# Patient Record
Sex: Male | Born: 1940 | Race: White | Hispanic: No | Marital: Married | Smoking: Former smoker
Health system: Southern US, Community
[De-identification: ages and names within clinical notes are randomized; demographics above are authoritative.]

## PROBLEM LIST (undated history)

## (undated) DIAGNOSIS — I10 Essential (primary) hypertension: Secondary | ICD-10-CM

## (undated) DIAGNOSIS — E1140 Type 2 diabetes mellitus with diabetic neuropathy, unspecified: Secondary | ICD-10-CM

## (undated) DIAGNOSIS — C9590 Leukemia, unspecified not having achieved remission: Secondary | ICD-10-CM

## (undated) DIAGNOSIS — R339 Retention of urine, unspecified: Secondary | ICD-10-CM

## (undated) DIAGNOSIS — E785 Hyperlipidemia, unspecified: Secondary | ICD-10-CM

## (undated) DIAGNOSIS — A4902 Methicillin resistant Staphylococcus aureus infection, unspecified site: Secondary | ICD-10-CM

## (undated) HISTORY — DX: Retention of urine, unspecified: R33.9

## (undated) HISTORY — DX: Essential (primary) hypertension: I10

## (undated) HISTORY — PX: EYE SURGERY: SHX253

## (undated) HISTORY — PX: BACK SURGERY: SHX140

## (undated) HISTORY — DX: Type 2 diabetes mellitus with diabetic neuropathy, unspecified: E11.40

## (undated) HISTORY — PX: APPENDECTOMY: SHX54

## (undated) HISTORY — DX: Hyperlipidemia, unspecified: E78.5

## (undated) HISTORY — DX: Leukemia, unspecified not having achieved remission: C95.90

## (undated) HISTORY — PX: CHOLECYSTECTOMY: SHX55

## (undated) HISTORY — DX: Methicillin resistant Staphylococcus aureus infection, unspecified site: A49.02

---

## 2004-05-26 ENCOUNTER — Ambulatory Visit (HOSPITAL_COMMUNITY): Admission: RE | Admit: 2004-05-26 | Discharge: 2004-05-27 | Payer: Self-pay | Admitting: Ophthalmology

## 2012-11-29 DIAGNOSIS — N320 Bladder-neck obstruction: Secondary | ICD-10-CM | POA: Insufficient documentation

## 2013-01-20 DIAGNOSIS — J309 Allergic rhinitis, unspecified: Secondary | ICD-10-CM | POA: Insufficient documentation

## 2013-01-20 DIAGNOSIS — H3320 Serous retinal detachment, unspecified eye: Secondary | ICD-10-CM | POA: Insufficient documentation

## 2013-01-20 DIAGNOSIS — R32 Unspecified urinary incontinence: Secondary | ICD-10-CM | POA: Insufficient documentation

## 2013-01-20 DIAGNOSIS — F411 Generalized anxiety disorder: Secondary | ICD-10-CM | POA: Insufficient documentation

## 2013-01-20 DIAGNOSIS — G5600 Carpal tunnel syndrome, unspecified upper limb: Secondary | ICD-10-CM | POA: Insufficient documentation

## 2013-01-20 DIAGNOSIS — G2581 Restless legs syndrome: Secondary | ICD-10-CM | POA: Insufficient documentation

## 2013-01-20 DIAGNOSIS — E1142 Type 2 diabetes mellitus with diabetic polyneuropathy: Secondary | ICD-10-CM | POA: Insufficient documentation

## 2013-01-20 DIAGNOSIS — M47812 Spondylosis without myelopathy or radiculopathy, cervical region: Secondary | ICD-10-CM | POA: Insufficient documentation

## 2013-01-20 DIAGNOSIS — K219 Gastro-esophageal reflux disease without esophagitis: Secondary | ICD-10-CM | POA: Insufficient documentation

## 2013-01-20 DIAGNOSIS — K5730 Diverticulosis of large intestine without perforation or abscess without bleeding: Secondary | ICD-10-CM | POA: Insufficient documentation

## 2013-01-20 DIAGNOSIS — C9210 Chronic myeloid leukemia, BCR/ABL-positive, not having achieved remission: Secondary | ICD-10-CM | POA: Insufficient documentation

## 2013-07-31 DIAGNOSIS — R42 Dizziness and giddiness: Secondary | ICD-10-CM | POA: Insufficient documentation

## 2013-10-12 DIAGNOSIS — D638 Anemia in other chronic diseases classified elsewhere: Secondary | ICD-10-CM | POA: Insufficient documentation

## 2013-11-05 DIAGNOSIS — I10 Essential (primary) hypertension: Secondary | ICD-10-CM | POA: Insufficient documentation

## 2013-11-05 DIAGNOSIS — R609 Edema, unspecified: Secondary | ICD-10-CM | POA: Insufficient documentation

## 2014-01-11 DIAGNOSIS — R339 Retention of urine, unspecified: Secondary | ICD-10-CM

## 2014-01-11 HISTORY — DX: Retention of urine, unspecified: R33.9

## 2014-05-11 DIAGNOSIS — N400 Enlarged prostate without lower urinary tract symptoms: Secondary | ICD-10-CM | POA: Insufficient documentation

## 2014-07-26 DIAGNOSIS — I509 Heart failure, unspecified: Secondary | ICD-10-CM | POA: Insufficient documentation

## 2014-07-26 DIAGNOSIS — I2510 Atherosclerotic heart disease of native coronary artery without angina pectoris: Secondary | ICD-10-CM | POA: Insufficient documentation

## 2014-07-26 DIAGNOSIS — I214 Non-ST elevation (NSTEMI) myocardial infarction: Secondary | ICD-10-CM | POA: Insufficient documentation

## 2014-09-25 DIAGNOSIS — R296 Repeated falls: Secondary | ICD-10-CM | POA: Insufficient documentation

## 2014-11-05 DIAGNOSIS — M545 Low back pain, unspecified: Secondary | ICD-10-CM | POA: Insufficient documentation

## 2015-04-15 DIAGNOSIS — M5416 Radiculopathy, lumbar region: Secondary | ICD-10-CM | POA: Insufficient documentation

## 2015-05-13 DIAGNOSIS — Z794 Long term (current) use of insulin: Secondary | ICD-10-CM | POA: Insufficient documentation

## 2015-05-13 DIAGNOSIS — N183 Chronic kidney disease, stage 3 unspecified: Secondary | ICD-10-CM | POA: Insufficient documentation

## 2015-08-05 DIAGNOSIS — E559 Vitamin D deficiency, unspecified: Secondary | ICD-10-CM | POA: Insufficient documentation

## 2015-09-15 DIAGNOSIS — E1165 Type 2 diabetes mellitus with hyperglycemia: Secondary | ICD-10-CM | POA: Insufficient documentation

## 2015-09-15 DIAGNOSIS — Y92009 Unspecified place in unspecified non-institutional (private) residence as the place of occurrence of the external cause: Secondary | ICD-10-CM

## 2015-09-15 DIAGNOSIS — L03119 Cellulitis of unspecified part of limb: Secondary | ICD-10-CM | POA: Insufficient documentation

## 2015-09-15 DIAGNOSIS — M791 Myalgia, unspecified site: Secondary | ICD-10-CM | POA: Insufficient documentation

## 2015-09-15 DIAGNOSIS — W19XXXA Unspecified fall, initial encounter: Secondary | ICD-10-CM | POA: Insufficient documentation

## 2015-09-16 HISTORY — PX: INCISE AND DRAIN ABCESS: PRO64

## 2015-09-19 DIAGNOSIS — A4902 Methicillin resistant Staphylococcus aureus infection, unspecified site: Secondary | ICD-10-CM

## 2015-09-19 HISTORY — DX: Methicillin resistant Staphylococcus aureus infection, unspecified site: A49.02

## 2015-09-19 HISTORY — PX: INCISION AND DRAINAGE ABSCESS: SHX5864

## 2015-09-25 ENCOUNTER — Encounter: Payer: Self-pay | Admitting: Internal Medicine

## 2015-09-25 ENCOUNTER — Non-Acute Institutional Stay (SKILLED_NURSING_FACILITY): Payer: Medicare Other | Admitting: Internal Medicine

## 2015-09-25 DIAGNOSIS — C9210 Chronic myeloid leukemia, BCR/ABL-positive, not having achieved remission: Secondary | ICD-10-CM

## 2015-09-25 DIAGNOSIS — D638 Anemia in other chronic diseases classified elsewhere: Secondary | ICD-10-CM | POA: Diagnosis not present

## 2015-09-25 DIAGNOSIS — F411 Generalized anxiety disorder: Secondary | ICD-10-CM

## 2015-09-25 DIAGNOSIS — L02419 Cutaneous abscess of limb, unspecified: Secondary | ICD-10-CM | POA: Diagnosis not present

## 2015-09-25 DIAGNOSIS — E785 Hyperlipidemia, unspecified: Secondary | ICD-10-CM | POA: Diagnosis not present

## 2015-09-25 DIAGNOSIS — Y92009 Unspecified place in unspecified non-institutional (private) residence as the place of occurrence of the external cause: Secondary | ICD-10-CM

## 2015-09-25 DIAGNOSIS — N401 Enlarged prostate with lower urinary tract symptoms: Secondary | ICD-10-CM | POA: Diagnosis not present

## 2015-09-25 DIAGNOSIS — J301 Allergic rhinitis due to pollen: Secondary | ICD-10-CM

## 2015-09-25 DIAGNOSIS — A4902 Methicillin resistant Staphylococcus aureus infection, unspecified site: Secondary | ICD-10-CM

## 2015-09-25 DIAGNOSIS — E1165 Type 2 diabetes mellitus with hyperglycemia: Secondary | ICD-10-CM

## 2015-09-25 DIAGNOSIS — L03114 Cellulitis of left upper limb: Secondary | ICD-10-CM | POA: Diagnosis not present

## 2015-09-25 DIAGNOSIS — Z794 Long term (current) use of insulin: Secondary | ICD-10-CM

## 2015-09-25 DIAGNOSIS — K219 Gastro-esophageal reflux disease without esophagitis: Secondary | ICD-10-CM | POA: Diagnosis not present

## 2015-09-25 DIAGNOSIS — W19XXXA Unspecified fall, initial encounter: Secondary | ICD-10-CM

## 2015-09-25 DIAGNOSIS — N138 Other obstructive and reflux uropathy: Secondary | ICD-10-CM

## 2015-09-25 DIAGNOSIS — R079 Chest pain, unspecified: Secondary | ICD-10-CM | POA: Insufficient documentation

## 2015-09-25 DIAGNOSIS — Y92099 Unspecified place in other non-institutional residence as the place of occurrence of the external cause: Secondary | ICD-10-CM

## 2015-09-25 NOTE — Progress Notes (Signed)
Patient ID: Jerry [name], male   DOB: June 18, 1941, 75 y.o.   MRN: [id number]    HISTORY AND PHYSICAL   DATE: 09/25/15  Location:  [facility]    Place of Service: SNF (31)   Extended Emergency Contact Information Primary Emergency Contact: Losh,HELEN Address: [address]          [place],  [place] Home Phone: [phone] Relation: None  Advanced Directive information  FULL CODE  Chief Complaint  Patient presents with  . New Admit To SNF    HPI:  75 yo male seen today as a new admission into SNF following hospital stay at [facility] for left arm cellulitis/left forearm abscess, CLL, DM, CHF, CKD stage 3, fall at home, atypical CP, mild UTI. He had I&D x 2. Wound cx (+) MRSA and tx with IV vanco ---> po doxy x 2 weeks. Wound vac placed. ID was consulted. CP though to be demand ischemia. He was given 1 unit PRBCs for anemia. hgb 8.3 at d/c. UTI tx with macrodantin and was told he needs o/p urology f/u. He has CLL and oncology was consulted. Albumin 2.2 at d/c. Cr 0.87. brilinta stopped  He reports no f/c or arm pain. Taking all meds as ordered. He has acid reflux but states he prefers to take pepto bismul. No CP. No nursing issues. No falls.  DM - insulin dependent. CBG 235 today. No low BS reactions. He takes levemir and humalog SSI. Followed by Endo Dr Meredith Pel  HTN - BP controlled on metoprolol  CAD/hx CHF - stable on ASA, BB, statin, lasix with K+ supplement. Has not needed prn SLNTG. Followed by cardio at [facility] in [place]  Hyperlipidemia - stable on lipitor and fenofibrate  CLL - followed by oncology Dr Pearson Forster. Takes sprycel  Anemia of chronic disease - stable s/p 1 unit PRBC  CKD - last Cr nml  GERD - takes omeprazole  Seasonal allergy - takes singulair and has used flonase at home  Anxiety- stable on buspar  BPH - stable on proscar and flomax  Past Medical History  Diagnosis Date  . Urinary retention 01/11/2014  . Diabetes mellitus without complication  ([place])   . Hypertension   . Hyperlipidemia   . MRSA infection 09/19/2015    left arm  . Leukemia [facility])     Past Surgical History  Procedure Laterality Date  . Appendectomy    . Cholecystectomy    . Eye surgery    . Back surgery    . Incise and drain abcess Left 09/16/2015    forearm; debscess undere MAC; [facility]; Dr Curley Spice  . Incision and drainage abscess Left 09/19/15    forearm wound washout with pulavac at [facility] by Dr Curley Spice    No care team member to display  Social History   Social History  . Marital Status: Married    Spouse Name: N/A  . Number of Children: N/A  . Years of Education: N/A   Occupational History  . Not on file.   Social History Main Topics  . Smoking status: Former Research scientist (life sciences)  . Smokeless tobacco: Not on file  . Alcohol Use: No  . Drug Use: No  . Sexual Activity: Not on file   Other Topics Concern  . Not on file   Social History Narrative   Lives alone     reports that he has quit smoking. He does not have any smokeless tobacco history on file. He reports that he does  not drink alcohol or use illicit drugs.  Family History  Problem Relation Age of Onset  . Heart attack    . Diabetes    . Hypertension     No family status information on file.     There is no immunization history on file for this patient.  Allergies  Allergen Reactions  . Influenza Vaccines Hives  . Aspirin     Other reaction(s): DIZZINESS Other reaction(s): UNCONSCIOUSNESS 09/16/15 pt takes 81 mg aspirin at home.[name], RPh  . Cyclobenzaprine     Other reaction(s): HIVES  . Dextromethorphan   . Doxycycline     Other reaction(s): NAUSEA  . Flu Virus Vaccine   . Gabapentin     Other reaction(s): GI UPSET,DROWSINESS  . Glipizide     Other reaction(s): DIZZINESS  . Hydrocodone-Acetaminophen   . Iodinated Diagnostic Agents     Other reaction(s): NO ALLERGY  . Latex     Other reaction(s): NO ALLERGY  . Lisinopril     Other reaction(s):  COUGH  . Penicillins     Other reaction(s): PRURITUS, RASH Other reaction(s): HIVES  . Pregabalin     Other reaction(s): GI UPSET,DROWSINESS  . Ropinirole     Other reaction(s): NAUSEA  . Salicylates   . Sulfamethoxazole-Trimethoprim     Other reaction(s): NAUSEA  . Oxycodone-Acetaminophen Nausea Only    Other reaction(s): HIVES    Medications: Patient's Medications  New Prescriptions   No medications on file  Previous Medications   ACETAMINOPHEN (TYLENOL) 325 MG TABLET    Take 325 mg by mouth.   ASPIRIN EC 81 MG TABLET    Take 81 mg by mouth.   ATORVASTATIN (LIPITOR) 80 MG TABLET    Take 80 mg by mouth.   BUSPIRONE (BUSPAR) 5 MG TABLET       CETIRIZINE (ZYRTEC) 10 MG TABLET       DASATINIB (SPRYCEL) 50 MG TABLET    Take 50 mg by mouth.   DOXYCYCLINE (VIBRA-TABS) 100 MG TABLET    Take 100 mg by mouth.   FENOFIBRATE 160 MG TABLET       FINASTERIDE (PROSCAR) 5 MG TABLET    Take 5 mg by mouth.   FLUTICASONE (FLONASE) 50 MCG/ACT NASAL SPRAY       FUROSEMIDE (LASIX) 20 MG TABLET    Take 20 mg by mouth.   INSULIN DETEMIR (LEVEMIR) 100 UNIT/ML PEN       INSULIN LISPRO (HUMALOG) 100 UNIT/ML INJECTION    Inject into the skin.   METOPROLOL SUCCINATE (TOPROL-XL) 25 MG 24 HR TABLET       MONTELUKAST (SINGULAIR) 10 MG TABLET       NITROGLYCERIN (NITROSTAT) 0.4 MG SL TABLET    Place 0.4 mg under the tongue.   OMEPRAZOLE (PRILOSEC) 40 MG CAPSULE       POTASSIUM CHLORIDE SA (KLOR-CON M20) 20 MEQ TABLET    Take by mouth.   PROMETHAZINE (PHENERGAN) 25 MG TABLET    Take 25 mg by mouth.   TAMSULOSIN (FLOMAX) 0.4 MG CAPS CAPSULE    Take 0.4 mg by mouth.   VITAMIN D, ERGOCALCIFEROL, (DRISDOL) 50000 UNITS CAPS CAPSULE    Take by mouth.  Modified Medications   No medications on file  Discontinued Medications   No medications on file    Review of Systems  Skin: Positive for wound.  All other systems reviewed and are negative.   Filed Vitals:   09/25/15 1100  BP: 108/56  Pulse: 78  Temp: 97.3 F (36.3 C)  SpO2: 99%   There is no height or weight on file to calculate BMI.  Physical Exam  Constitutional: He is oriented to person, place, and time. He appears well-developed.  Frail appearing in NAD, sitting up in bed  HENT:  Mouth/Throat: Oropharynx is clear and moist.  Eyes: Pupils are equal, round, and reactive to light. No scleral icterus.  Neck: Neck supple. Carotid bruit is not present. No thyromegaly present.  Cardiovascular: Normal rate, regular rhythm, normal heart sounds and intact distal pulses.  Exam reveals no gallop and no friction rub.   No murmur heard. Trace LE edema b/l. No calf TTP  Pulmonary/Chest: Effort normal and breath sounds normal. He has no wheezes. He has no rales. He exhibits no tenderness.  Abdominal: Soft. Bowel sounds are normal. He exhibits no distension, no abdominal bruit, no pulsatile midline mass and no mass. There is tenderness (epigastric). There is no rebound and no guarding.  Musculoskeletal: He exhibits edema.  Lymphadenopathy:    He has no cervical adenopathy.  Neurological: He is alert and oriented to person, place, and time.  Skin: Skin is warm and dry. No rash noted.  Left forearm dressing c/d/i. Min redness at wound vac insertion site. No d/c. Wound vac suctioning  Psychiatric: He has a normal mood and affect. His behavior is normal. Judgment and thought content normal.     Labs reviewed: No results found for any previous visit.  No results found.   Assessment/Plan   ICD-9-CM ICD-10-CM   1. Abscess of forearm 682.3 L02.419   2. Cellulitis of left upper extremity 682.3 L03.114   3. Infection with methicillin-resistant Staphylococcus aureus 041.12 A49.02   4. Type 2 diabetes mellitus with hyperglycemia, with long-term current use of insulin (HCC) 250.00 E11.65    790.29 Z79.4    V58.67    5. Fall in home, initial encounter [phone] W19.XXXA    E849.0 Y92.099   6. Chronic myeloid leukemia (HCC) 205.10 C92.10    7. BPH (benign prostatic hypertrophy) with urinary obstruction 600.01 N40.1    599.69    8. Allergic rhinitis due to pollen 477.0 J30.1   9. Gastroesophageal reflux disease without esophagitis 530.81 K21.9   10. Diabetic polyneuropathy associated with type 2 diabetes mellitus (HCC) 250.60 E11.42    357.2    11. Generalized anxiety disorder 300.02 F41.1   12. Hyperlipidemia LDL goal <100 272.4 E78.5   13. CAD in native artery 414.01 I25.10     Add flonase to [facility] if not taking already  Check CBC , BMP  Cont current meds as ordered. Doxy to take BID x 2 weeks total  F/u with specialists as scheduled  Wound care as ordered. Wound vac mx  Contact isolation due to mrsa infection  PT/OT/ST as ordered  GOAL: short term rehab and d/c home when medically appropriate. Communicated with pt and nursing.  Will follow  Bertine Schlottman S. Perlie Gold  [facility] and [facility] [address] [place], [place] [address] [phone] Cell (Monday-Friday 8 AM - 5 PM) [phone] After 5 PM and follow prompts

## 2015-10-06 ENCOUNTER — Encounter: Payer: Self-pay | Admitting: Internal Medicine

## 2015-10-13 ENCOUNTER — Non-Acute Institutional Stay (SKILLED_NURSING_FACILITY): Payer: Medicare Other | Admitting: Internal Medicine

## 2015-10-13 ENCOUNTER — Encounter: Payer: Self-pay | Admitting: Internal Medicine

## 2015-10-13 DIAGNOSIS — J301 Allergic rhinitis due to pollen: Secondary | ICD-10-CM | POA: Diagnosis not present

## 2015-10-13 DIAGNOSIS — A4902 Methicillin resistant Staphylococcus aureus infection, unspecified site: Secondary | ICD-10-CM

## 2015-10-13 DIAGNOSIS — C9210 Chronic myeloid leukemia, BCR/ABL-positive, not having achieved remission: Secondary | ICD-10-CM | POA: Diagnosis not present

## 2015-10-13 DIAGNOSIS — F411 Generalized anxiety disorder: Secondary | ICD-10-CM | POA: Diagnosis not present

## 2015-10-13 DIAGNOSIS — K219 Gastro-esophageal reflux disease without esophagitis: Secondary | ICD-10-CM

## 2015-10-13 DIAGNOSIS — E1165 Type 2 diabetes mellitus with hyperglycemia: Secondary | ICD-10-CM | POA: Diagnosis not present

## 2015-10-13 DIAGNOSIS — L03114 Cellulitis of left upper limb: Secondary | ICD-10-CM | POA: Diagnosis not present

## 2015-10-13 DIAGNOSIS — W19XXXA Unspecified fall, initial encounter: Secondary | ICD-10-CM

## 2015-10-13 DIAGNOSIS — D638 Anemia in other chronic diseases classified elsewhere: Secondary | ICD-10-CM | POA: Diagnosis not present

## 2015-10-13 DIAGNOSIS — E785 Hyperlipidemia, unspecified: Secondary | ICD-10-CM | POA: Diagnosis not present

## 2015-10-13 DIAGNOSIS — N138 Other obstructive and reflux uropathy: Secondary | ICD-10-CM

## 2015-10-13 DIAGNOSIS — Z794 Long term (current) use of insulin: Secondary | ICD-10-CM | POA: Diagnosis not present

## 2015-10-13 DIAGNOSIS — N401 Enlarged prostate with lower urinary tract symptoms: Secondary | ICD-10-CM | POA: Diagnosis not present

## 2015-10-13 DIAGNOSIS — Y92009 Unspecified place in unspecified non-institutional (private) residence as the place of occurrence of the external cause: Secondary | ICD-10-CM

## 2015-10-13 DIAGNOSIS — Y92099 Unspecified place in other non-institutional residence as the place of occurrence of the external cause: Secondary | ICD-10-CM

## 2015-10-13 NOTE — Progress Notes (Signed)
Patient ID: Jerry [name], male   DOB: 1941-01-16, 75 y.o.   MRN: [id number]    DATE:  Location:  [facility]    Place of Service: SNF (31)   Extended Emergency Contact Information Primary Emergency Contact: Boxley,Helen Address: [address]          [place] [address] [place] Phone: [phone] Relation: Other Secondary Emergency Contact: Radabaugh,[name] Address: [place], [place] [address] [place] Phone: [phone] Mobile Phone: [phone] Relation: Son  Advanced Directive information    Chief Complaint  Patient presents with  . Discharge Note    HPI:  75 yo male seen today for d/c from SNF following short term rehab for  left arm cellulitis/left forearm abscess, CLL, DM, CHF, CKD stage 3, fall at home, atypical CP, mild UTI. He had x 2 I&D. He saw surgery today and wound vac d/c'd. He has f/u appt next week with surgery. He will need HH PT/OT and wound care. He is a candidate for PCS and form has been completed. DME needed: rollator. He is excited about going home. No nursing issues. No falls. He has tolerated PT/OT. He has been getting nutritional supplements. He does get an "upset stomach" at times. He completed ALL abx (doxy and macrodantin) prior to d/c from SNF  [place]: Wound cx (+) MRSA and tx with IV vanco ---> po doxy x 2 weeks. Wound vac placed. ID was consulted. CP though to be demand ischemia. He was given 1 unit PRBCs for anemia. hgb 8.3 at d/c. UTI tx with macrodantin and was told he needs o/p urology f/u. He has CLL and oncology was consulted. Albumin 2.2 at d/c. Cr 0.87. brilinta stopped  M - insulin dependent. CBG 235 today. No low BS reactions. He takes levemir and humalog SSI. Followed by Endo Dr Meredith Pel  HTN - BP controlled on metoprolol  CAD/hx CHF - stable on ASA, BB, statin, lasix with K+ supplement. Has not needed prn SLNTG. Followed by cardio  at [facility] in [place]  Hyperlipidemia - stable on lipitor and fenofibrate  CLL - followed by oncology Dr Pearson Forster. Takes sprycel  Anemia of chronic disease - stable s/p 1 unit PRBC  CKD - last Cr nml  GERD - takes omeprazole  Seasonal allergy - takes singulair and has used flonase at home  Anxiety- stable on buspar  BPH - stable on proscar and flomax  Past Medical History  Diagnosis Date  . Urinary retention 01/11/2014  . Diabetes mellitus without complication ([place])   . Hypertension   . Hyperlipidemia   . MRSA infection 09/19/2015    left arm  . Leukemia [facility])     Past Surgical History  Procedure Laterality Date  . Appendectomy    . Cholecystectomy    . Eye surgery    . Back surgery    . Incise and drain abcess Left 09/16/2015    forearm; debscess undere MAC; [facility]; Dr Curley Spice  . Incision and drainage abscess Left 09/19/15    forearm wound washout with pulavac at [facility] by Dr Curley Spice    No care team member to display  Social History   Social History  . Marital Status: Married    Spouse Name: N/A  . Number of Children: N/A  . Years of Education: N/A   Occupational History  . Not on file.  Social History Main Topics  . Smoking status: Former Research scientist (life sciences)  . Smokeless tobacco: Not on file  . Alcohol Use: No  . Drug Use: No  . Sexual Activity: Not on file   Other Topics Concern  . Not on file   Social History Narrative   Lives alone     reports that he has quit smoking. He does not have any smokeless tobacco history on file. He reports that he does not drink alcohol or use illicit drugs.   There is no immunization history on file for this patient.  Allergies  Allergen Reactions  . Influenza Vaccines Hives  . Aspirin     Other reaction(s): DIZZINESS Other reaction(s): UNCONSCIOUSNESS 09/16/15 pt takes 81 mg aspirin at home.[name], RPh  . Cyclobenzaprine     Other reaction(s): HIVES  . Dextromethorphan   . Doxycycline     Other  reaction(s): NAUSEA  . Flu Virus Vaccine   . Gabapentin     Other reaction(s): GI UPSET,DROWSINESS  . Glipizide     Other reaction(s): DIZZINESS  . Hydrocodone-Acetaminophen   . Iodinated Diagnostic Agents     Other reaction(s): NO ALLERGY  . Latex     Other reaction(s): NO ALLERGY  . Lisinopril     Other reaction(s): COUGH  . Penicillins     Other reaction(s): PRURITUS, RASH Other reaction(s): HIVES  . Pregabalin     Other reaction(s): GI UPSET,DROWSINESS  . Ropinirole     Other reaction(s): NAUSEA  . Salicylates   . Sulfamethoxazole-Trimethoprim     Other reaction(s): NAUSEA  . Oxycodone-Acetaminophen Nausea Only    Other reaction(s): HIVES    Medications: Patient's Medications  New Prescriptions   No medications on file  Previous Medications   ACETAMINOPHEN (TYLENOL) 325 MG TABLET    Take 325 mg by mouth.   ASPIRIN EC 81 MG TABLET    Take 81 mg by mouth.   ATORVASTATIN (LIPITOR) 80 MG TABLET    Take 80 mg by mouth.   BUSPIRONE (BUSPAR) 5 MG TABLET       CETIRIZINE (ZYRTEC) 10 MG TABLET       DASATINIB (SPRYCEL) 50 MG TABLET    Take 50 mg by mouth.   FENOFIBRATE 160 MG TABLET       FINASTERIDE (PROSCAR) 5 MG TABLET    Take 5 mg by mouth.   FLUTICASONE (FLONASE) 50 MCG/ACT NASAL SPRAY       FUROSEMIDE (LASIX) 20 MG TABLET    Take 20 mg by mouth.   INSULIN DETEMIR (LEVEMIR) 100 UNIT/ML PEN       INSULIN LISPRO (HUMALOG) 100 UNIT/ML INJECTION    Inject into the skin.   METOPROLOL SUCCINATE (TOPROL-XL) 25 MG 24 HR TABLET       MONTELUKAST (SINGULAIR) 10 MG TABLET       NITROGLYCERIN (NITROSTAT) 0.4 MG SL TABLET    Place 0.4 mg under the tongue.   OMEPRAZOLE (PRILOSEC) 40 MG CAPSULE       POTASSIUM CHLORIDE SA (KLOR-CON M20) 20 MEQ TABLET    Take by mouth.   PROMETHAZINE (PHENERGAN) 25 MG TABLET    Take 25 mg by mouth.   TAMSULOSIN (FLOMAX) 0.4 MG CAPS CAPSULE    Take 0.4 mg by mouth.   VITAMIN D, ERGOCALCIFEROL, (DRISDOL) 50000 UNITS CAPS CAPSULE    Take by mouth.    Modified Medications   No medications on file  Discontinued Medications   No medications on file    Review of  Systems  Gastrointestinal:       Indigestion  Musculoskeletal: Positive for arthralgias and gait problem.  Allergic/Immunologic: Positive for environmental allergies.  All other systems reviewed and are negative.   Filed Vitals:   10/13/15 1628  BP: 119/65  Pulse: 50  Temp: 97 F (36.1 C)   There is no height or weight on file to calculate BMI.  Physical Exam  Constitutional: He is oriented to person, place, and time. He appears well-developed.  Frail appearing in NAD sitting in chair at bedside  Neurological: He is alert and oriented to person, place, and time.  Skin: Skin is warm and dry.  Left forearm dressing c/d/i  Psychiatric: He has a normal mood and affect. His behavior is normal. Judgment and thought content normal.     Labs reviewed: No results found for any previous visit.  No results found.   Assessment/Plan   ICD-9-CM ICD-10-CM   1. Cellulitis of left upper extremity 682.3 L03.114   2. Infection with methicillin-resistant Staphylococcus aureus 041.12 A49.02   3. Type 2 diabetes mellitus with hyperglycemia, with long-term current use of insulin (HCC) 250.00 E11.65    790.29 Z79.4    V58.67    4. Chronic myeloid leukemia (HCC) 205.10 C92.10   5. Gastroesophageal reflux disease without esophagitis 530.81 K21.9   6. BPH (benign prostatic hypertrophy) with urinary obstruction 600.01 N40.1    599.69    7. Seasonal allergic rhinitis due to pollen 477.0 J30.1   8. Generalized anxiety disorder 300.02 F41.1   9. Hyperlipidemia LDL goal <100 272.4 E78.5   10. Anemia in chronic illness 285.29 D63.8   11. Fall in home, initial encounter [phone] W19.Merril Abbe    [id number] Y92.099    Patient is being discharged with home health services:  PT/OT, wound care  Patient is being discharged with the following durable medical equipment:  rollater  Patient has been  advised to f/u with their PCP (Dr Ishmael Holter) in 1-2 weeks to bring them up to date on their rehab stay.  They were provided with a 30 day supply of scripts for prescription medications and refills must be obtained from their PCP.  TIME SPENT (MINUTES): [place]. Perlie Gold  [facility] and Adult Medicine [address] [place], [place] [address] [phone] Cell (Monday-Friday 8 AM - 5 PM) [phone] After 5 PM and follow prompts

## 2018-07-28 ENCOUNTER — Emergency Department (HOSPITAL_COMMUNITY): Payer: Medicare Other

## 2018-07-28 ENCOUNTER — Encounter (HOSPITAL_COMMUNITY): Payer: Self-pay | Admitting: Emergency Medicine

## 2018-07-28 ENCOUNTER — Other Ambulatory Visit: Payer: Self-pay

## 2018-07-28 ENCOUNTER — Observation Stay (HOSPITAL_COMMUNITY)
Admission: EM | Admit: 2018-07-28 | Discharge: 2018-08-01 | Disposition: A | Discharge status: Skilled Nursing Facility | Payer: Medicare Other | Attending: Internal Medicine | Admitting: Internal Medicine

## 2018-07-28 DIAGNOSIS — Z880 Allergy status to penicillin: Secondary | ICD-10-CM | POA: Insufficient documentation

## 2018-07-28 DIAGNOSIS — E11649 Type 2 diabetes mellitus with hypoglycemia without coma: Secondary | ICD-10-CM

## 2018-07-28 DIAGNOSIS — E10649 Type 1 diabetes mellitus with hypoglycemia without coma: Secondary | ICD-10-CM | POA: Insufficient documentation

## 2018-07-28 DIAGNOSIS — N183 Chronic kidney disease, stage 3 unspecified: Secondary | ICD-10-CM | POA: Diagnosis present

## 2018-07-28 DIAGNOSIS — N400 Enlarged prostate without lower urinary tract symptoms: Secondary | ICD-10-CM | POA: Insufficient documentation

## 2018-07-28 DIAGNOSIS — E1065 Type 1 diabetes mellitus with hyperglycemia: Secondary | ICD-10-CM | POA: Insufficient documentation

## 2018-07-28 DIAGNOSIS — K219 Gastro-esophageal reflux disease without esophagitis: Secondary | ICD-10-CM | POA: Diagnosis not present

## 2018-07-28 DIAGNOSIS — A419 Sepsis, unspecified organism: Secondary | ICD-10-CM | POA: Diagnosis not present

## 2018-07-28 DIAGNOSIS — E876 Hypokalemia: Secondary | ICD-10-CM | POA: Insufficient documentation

## 2018-07-28 DIAGNOSIS — Z882 Allergy status to sulfonamides status: Secondary | ICD-10-CM | POA: Insufficient documentation

## 2018-07-28 DIAGNOSIS — E1143 Type 2 diabetes mellitus with diabetic autonomic (poly)neuropathy: Secondary | ICD-10-CM | POA: Diagnosis present

## 2018-07-28 DIAGNOSIS — D631 Anemia in chronic kidney disease: Secondary | ICD-10-CM | POA: Diagnosis not present

## 2018-07-28 DIAGNOSIS — Z885 Allergy status to narcotic agent status: Secondary | ICD-10-CM | POA: Insufficient documentation

## 2018-07-28 DIAGNOSIS — N179 Acute kidney failure, unspecified: Secondary | ICD-10-CM | POA: Insufficient documentation

## 2018-07-28 DIAGNOSIS — I130 Hypertensive heart and chronic kidney disease with heart failure and stage 1 through stage 4 chronic kidney disease, or unspecified chronic kidney disease: Secondary | ICD-10-CM | POA: Diagnosis not present

## 2018-07-28 DIAGNOSIS — R410 Disorientation, unspecified: Secondary | ICD-10-CM

## 2018-07-28 DIAGNOSIS — Z87891 Personal history of nicotine dependence: Secondary | ICD-10-CM | POA: Insufficient documentation

## 2018-07-28 DIAGNOSIS — I2510 Atherosclerotic heart disease of native coronary artery without angina pectoris: Secondary | ICD-10-CM | POA: Diagnosis not present

## 2018-07-28 DIAGNOSIS — Z9119 Patient's noncompliance with other medical treatment and regimen: Secondary | ICD-10-CM | POA: Insufficient documentation

## 2018-07-28 DIAGNOSIS — N178 Other acute kidney failure: Secondary | ICD-10-CM

## 2018-07-28 DIAGNOSIS — IMO0002 Reserved for concepts with insufficient information to code with codable children: Secondary | ICD-10-CM | POA: Diagnosis present

## 2018-07-28 DIAGNOSIS — E1042 Type 1 diabetes mellitus with diabetic polyneuropathy: Secondary | ICD-10-CM | POA: Insufficient documentation

## 2018-07-28 DIAGNOSIS — E1142 Type 2 diabetes mellitus with diabetic polyneuropathy: Secondary | ICD-10-CM | POA: Diagnosis present

## 2018-07-28 DIAGNOSIS — D6959 Other secondary thrombocytopenia: Secondary | ICD-10-CM | POA: Insufficient documentation

## 2018-07-28 DIAGNOSIS — G9341 Metabolic encephalopathy: Secondary | ICD-10-CM | POA: Diagnosis not present

## 2018-07-28 DIAGNOSIS — N3000 Acute cystitis without hematuria: Principal | ICD-10-CM | POA: Insufficient documentation

## 2018-07-28 DIAGNOSIS — G92 Toxic encephalopathy: Secondary | ICD-10-CM | POA: Insufficient documentation

## 2018-07-28 DIAGNOSIS — E785 Hyperlipidemia, unspecified: Secondary | ICD-10-CM | POA: Diagnosis not present

## 2018-07-28 DIAGNOSIS — C9210 Chronic myeloid leukemia, BCR/ABL-positive, not having achieved remission: Secondary | ICD-10-CM | POA: Diagnosis present

## 2018-07-28 DIAGNOSIS — E1022 Type 1 diabetes mellitus with diabetic chronic kidney disease: Secondary | ICD-10-CM | POA: Diagnosis not present

## 2018-07-28 DIAGNOSIS — Z794 Long term (current) use of insulin: Secondary | ICD-10-CM

## 2018-07-28 DIAGNOSIS — I5032 Chronic diastolic (congestive) heart failure: Secondary | ICD-10-CM | POA: Insufficient documentation

## 2018-07-28 DIAGNOSIS — Z7982 Long term (current) use of aspirin: Secondary | ICD-10-CM | POA: Insufficient documentation

## 2018-07-28 DIAGNOSIS — E1165 Type 2 diabetes mellitus with hyperglycemia: Secondary | ICD-10-CM

## 2018-07-28 DIAGNOSIS — Z7951 Long term (current) use of inhaled steroids: Secondary | ICD-10-CM | POA: Insufficient documentation

## 2018-07-28 DIAGNOSIS — I10 Essential (primary) hypertension: Secondary | ICD-10-CM

## 2018-07-28 DIAGNOSIS — F411 Generalized anxiety disorder: Secondary | ICD-10-CM | POA: Insufficient documentation

## 2018-07-28 DIAGNOSIS — R197 Diarrhea, unspecified: Secondary | ICD-10-CM | POA: Diagnosis not present

## 2018-07-28 DIAGNOSIS — N390 Urinary tract infection, site not specified: Secondary | ICD-10-CM

## 2018-07-28 LAB — CBC WITH DIFFERENTIAL/PLATELET
Abs Immature Granulocytes: 0.07 10*3/uL (ref 0.00–0.07)
Basophils Absolute: 0.1 10*3/uL (ref 0.0–0.1)
Basophils Relative: 1 %
Eosinophils Absolute: 0.2 10*3/uL (ref 0.0–0.5)
Eosinophils Relative: 2 %
HCT: 37.3 % — ABNORMAL LOW (ref 39.0–52.0)
Hemoglobin: 12.6 g/dL — ABNORMAL LOW (ref 13.0–17.0)
Immature Granulocytes: 1 %
Lymphocytes Relative: 19 %
Lymphs Abs: 2.6 10*3/uL (ref 0.7–4.0)
MCH: 28.3 pg (ref 26.0–34.0)
MCHC: 33.8 g/dL (ref 30.0–36.0)
MCV: 83.6 fL (ref 80.0–100.0)
Monocytes Absolute: 1.6 10*3/uL — ABNORMAL HIGH (ref 0.1–1.0)
Monocytes Relative: 12 %
Neutro Abs: 9 10*3/uL — ABNORMAL HIGH (ref 1.7–7.7)
Neutrophils Relative %: 65 %
Platelets: 275 10*3/uL (ref 150–400)
RBC: 4.46 MIL/uL (ref 4.22–5.81)
RDW: 12.9 % (ref 11.5–15.5)
WBC: 13.7 10*3/uL — ABNORMAL HIGH (ref 4.0–10.5)
nRBC: 0 % (ref 0.0–0.2)

## 2018-07-28 LAB — CREATININE, SERUM
Creatinine, Ser: 1.18 mg/dL (ref 0.61–1.24)
GFR calc Af Amer: >60 mL/min (ref >60)

## 2018-07-28 LAB — I-STAT CHEM 8, ED
BUN: 31 mg/dL — ABNORMAL HIGH (ref 8–23)
Calcium, Ion: 1.22 mmol/L (ref 1.15–1.40)
Chloride: 99 mmol/L (ref 98–111)
Creatinine, Ser: 1.4 mg/dL — ABNORMAL HIGH (ref 0.61–1.24)
Glucose, Bld: 44 mg/dL — CL (ref 70–99)
HCT: 37 % — ABNORMAL LOW (ref 39.0–52.0)
Hemoglobin: 12.6 g/dL — ABNORMAL LOW (ref 13.0–17.0)
Potassium: 3.1 mmol/L — ABNORMAL LOW (ref 3.5–5.1)
Sodium: 138 mmol/L (ref 135–145)
TCO2: 32 mmol/L (ref 22–32)

## 2018-07-28 LAB — HEMOGLOBIN A1C
Hgb A1c MFr Bld: 13.2 % — ABNORMAL HIGH (ref 4.8–5.6)
Mean Plasma Glucose: 332.14 mg/dL

## 2018-07-28 LAB — URINALYSIS, ROUTINE W REFLEX MICROSCOPIC
Bilirubin Urine: NEGATIVE
Glucose, UA: >=500 mg/dL — AB
Ketones, ur: NEGATIVE mg/dL
Nitrite: POSITIVE — AB
Protein, ur: 100 mg/dL — AB
Specific Gravity, Urine: 1.02 (ref 1.005–1.030)
WBC, UA: >50 WBC/hpf — ABNORMAL HIGH (ref 0–5)
pH: 5 (ref 5.0–8.0)

## 2018-07-28 LAB — MRSA PCR SCREENING: MRSA by PCR: NEGATIVE

## 2018-07-28 LAB — CBG MONITORING, ED
Glucose-Capillary: 75 mg/dL (ref 70–99)
Glucose-Capillary: 167 mg/dL — ABNORMAL HIGH (ref 70–99)
Glucose-Capillary: 39 mg/dL — CL (ref 70–99)

## 2018-07-28 LAB — GLUCOSE, CAPILLARY
Glucose-Capillary: 157 mg/dL — ABNORMAL HIGH (ref 70–99)
Glucose-Capillary: 184 mg/dL — ABNORMAL HIGH (ref 70–99)
Glucose-Capillary: 259 mg/dL — ABNORMAL HIGH (ref 70–99)
Glucose-Capillary: 328 mg/dL — ABNORMAL HIGH (ref 70–99)

## 2018-07-28 LAB — I-STAT TROPONIN, ED: Troponin i, poc: 0 ng/mL (ref 0.00–0.08)

## 2018-07-28 LAB — MAGNESIUM: Magnesium: 2.1 mg/dL (ref 1.7–2.4)

## 2018-07-28 LAB — COMPREHENSIVE METABOLIC PANEL
ALT: 22 U/L (ref 0–44)
AST: 32 U/L (ref 15–41)
Albumin: 4.4 g/dL (ref 3.5–5.0)
Alkaline Phosphatase: 101 U/L (ref 38–126)
Anion gap: 14 (ref 5–15)
BUN: 29 mg/dL — ABNORMAL HIGH (ref 8–23)
CO2: 26 mmol/L (ref 22–32)
Calcium: 9.4 mg/dL (ref 8.9–10.3)
Chloride: 97 mmol/L — ABNORMAL LOW (ref 98–111)
Creatinine, Ser: 1.38 mg/dL — ABNORMAL HIGH (ref 0.61–1.24)
GFR calc Af Amer: 58 mL/min — ABNORMAL LOW (ref >60)
GFR calc non Af Amer: 50 mL/min — ABNORMAL LOW (ref >60)
Glucose, Bld: 49 mg/dL — ABNORMAL LOW (ref 70–99)
Potassium: 3.1 mmol/L — ABNORMAL LOW (ref 3.5–5.1)
Sodium: 137 mmol/L (ref 135–145)
Total Bilirubin: 0.9 mg/dL (ref 0.3–1.2)
Total Protein: 8.2 g/dL — ABNORMAL HIGH (ref 6.5–8.1)

## 2018-07-28 LAB — TSH
TSH: 7.402 u[IU]/mL — AB (ref 0.350–4.500)
TSH: 2.322 u[IU]/mL (ref 0.350–4.500)

## 2018-07-28 LAB — CBC
HCT: 35.4 % — ABNORMAL LOW (ref 39.0–52.0)
Hemoglobin: 12.1 g/dL — ABNORMAL LOW (ref 13.0–17.0)
MCH: 28.5 pg (ref 26.0–34.0)
MCHC: 34.2 g/dL (ref 30.0–36.0)
MCV: 83.5 fL (ref 80.0–100.0)
Platelets: 203 10*3/uL (ref 150–400)
RBC: 4.24 MIL/uL (ref 4.22–5.81)
RDW: 13 % (ref 11.5–15.5)
WBC: 17.5 10*3/uL — AB (ref 4.0–10.5)
nRBC: 0 % (ref 0.0–0.2)

## 2018-07-28 LAB — I-STAT CG4 LACTIC ACID, ED
Lactic Acid, Venous: 4.28 mmol/L (ref 0.5–1.9)
Lactic Acid, Venous: 1.79 mmol/L (ref 0.5–1.9)

## 2018-07-28 LAB — LIPASE, BLOOD: Lipase: 54 U/L — ABNORMAL HIGH (ref 11–51)

## 2018-07-28 LAB — T4, FREE: Free T4: 1.19 ng/dL (ref 0.82–1.77)

## 2018-07-28 MED ORDER — LORAZEPAM 2 MG/ML IJ SOLN
1.0000 mg | Freq: Once | INTRAMUSCULAR | Status: AC
  Administered 2018-07-28: 1 mg via INTRAVENOUS
  Filled 2018-07-28: qty 1

## 2018-07-28 MED ORDER — POTASSIUM CHLORIDE CRYS ER 20 MEQ PO TBCR
40.0000 meq | EXTENDED_RELEASE_TABLET | Freq: Every day | ORAL | Status: DC
  Administered 2018-07-28 – 2018-07-30 (×3): 40 meq via ORAL
  Filled 2018-07-28 (×3): qty 2

## 2018-07-28 MED ORDER — SODIUM CHLORIDE 0.9 % IV SOLN
1.0000 g | INTRAVENOUS | Status: DC
  Administered 2018-07-28 – 2018-07-29 (×2): 1 g via INTRAVENOUS
  Filled 2018-07-28 (×2): qty 1
  Filled 2018-07-28: qty 10

## 2018-07-28 MED ORDER — ACETAMINOPHEN 325 MG PO TABS
650.0000 mg | ORAL_TABLET | Freq: Four times a day (QID) | ORAL | Status: DC | PRN
  Administered 2018-07-28 – 2018-08-01 (×7): 650 mg via ORAL
  Filled 2018-07-28 (×7): qty 2

## 2018-07-28 MED ORDER — DEXTROSE 50 % IV SOLN
50.0000 mL | Freq: Once | INTRAVENOUS | Status: AC
  Administered 2018-07-28: 50 mL via INTRAVENOUS
  Filled 2018-07-28: qty 50

## 2018-07-28 MED ORDER — ONDANSETRON HCL 4 MG/2ML IJ SOLN
4.0000 mg | Freq: Four times a day (QID) | INTRAMUSCULAR | Status: DC | PRN
  Administered 2018-07-31: 4 mg via INTRAVENOUS
  Filled 2018-07-28: qty 2

## 2018-07-28 MED ORDER — ENOXAPARIN SODIUM 40 MG/0.4ML ~~LOC~~ SOLN
40.0000 mg | SUBCUTANEOUS | Status: DC
  Administered 2018-07-28 – 2018-08-01 (×5): 40 mg via SUBCUTANEOUS
  Filled 2018-07-28 (×5): qty 0.4

## 2018-07-28 MED ORDER — DEXTROSE 50 % IV SOLN
INTRAVENOUS | Status: AC
  Filled 2018-07-28: qty 50

## 2018-07-28 MED ORDER — SODIUM CHLORIDE 0.9 % IV SOLN
1.0000 g | Freq: Once | INTRAVENOUS | Status: AC
  Administered 2018-07-28: 1 g via INTRAVENOUS
  Filled 2018-07-28: qty 1

## 2018-07-28 MED ORDER — SODIUM CHLORIDE 0.9 % IV BOLUS
1000.0000 mL | Freq: Once | INTRAVENOUS | Status: AC
  Administered 2018-07-28: 1000 mL via INTRAVENOUS

## 2018-07-28 MED ORDER — PANTOPRAZOLE SODIUM 40 MG PO TBEC
40.0000 mg | DELAYED_RELEASE_TABLET | Freq: Every day | ORAL | Status: DC
  Administered 2018-07-28 – 2018-08-01 (×5): 40 mg via ORAL
  Filled 2018-07-28 (×5): qty 1

## 2018-07-28 MED ORDER — ACETAMINOPHEN 650 MG RE SUPP: 650.0000 mg | Freq: Four times a day (QID) | RECTAL | Status: DC | PRN

## 2018-07-28 MED ORDER — INSULIN ASPART 100 UNIT/ML ~~LOC~~ SOLN
0.0000 [IU] | SUBCUTANEOUS | Status: DC
  Administered 2018-07-28: 5 [IU] via SUBCUTANEOUS
  Administered 2018-07-28: 7 [IU] via SUBCUTANEOUS
  Administered 2018-07-29: 2 [IU] via SUBCUTANEOUS
  Administered 2018-07-29: 3 [IU] via SUBCUTANEOUS
  Administered 2018-07-29: 5 [IU] via SUBCUTANEOUS
  Administered 2018-07-29: 7 [IU] via SUBCUTANEOUS

## 2018-07-28 MED ORDER — LORAZEPAM 2 MG/ML IJ SOLN
1.0000 mg | Freq: Once | INTRAMUSCULAR | Status: DC
  Filled 2018-07-28: qty 1

## 2018-07-28 MED ORDER — VANCOMYCIN HCL IN DEXTROSE 1-5 GM/200ML-% IV SOLN
1000.0000 mg | Freq: Once | INTRAVENOUS | Status: AC
  Administered 2018-07-28: 1000 mg via INTRAVENOUS
  Filled 2018-07-28: qty 200

## 2018-07-28 MED ORDER — FLUTICASONE PROPIONATE 50 MCG/ACT NA SUSP: 1.0000 | Freq: Every day | NASAL | Status: DC | PRN

## 2018-07-28 MED ORDER — ONDANSETRON HCL 4 MG/2ML IJ SOLN
4.0000 mg | Freq: Once | INTRAMUSCULAR | Status: AC
  Administered 2018-07-28: 4 mg via INTRAVENOUS
  Filled 2018-07-28: qty 2

## 2018-07-28 MED ORDER — METOPROLOL SUCCINATE ER 25 MG PO TB24
25.0000 mg | ORAL_TABLET | Freq: Every day | ORAL | Status: DC
  Administered 2018-07-28 – 2018-08-01 (×5): 25 mg via ORAL
  Filled 2018-07-28 (×5): qty 1

## 2018-07-28 MED ORDER — DEXTROSE 10 % IV SOLN
INTRAVENOUS | Status: DC
  Administered 2018-07-28 (×2): via INTRAVENOUS
  Filled 2018-07-28 (×3): qty 1000

## 2018-07-28 MED ORDER — DEXTROSE 50 % IV SOLN
50.0000 mL | Freq: Once | INTRAVENOUS | Status: AC
  Administered 2018-07-28: 50 mL via INTRAVENOUS

## 2018-07-28 MED ORDER — NITROGLYCERIN 0.4 MG SL SUBL: 0.4000 mg | SUBLINGUAL_TABLET | SUBLINGUAL | Status: DC | PRN

## 2018-07-28 MED ORDER — POTASSIUM CHLORIDE 10 MEQ/100ML IV SOLN
10.0000 meq | Freq: Once | INTRAVENOUS | Status: AC
  Administered 2018-07-28: 10 meq via INTRAVENOUS
  Filled 2018-07-28: qty 100

## 2018-07-28 MED ORDER — ONDANSETRON HCL 4 MG PO TABS: 4.0000 mg | ORAL_TABLET | Freq: Four times a day (QID) | ORAL | Status: DC | PRN

## 2018-07-28 MED ORDER — FINASTERIDE 5 MG PO TABS
5.0000 mg | ORAL_TABLET | Freq: Every day | ORAL | Status: DC
  Administered 2018-07-28 – 2018-08-01 (×5): 5 mg via ORAL
  Filled 2018-07-28 (×5): qty 1

## 2018-07-28 NOTE — ED Notes (Signed)
Unable to obtain complete triage questions due to patients mental status. Patient appears disoriented x 4. Patient unable to answer any questions. Patient is non-verbal and not making any verbal communication.

## 2018-07-28 NOTE — ED Notes (Signed)
Patient transported to CT 

## 2018-07-28 NOTE — ED Provider Notes (Signed)
[facility] DEPT Provider Note   CSN: [id number] Arrival date & time: 07/28/18  0938     History   Chief Complaint Chief Complaint  Patient presents with  . Altered Mental Status  . Emesis    HPI Jerry [name] is a 77 y.o. male.  With past medical history of DM, HTN, HLD, CML, brought to the ED by EMS from home due to vomiting and altered mental status.  Patient is nonverbal and no family member are at the room.  Source of history is information provided by EMS to the nurse.  Patient has had vomiting and  found to be altered on EMS arrival.  Vital sign were stable and blood glucose was 145 on EMS arrival. Trying to reach the family for more information. No response yet.  Update: Son and daughter-in-law arrived.They were out of town and notified by EMS. He has activated his medical alert system and EMS has been notified. Family mention that they last talked to the patient yesterday and he was on his normal base without any complaint.  They mentions that he lives alone and is active, and oriented. How ever, he has had intermittent episodes of low and very high blood sugar.  HPI  Past Medical History:  Diagnosis Date  . Diabetes mellitus without complication ([place])   . Hyperlipidemia   . Hypertension   . Leukemia ([place])   . MRSA infection 09/19/2015   left arm  . Urinary retention 01/11/2014    Patient Active Problem List   Diagnosis Date Noted  . Sepsis secondary to UTI ([place]) 07/28/2018  . Type 2 diabetes mellitus with hypoglycemia without coma ([place]) 07/28/2018  . Acute metabolic encephalopathy 18/29/9371  . Abscess of forearm 09/25/2015  . Chest pain 09/25/2015  . Hyperlipidemia LDL goal <100 09/25/2015  . Infection with methicillin-resistant Staphylococcus aureus 09/19/2015  . Cellulitis of upper extremity 09/15/2015  . Fall in home 09/15/2015  . Muscle ache 09/15/2015  . Type 2 diabetes mellitus with hyperglycemia ([place]) 09/15/2015  .  Avitaminosis D 08/05/2015  . Chronic kidney disease (CKD), stage III (moderate) ([place]) 05/13/2015  . Long term current use of insulin ([place]) 05/13/2015  . Lumbar radiculopathy 04/15/2015  . LBP (low back pain) 11/05/2014  . Excessive falling 09/25/2014  . CAD in native artery 07/26/2014  . Congestive heart failure ([place]) 07/26/2014  . 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 Accumulation of fluid in tissues 11/05/2013  . Essential (primary) hypertension 11/05/2013  . Anemia in chronic illness 10/12/2013  . Dizziness 07/31/2013  . Allergic rhinitis 01/20/2013  . Anxiety state 01/20/2013  . Carpal tunnel syndrome 01/20/2013  . 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 Acid reflux 01/20/2013  . Diabetic polyneuropathy ([place]) 01/20/2013  . Restless leg 01/20/2013  . Detached retina 01/20/2013  . Absence of bladder continence 01/20/2013  . Bladder neck obstruction 11/29/2012    Past Surgical History:  Procedure Laterality Date  . APPENDECTOMY    . BACK SURGERY    . CHOLECYSTECTOMY    . EYE SURGERY    . INCISE AND DRAIN ABCESS Left 09/16/2015   forearm; debscess undere MAC; [facility]; Dr Curley Spice  . INCISION AND DRAINAGE ABSCESS Left 09/19/15   forearm wound washout with pulavac at [facility] by Dr Curley Spice        Home Medications    Prior to Admission medications   Medication Sig Start Date  End Date Taking? Authorizing Provider  acetaminophen (TYLENOL) 325 MG tablet Take 325 mg by mouth. 01/03/14   [provider]  aspirin EC 81 MG tablet Take 81 mg by mouth.    [provider]  atorvastatin (LIPITOR) 80 MG tablet Take 80 mg by mouth. 11/05/14 11/06/15  [provider]  busPIRone (BUSPAR) 5 MG tablet  03/20/15   [provider]  cetirizine (ZYRTEC) 10 MG tablet  09/25/14 09/26/15  [provider]  dasatinib (SPRYCEL) 50 MG tablet Take 50  mg by mouth. 05/16/15   [provider]  fenofibrate 160 MG tablet  11/05/14 11/05/15  [provider]  finasteride (PROSCAR) 5 MG tablet Take 5 mg by mouth. 08/02/14   [provider]  fluticasone Asencion Islam) 50 MCG/ACT nasal spray  02/28/15   [provider]  furosemide (LASIX) 20 MG tablet Take 20 mg by mouth. 02/28/15 02/29/16  [provider]  Insulin Detemir (LEVEMIR) 100 UNIT/ML Pen  09/23/15 09/22/16  [provider]  insulin lispro (HUMALOG) 100 UNIT/ML injection Inject into the skin.    [provider]  metoprolol succinate (TOPROL-XL) 25 MG 24 hr tablet  03/19/15   [provider]  montelukast (SINGULAIR) 10 MG tablet  02/28/15 02/28/16  [provider]  nitroGLYCERIN (NITROSTAT) 0.4 MG SL tablet Place 0.4 mg under the tongue. 07/06/14   [provider]  omeprazole (PRILOSEC) 40 MG capsule  02/28/15   [provider]  potassium chloride SA (KLOR-CON M20) 20 MEQ tablet Take by mouth. 07/01/15   [provider]  promethazine (PHENERGAN) 25 MG tablet Take 25 mg by mouth. 02/19/15   [provider]  Vitamin D, Ergocalciferol, (DRISDOL) 50000 units CAPS capsule Take by mouth. 08/13/15   [provider]    Family History Family History  Problem Relation Age of Onset  . Heart attack Other   . Diabetes Other   . Hypertension Other     Social History Social History   Tobacco Use  . Smoking status: Former Research scientist (life sciences)  . Smokeless tobacco: Never Used  Substance Use Topics  . Alcohol use: No    Alcohol/week: 0.0 standard drinks  . Drug use: No     Allergies   Influenza vaccines; Aspirin; Cyclobenzaprine; Dextromethorphan; Doxycycline; Flu virus vaccine; Gabapentin; Glipizide; Hydrocodone-acetaminophen; Iodinated diagnostic agents; Latex; Lisinopril; Penicillins; Pregabalin; Ropinirole; Salicylates; Sulfamethoxazole-trimethoprim; and Oxycodone-acetaminophen   Review of  Systems Review of Systems   Physical Exam Updated Vital Signs BP 121/71   Pulse (!) 101   Temp (!) 97.4 F (36.3 C) (Oral)   Resp 13   Ht _0  (1.854 m)   Wt 70 kg   SpO2 100%   BMI 20.36 kg/m   Physical Exam Vitals signs and nursing note reviewed.      Appearance: He is ill-appearing. Mildly agitated and Non verbal.     CV: Heart sounds: No murmur, No JVD.     Respiratory: No respiratory distress. Breath sounds: Rhonchi present.      Abdomen:There is no obvious abdominal tenderness. There is no guarding.      Neurological: Mental Status: Is drowsy. Open his eyes on and off and with      verbal stimuli. He is disoriented. Non verbal. Does not follow commands.       Extremities: Cold. Pulses are normal. No LEE.       Skin: Has some pallor. No icter.     ED Treatments / Results  Labs (all labs ordered  are listed, but only abnormal results are displayed) Labs Reviewed  CBC WITH DIFFERENTIAL/PLATELET - Abnormal; Notable for the following components:      Result Value   WBC 13.7 (*)    Hemoglobin 12.6 (*)    HCT 37.3 (*)    Neutro Abs 9.0 (*)    Monocytes Absolute 1.6 (*)    All other components within normal limits  COMPREHENSIVE METABOLIC PANEL - Abnormal; Notable for the following components:   Potassium 3.1 (*)    Chloride 97 (*)    Glucose, Bld 49 (*)    BUN 29 (*)    Creatinine, Ser 1.38 (*)    Total Protein 8.2 (*)    GFR calc non Af Amer 50 (*)    GFR calc Af Amer 58 (*)    All other components within normal limits  URINALYSIS, ROUTINE W REFLEX MICROSCOPIC - Abnormal; Notable for the following components:   APPearance CLOUDY (*)    Glucose, UA >=500 (*)    Hgb urine dipstick MODERATE (*)    Protein, ur 100 (*)    Nitrite POSITIVE (*)    Leukocytes, UA LARGE (*)    WBC, UA >50 (*)    Bacteria, UA MANY (*)    All other components within normal limits  LIPASE, BLOOD - Abnormal; Notable for the following components:   Lipase 54 (*)    All other  components within normal limits  TSH - Abnormal; Notable for the following components:   TSH 7.402 (*)    All other components within normal limits  GLUCOSE, CAPILLARY - Abnormal; Notable for the following components:   Glucose-Capillary 157 (*)    All other components within normal limits  CBC - Abnormal; Notable for the following components:   WBC 17.5 (*)    Hemoglobin 12.1 (*)    HCT 35.4 (*)    All other components within normal limits  I-STAT CG4 LACTIC ACID, ED - Abnormal; Notable for the following components:   Lactic Acid, Venous 4.28 (*)    All other components within normal limits  I-STAT CHEM 8, ED - Abnormal; Notable for the following components:   Potassium 3.1 (*)    BUN 31 (*)    Creatinine, Ser 1.40 (*)    Glucose, Bld 44 (*)    Hemoglobin 12.6 (*)    HCT 37.0 (*)    All other components within normal limits  CBG MONITORING, ED - Abnormal; Notable for the following components:   Glucose-Capillary 39 (*)    All other components within normal limits  CBG MONITORING, ED - Abnormal; Notable for the following components:   Glucose-Capillary 167 (*)    All other components within normal limits  URINE CULTURE  CULTURE, BLOOD (ROUTINE X 2)  CULTURE, BLOOD (ROUTINE X 2)  MRSA PCR SCREENING  MAGNESIUM  CREATININE, SERUM  TSH  T4, FREE  T3, FREE  HEMOGLOBIN A1C  I-STAT TROPONIN, ED  CBG MONITORING, ED  I-STAT CG4 LACTIC ACID, ED    EKG EKG Interpretation  Date/Time:  Friday July 28 2018 09:05:43 EST Ventricular Rate:  105 PR Interval:    QRS Duration: 104 QT Interval:  355 QTC Calculation: 470 R Axis:   4 Text Interpretation:  Sinus tachycardia Ventricular trigeminy Repol abnrm suggests ischemia, anterolateral Baseline wander in lead(s) V3 No previous ECGs available Confirmed by Wandra Arthurs [phone]) on 07/28/2018 9:09:17 AM   Radiology Dg Chest 2 View  Result Date: 07/28/2018 CLINICAL DATA:  Altered mental  status with nausea and vomiting.  Hypertension. EXAM: CHEST - 2 VIEW COMPARISON:  June 20, 2018 FINDINGS: No edema or consolidation. Heart is upper normal in size with pulmonary vascularity normal. No adenopathy. No bone lesions. IMPRESSION: No edema or consolidation.  Heart upper normal in size. Electronically Signed   By: Lowella Grip III M.D.   On: 07/28/2018 09:43   Ct Head Wo Contrast  Result Date: 07/28/2018 CLINICAL DATA:  Altered mental status with nausea and vomiting. EXAM: CT HEAD WITHOUT CONTRAST TECHNIQUE: Contiguous axial images were obtained from the base of the skull through the vertex without intravenous contrast. COMPARISON:  September 26, 2016 FINDINGS: Brain: There is age related volume loss, stable. There is no intracranial mass, hemorrhage, extra-axial fluid collection, or midline shift. There is slight small vessel disease in the centra semiovale bilaterally. No acute appearing infarct is demonstrable on this study. Vascular: There is no appreciable hyperdense vessel. There is calcification in each distal vertebral artery as well as in each carotid siphon region. Skull: The bony calvarium appears intact. Sinuses/Orbits: There is mucosal thickening in several ethmoid air cells bilaterally. Other visualized paranasal sinuses are clear. There is evidence of prior scleral banding on the right. Orbits appear symmetric elsewhere bilaterally. Other: Mastoid air cells are clear. IMPRESSION: Age related volume loss with mild periventricular small vessel disease. No acute infarct evident. No mass or hemorrhage. Foci of arterial vascular calcification noted. Mucosal thickening noted in several ethmoid air cells. Scleral banding right globe. Electronically Signed   By: Lowella Grip III M.D.   On: 07/28/2018 09:36   Ct Renal Stone Study  Result Date: 07/28/2018 CLINICAL DATA:  Renal failure.  Vomiting.  Lactic acidosis. EXAM: CT ABDOMEN AND PELVIS WITHOUT CONTRAST TECHNIQUE: Multidetector CT imaging of the abdomen and  pelvis was performed following the standard protocol without IV contrast. COMPARISON:  02/11/2018 FINDINGS: Lower chest: Moderate to diffuse coronary artery calcifications. Heart is normal size. Hepatobiliary: No focal hepatic abnormality.  Prior cholecystectomy Pancreas: No focal abnormality or ductal dilatation. Spleen: No focal abnormality.  Normal size. Adrenals/Urinary Tract: No adrenal abnormality. No focal renal abnormality. No stones or hydronephrosis. Bladder wall diverticula noted both right lateral and left lateral. The largest is on the right measuring up to 5.6 cm. Stomach/Bowel: Stomach, large and small bowel grossly unremarkable. Vascular/Lymphatic: Aortic atherosclerosis. No enlarged abdominal or pelvic lymph nodes. Reproductive: No visible focal abnormality. Other: No free fluid or free air. Musculoskeletal: No acute bony abnormality. IMPRESSION: No renal or ureteral stones. No hydronephrosis. Two bladder wall diverticula, the largest on the right measuring up to 5.6 cm. Prior cholecystectomy. Aortic atherosclerosis. No acute findings in the abdomen or pelvis. Electronically Signed   By: Rolm Baptise M.D.   On: 07/28/2018 11:50    Procedures Procedures (including critical care time)  Medications Ordered in ED Medications  dextrose 10 % infusion ( Intravenous New Bag/Given 07/28/18 1242)  dextrose 50 % solution (  Canceled Entry 07/28/18 1301)  insulin aspart (novoLOG) injection 0-9 Units (has no administration in time range)  enoxaparin (LOVENOX) injection 40 mg (40 mg Subcutaneous Given 07/28/18 1518)  acetaminophen (TYLENOL) tablet 650 mg (650 mg Oral Given 07/28/18 1522)    Or  acetaminophen (TYLENOL) suppository 650 mg ( Rectal See Alternative 07/28/18 1522)  ondansetron (ZOFRAN) tablet 4 mg (has no administration in time range)    Or  ondansetron (ZOFRAN) injection 4 mg (has no administration in time range)  cefTRIAXone (ROCEPHIN) 1 g in sodium chloride 0.9 %  100 mL IVPB (has  no administration in time range)  metoprolol succinate (TOPROL-XL) 24 hr tablet 25 mg (has no administration in time range)  nitroGLYCERIN (NITROSTAT) SL tablet 0.4 mg (has no administration in time range)  pantoprazole (PROTONIX) EC tablet 40 mg (has no administration in time range)  finasteride (PROSCAR) tablet 5 mg (has no administration in time range)  potassium chloride SA (K-DUR,KLOR-CON) CR tablet 40 mEq (has no administration in time range)  fluticasone (FLONASE) 50 MCG/ACT nasal spray 1 spray (has no administration in time range)  LORazepam (ATIVAN) injection 1 mg (1 mg Intravenous Given 07/28/18 1004)  sodium chloride 0.9 % bolus 1,000 mL (1,000 mLs Intravenous New Bag/Given 07/28/18 0955)  ondansetron (ZOFRAN) injection 4 mg (4 mg Intravenous Given 07/28/18 1004)  dextrose 50 % solution 50 mL (50 mLs Intravenous Given 07/28/18 0920)  vancomycin (VANCOCIN) IVPB 1000 mg/200 mL premix (0 mg Intravenous Stopped 07/28/18 1301)  ceFEPIme (MAXIPIME) 1 g in sodium chloride 0.9 % 100 mL IVPB (0 g Intravenous Stopped 07/28/18 1301)  sodium chloride 0.9 % bolus 1,000 mL (1,000 mLs Intravenous New Bag/Given 07/28/18 1022)  potassium chloride 10 mEq in 100 mL IVPB (10 mEq Intravenous New Bag/Given 07/28/18 1259)  dextrose 50 % solution 50 mL (50 mLs Intravenous Given 07/28/18 1300)     Initial Impression / Assessment and Plan / ED Course  I have reviewed the triage vital signs and the nursing notes.  Pertinent labs & imaging results that were available during my care of the patient were reviewed by me and considered in my medical decision making (see chart for details).     Patient with vomiting and altered mental status. HD stable on arrival. No fever. Looks ill and is non verbal with no major gross weakness.  Will do work up for infection, CNS events, metabolic abnormality DKA, hypoglycemia, electrolyte abnormality also blast  -Istat Chem 8---> Blood glucose 44.  -Will check CBG and  giving D50. -CBC with diff -Head Ct scan -CXR -Lactic acid -Lipase -U/A and U/C -IV fluid  Update: Lactic acid at 4.3. starting antibiotic. May narrow after having rest of result.  CT head with no acute infarct evident. No mass or hemorrhage. CXR no consolidation or edema reported. (Mild haziness at left side is seen, not clear as it is overlapped with shoulder blade) CBC with leukocytosis at 13000.  CBG at 77 despite D50. K:3.1  -Dextrose 10% 125 ml/h infusion -Ct with Vanc and Cefepim -CT abdomen -Kcl 40 meq IV   Update: Talked to the son and daughter-in-law.  They were says that they last talked to the patient yesterday he was on his normal base without any complaint.  Discussed the results so far and probable admission. They agree with the plan.  Update: Patient is more alert but still confused and can only answer some the questions. Confirms that he had vomiting and some abdominal pain today and there was no one to help him. No more clear information provided by him.  U/A is positive for UTI. CT abdomen did not show acute abnormality. BG dropped to 39. Got another D50 and is getting D10%. Plan will be admitting him.   Final Clinical Impressions(s) / ED Diagnoses   Final diagnoses:  Acute cystitis without hematuria  Confusion    ED Discharge Orders    None       Dewayne Hatch, MD 07/28/18 1545    Drenda Freeze, MD 07/29/18 [phone]

## 2018-07-28 NOTE — ED Notes (Signed)
Bed: WA22 Expected date:  Expected time:  Means of arrival:  Comments: EMS-weakness 

## 2018-07-28 NOTE — ED Triage Notes (Signed)
Patient arrived by EMS from home. Pt c/o nausea and vomiting, weakness. EMS reports that patient appears to be mentally altered.   CBG 145, HR 90, BP 115/52, RR 24, SpO2 98% on RA.   Hx of DM, Cancer.   4mg  Zofran given by EMS

## 2018-07-28 NOTE — ED Notes (Signed)
ED TO INPATIENT HANDOFF REPORT  Name/Age/Gender Jerry [name] 77 y.o. male  Code Status   Home/SNF/Other Home  Chief Complaint Altered Mental Status  Level of Care/Admitting Diagnosis ED Disposition    ED Disposition Condition [facility] Area: [facility] [100102]  Level of Care: Telemetry [5]  Admit to tele based on following criteria: Monitor QTC interval  Admit to tele based on following criteria: Monitor for Ischemic changes  Diagnosis: Sepsis secondary to UTI [facility]) [102585]  Admitting Physician: Louellen Molder [id number]  Attending Physician: Louellen Molder [phone]  Estimated length of stay: past midnight tomorrow  Certification:: I certify this patient will need inpatient services for at least 2 midnights  PT Class (Do Not Modify): Inpatient [101]  PT Acc Code (Do Not Modify): Private [1]       Medical History Past Medical History:  Diagnosis Date  . Diabetes mellitus without complication ([place])   . Hyperlipidemia   . Hypertension   . Leukemia ([place])   . MRSA infection 09/19/2015   left arm  . Urinary retention 01/11/2014    Allergies Allergies  Allergen Reactions  . Influenza Vaccines Hives  . Aspirin     Other reaction(s): DIZZINESS Other reaction(s): UNCONSCIOUSNESS 09/16/15 pt takes 81 mg aspirin at home.[name], RPh  . Cyclobenzaprine     Other reaction(s): HIVES  . Dextromethorphan   . Doxycycline     Other reaction(s): NAUSEA  . Flu Virus Vaccine   . Gabapentin     Other reaction(s): GI UPSET,DROWSINESS  . Glipizide     Other reaction(s): DIZZINESS  . Hydrocodone-Acetaminophen   . Iodinated Diagnostic Agents     Other reaction(s): NO ALLERGY  . Latex     Other reaction(s): NO ALLERGY  . Lisinopril     Other reaction(s): COUGH  . Penicillins     Other reaction(s): PRURITUS, RASH Other reaction(s): HIVES  . Pregabalin     Other reaction(s): GI UPSET,DROWSINESS  . Ropinirole     Other reaction(s):  NAUSEA  . Salicylates   . Sulfamethoxazole-Trimethoprim     Other reaction(s): NAUSEA  . Oxycodone-Acetaminophen Nausea Only    Other reaction(s): HIVES    IV Location/Drains/Wounds Patient Lines/Drains/Airways Status   Active Line/Drains/Airways    Name:   Placement date:   Placement time:   Site:   Days:   Peripheral IV 07/28/18 Left Hand   07/28/18    0846    Hand   less than 1   Peripheral IV 07/28/18 Left Antecubital   07/28/18    1249    Antecubital   less than 1          Labs/Imaging Results for orders placed or performed during the hospital encounter of 07/28/18 (from the past 48 hour(s))  CBC with Differential     Status: Abnormal   Collection Time: 07/28/18  9:07 AM  Result Value Ref Range   WBC 13.7 (H) 4.0 - 10.5 K/uL   RBC 4.46 4.22 - 5.81 MIL/uL   Hemoglobin 12.6 (L) 13.0 - 17.0 g/dL   HCT 37.3 (L) 39.0 - 52.0 %   MCV 83.6 80.0 - 100.0 fL   MCH 28.3 26.0 - 34.0 pg   MCHC 33.8 30.0 - 36.0 g/dL   RDW 12.9 11.5 - 15.5 %   Platelets 275 150 - 400 K/uL   nRBC 0.0 0.0 - 0.2 %   Neutrophils Relative % 65 %   Neutro Abs 9.0 (H) 1.7 - 7.7 K/uL  Lymphocytes Relative 19 %   Lymphs Abs 2.6 0.7 - 4.0 K/uL   Monocytes Relative 12 %   Monocytes Absolute 1.6 (H) 0.1 - 1.0 K/uL   Eosinophils Relative 2 %   Eosinophils Absolute 0.2 0.0 - 0.5 K/uL   Basophils Relative 1 %   Basophils Absolute 0.1 0.0 - 0.1 K/uL   Immature Granulocytes 1 %   Abs Immature Granulocytes 0.07 0.00 - 0.07 K/uL    Comment: Performed at [facility], [place] [address], [place], [place] [address]  Comprehensive metabolic panel     Status: Abnormal   Collection Time: 07/28/18  9:07 AM  Result Value Ref Range   Sodium 137 135 - 145 mmol/L   Potassium 3.1 (L) 3.5 - 5.1 mmol/L   Chloride 97 (L) 98 - 111 mmol/L   CO2 26 22 - 32 mmol/L   Glucose, Bld 49 (L) 70 - 99 mg/dL   BUN 29 (H) 8 - 23 mg/dL   Creatinine, Ser 1.38 (H) 0.61 - 1.24 mg/dL   Calcium 9.4 8.9 - 10.3 mg/dL   Total  Protein 8.2 (H) 6.5 - 8.1 g/dL   Albumin 4.4 3.5 - 5.0 g/dL   AST 32 15 - 41 U/L   ALT 22 0 - 44 U/L   Alkaline Phosphatase 101 38 - 126 U/L   Total Bilirubin 0.9 0.3 - 1.2 mg/dL   GFR calc non Af Amer 50 (L) >60 mL/min   GFR calc Af Amer 58 (L) >60 mL/min   Anion gap 14 5 - 15    Comment: Performed at [facility], [place] [address] [place], [place] [address]  Lipase, blood     Status: Abnormal   Collection Time: 07/28/18  9:07 AM  Result Value Ref Range   Lipase 54 (H) 11 - 51 U/L    Comment: Performed at [facility], [place] [address], [place], [place] [address]  TSH     Status: Abnormal   Collection Time: 07/28/18  9:07 AM  Result Value Ref Range   TSH 7.402 (H) 0.350 - 4.500 uIU/mL    Comment: Performed by a 3rd Generation assay with a functional sensitivity of <=0.01 uIU/mL. Performed at [facility], [place] [address] [place], [place] [address]   I-Stat Troponin, ED (not at [facility])     Status: None   Collection Time: 07/28/18  9:09 AM  Result Value Ref Range   Troponin i, poc 0.00 0.00 - 0.08 ng/mL   Comment 3            Comment: Due to the release kinetics of cTnI, a negative result within the first hours of the onset of symptoms does not rule out myocardial infarction with certainty. If myocardial infarction is still suspected, repeat the test at appropriate intervals.   I-Stat CG4 Lactic Acid, ED     Status: Abnormal   Collection Time: 07/28/18  9:11 AM  Result Value Ref Range   Lactic Acid, Venous 4.28 (HH) 0.5 - 1.9 mmol/L   Comment NOTIFIED PHYSICIAN   I-Stat Chem 8, ED     Status: Abnormal   Collection Time: 07/28/18  9:11 AM  Result Value Ref Range   Sodium 138 135 - 145 mmol/L   Potassium 3.1 (L) 3.5 - 5.1 mmol/L   Chloride 99 98 - 111 mmol/L   BUN 31 (H) 8 - 23 mg/dL   Creatinine, Ser 1.40 (H) 0.61 - 1.24 mg/dL   Glucose, Bld  44 (LL) 70 - 99 mg/dL   Calcium, Ion 1.22 1.15 - 1.40 mmol/L   TCO2 32 22 - 32  mmol/L   Hemoglobin 12.6 (L) 13.0 - 17.0 g/dL   HCT 37.0 (L) 39.0 - 52.0 %   Comment NOTIFIED PHYSICIAN   CBG monitoring, ED     Status: None   Collection Time: 07/28/18 10:15 AM  Result Value Ref Range   Glucose-Capillary 75 70 - 99 mg/dL  Urinalysis, Routine w reflex microscopic     Status: Abnormal   Collection Time: 07/28/18 12:28 PM  Result Value Ref Range   Color, Urine YELLOW YELLOW   APPearance CLOUDY (A) CLEAR   Specific Gravity, Urine 1.020 1.005 - 1.030   pH 5.0 5.0 - 8.0   Glucose, UA >=500 (A) NEGATIVE mg/dL   Hgb urine dipstick MODERATE (A) NEGATIVE   Bilirubin Urine NEGATIVE NEGATIVE   Ketones, ur NEGATIVE NEGATIVE mg/dL   Protein, ur 100 (A) NEGATIVE mg/dL   Nitrite POSITIVE (A) NEGATIVE   Leukocytes, UA LARGE (A) NEGATIVE   RBC / HPF 6-10 0 - 5 RBC/hpf   WBC, UA >50 (H) 0 - 5 WBC/hpf   Bacteria, UA MANY (A) NONE SEEN   Squamous Epithelial / LPF 0-5 0 - 5   WBC Clumps PRESENT    Mucus PRESENT    Hyaline Casts, UA PRESENT     Comment: Performed at [facility], [place] [address] [place], [place] [address]  I-Stat CG4 Lactic Acid, ED     Status: None   Collection Time: 07/28/18 12:31 PM  Result Value Ref Range   Lactic Acid, Venous 1.79 0.5 - 1.9 mmol/L  CBG monitoring, ED     Status: Abnormal   Collection Time: 07/28/18 12:31 PM  Result Value Ref Range   Glucose-Capillary 39 (LL) 70 - 99 mg/dL   Comment 1 Notify RN    Dg Chest 2 View  Result Date: 07/28/2018 CLINICAL DATA:  Altered mental status with nausea and vomiting. Hypertension. EXAM: CHEST - 2 VIEW COMPARISON:  June 20, 2018 FINDINGS: No edema or consolidation. Heart is upper normal in size with pulmonary vascularity normal. No adenopathy. No bone lesions. IMPRESSION: No edema or consolidation.  Heart upper normal in size. Electronically Signed   By: Lowella Grip III M.D.   On: 07/28/2018 09:43   Ct Head Wo Contrast  Result Date: 07/28/2018 CLINICAL DATA:  Altered mental  status with nausea and vomiting. EXAM: CT HEAD WITHOUT CONTRAST TECHNIQUE: Contiguous axial images were obtained from the base of the skull through the vertex without intravenous contrast. COMPARISON:  September 26, 2016 FINDINGS: Brain: There is age related volume loss, stable. There is no intracranial mass, hemorrhage, extra-axial fluid collection, or midline shift. There is slight small vessel disease in the centra semiovale bilaterally. No acute appearing infarct is demonstrable on this study. Vascular: There is no appreciable hyperdense vessel. There is calcification in each distal vertebral artery as well as in each carotid siphon region. Skull: The bony calvarium appears intact. Sinuses/Orbits: There is mucosal thickening in several ethmoid air cells bilaterally. Other visualized paranasal sinuses are clear. There is evidence of prior scleral banding on the right. Orbits appear symmetric elsewhere bilaterally. Other: Mastoid air cells are clear. IMPRESSION: Age related volume loss with mild periventricular small vessel disease. No acute infarct evident. No mass or hemorrhage. Foci of arterial vascular calcification noted. Mucosal thickening noted in several ethmoid air cells. Scleral banding right globe. Electronically Signed  By: Lowella Grip III M.D.   On: 07/28/2018 09:36   Ct Renal Stone Study  Result Date: 07/28/2018 CLINICAL DATA:  Renal failure.  Vomiting.  Lactic acidosis. EXAM: CT ABDOMEN AND PELVIS WITHOUT CONTRAST TECHNIQUE: Multidetector CT imaging of the abdomen and pelvis was performed following the standard protocol without IV contrast. COMPARISON:  02/11/2018 FINDINGS: Lower chest: Moderate to diffuse coronary artery calcifications. Heart is normal size. Hepatobiliary: No focal hepatic abnormality.  Prior cholecystectomy Pancreas: No focal abnormality or ductal dilatation. Spleen: No focal abnormality.  Normal size. Adrenals/Urinary Tract: No adrenal abnormality. No focal renal  abnormality. No stones or hydronephrosis. Bladder wall diverticula noted both right lateral and left lateral. The largest is on the right measuring up to 5.6 cm. Stomach/Bowel: Stomach, large and small bowel grossly unremarkable. Vascular/Lymphatic: Aortic atherosclerosis. No enlarged abdominal or pelvic lymph nodes. Reproductive: No visible focal abnormality. Other: No free fluid or free air. Musculoskeletal: No acute bony abnormality. IMPRESSION: No renal or ureteral stones. No hydronephrosis. Two bladder wall diverticula, the largest on the right measuring up to 5.6 cm. Prior cholecystectomy. Aortic atherosclerosis. No acute findings in the abdomen or pelvis. Electronically Signed   By: Rolm Baptise M.D.   On: 07/28/2018 11:50    Pending Labs Unresulted Labs (From admission, onward)    Start     Ordered   07/28/18 0845  Blood culture (routine x 2)  BLOOD CULTURE X 2,   STAT     07/28/18 0846   07/28/18 0844  Urine culture  ONCE - STAT,   STAT     07/28/18 0846   Signed and Held  CBC  (enoxaparin (LOVENOX)    CrCl >/= 30 ml/min)  Once,   R    Comments:  Baseline for enoxaparin therapy IF NOT ALREADY DRAWN.  Notify MD if PLT < 100 K.    Signed and Held   Signed and Held  Creatinine, serum  (enoxaparin (LOVENOX)    CrCl >/= 30 ml/min)  Once,   R    Comments:  Baseline for enoxaparin therapy IF NOT ALREADY DRAWN.    Signed and Held   Signed and Held  Creatinine, serum  (enoxaparin (LOVENOX)    CrCl >/= 30 ml/min)  Weekly,   R    Comments:  while on enoxaparin therapy    Signed and Held   Signed and Held  Hemoglobin A1c  Add-on,   R     Signed and Held   Signed and Held  Basic metabolic panel  Tomorrow morning,   R     Signed and Held   Signed and Held  CBC  Tomorrow morning,   R     Signed and Held   Signed and Held  TSH  Add-on,   R     Signed and Held          Vitals/Pain Today's Vitals   07/28/18 0828 07/28/18 0845 07/28/18 1230 07/28/18 1232  BP: 140/76  (!) 116/53 (!) 116/53   Pulse: 75   (!) 102  Resp: 16  14 13   Temp:  97.6 F (36.4 C)    TempSrc:  Oral    SpO2: 100%   100%    Isolation Precautions No active isolations  Medications Medications  dextrose 10 % infusion ( Intravenous New Bag/Given 07/28/18 1242)  potassium chloride 10 mEq in 100 mL IVPB (10 mEq Intravenous New Bag/Given 07/28/18 1259)  dextrose 50 % solution (  Canceled Entry 07/28/18 1301)  LORazepam (ATIVAN) injection 1 mg (1 mg Intravenous Given 07/28/18 1004)  sodium chloride 0.9 % bolus 1,000 mL (1,000 mLs Intravenous New Bag/Given 07/28/18 0955)  ondansetron (ZOFRAN) injection 4 mg (4 mg Intravenous Given 07/28/18 1004)  dextrose 50 % solution 50 mL (50 mLs Intravenous Given 07/28/18 0920)  vancomycin (VANCOCIN) IVPB 1000 mg/200 mL premix (0 mg Intravenous Stopped 07/28/18 1301)  ceFEPIme (MAXIPIME) 1 g in sodium chloride 0.9 % 100 mL IVPB (0 g Intravenous Stopped 07/28/18 1301)  sodium chloride 0.9 % bolus 1,000 mL (1,000 mLs Intravenous New Bag/Given 07/28/18 1022)  dextrose 50 % solution 50 mL (50 mLs Intravenous Given 07/28/18 1300)    Mobility walks with person assist

## 2018-07-28 NOTE — ED Notes (Addendum)
Patient transported to CT 

## 2018-07-28 NOTE — ED Notes (Signed)
[phone], [name].. Call [name] if you find anything or have any major update [name] would like to be made aware of any changes Requesting this information is the Son he takes care of him.

## 2018-07-28 NOTE — ED Notes (Signed)
Drs. Myrtie Hawk and Darl Householder notified of patients Lactic Acid result of 4.28. Also notified of Glucose result of 44.

## 2018-07-28 NOTE — H&P (Signed)
TRH H&P   Patient Demographics:    Jerry [name], is a 77 y.o. male  MRN: [id number]   DOB - September 20, 1940  Admit Date - 07/28/2018  Outpatient Primary MD for the patient is Patient, No Pcp Per  Referring MD: Dr. Darl Householder  Outpatient Specialists: Oncologist at [facility]  Patient coming from: Home  Chief Complaint  Patient presents with  . Altered Mental Status  . Emesis      HPI:   History obtained from EDP, PCP note from 1 month back and family as patient is confused and unable to provide clear history.   Jerry [name]  is a 77 y.o. male, with history of poorly controlled type 1 diabetes mellitus on insulin, hypertension, CML (noncompliant with follow-up and treatment), diastolic CHF, chronic kidney disease stage III (baseline creatinine not known), hyperlipidemia who was hospitalized at [facility] [address] with nausea vomiting, diarrhea and abdominal pain secondary to UTI and hyperglycemia without ketosis.  Urine culture grew Citrobacter and was treated with IV Rocephin and discharged home on 11/7.  He was then seen by his PCP last month.  At baseline patient lives alone and ambulates with a Rollator.  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 History is limited due to patient's confusion. Patient was having vomiting since this morning (patient did report to me that he was having vomiting today and had chills with poor p.o. intake yesterday).  When EMS arrived he was found to be altered, CBG was 145 per EMS.  Son and daughter in law were at bedside per ED physician who mentioned that they last spoke with the patient yesterday and he was normal (at baseline patient is coherent, independent with his ADLs and lives alone). Patient denies any recent illness, change in his medications, headache, dizziness, chest pain, shortness of breath, abdominal pain, diarrhea, tingling or numbness of the extremities,  syncope, fall or head injury.  He reports that he has off-and-on dysuria but denies hematuria.  He reports taking his insulin last night as prescribed. Per his medication list patient on Toujeo 30 units at bedtime and U-100 , 8 units 3 times a day with meals.  Course in the ED Patient was septic with tachycardia, WBC of 13. coming 7K, lactic acid of 4.4.  Blood work showed glucose of 44.  Patient given 2 rounds of D50 despite which CBG was in the 30s. .  Subsequent lactic normalized. UA was cloudy with large amount of glucose, moderate hemoglobin, ketones, positive nitrite and leukocyte, >50 WBC and many bacteria.  Sepsis pathway initiated in the ED and patient received 2 L normal saline bolus followed by D10 drip and given empiric vancomycin and cefepime.  Blood culture and urine culture were sent from the ED.  EKG showed sinus tachycardia in low 100s with ventricular trigeminy (no old EKG to compare).  TSH was elevated to 7.  CT head  showed no acute findings except for age-related volume loss and small vessel disease.  Chest x-ray negative for infiltrate.  CT renal study negative for ureteral stone or hydronephrosis and no acute findings. Hospitalist consulted for admission to telemetry for sepsis secondary to UTI and hypoglycemia.    Review of systems:    In addition to the HPI above, limited due to patient's encephalopathy. No Fever-chills ++ No Headache, No changes with Vision or hearing, No problems swallowing food or Liquids, No Chest pain, Cough or Shortness of Breath, No Abdominal pain, nausea and vomiting, bowel movements are regular, No Blood in stool or Urine, No dysuria, polyuria, No new skin rashes or bruises, No new joints pains-aches,  Weakness,, tingling, numbness in any extremity, No recent weight gain or loss, No  polydypsia or polyphagia, No significant Mental Stressors.    With Past History of the following :    Past Medical History:  Diagnosis Date  . Diabetes  mellitus without complication ([place])   . Hyperlipidemia   . Hypertension   . Leukemia ([place])   . MRSA infection 09/19/2015   left arm  . Urinary retention 01/11/2014      Past Surgical History:  Procedure Laterality Date  . APPENDECTOMY    . BACK SURGERY    . CHOLECYSTECTOMY    . EYE SURGERY    . INCISE AND DRAIN ABCESS Left 09/16/2015   forearm; debscess undere MAC; [facility]; Dr Curley Spice  . INCISION AND DRAINAGE ABSCESS Left 09/19/15   forearm wound washout with pulavac at [facility] by Dr Curley Spice      Social History:     Social History   Tobacco Use  . Smoking status: Former Smoker  Substance Use Topics  . Alcohol use: No    Alcohol/week: 0.0 standard drinks     Lives -home alone  Mobility -ambulates with a Rollator     Family History :     Family History  Problem Relation Age of Onset  . Heart attack Other   . Diabetes Other   . Hypertension Other       Home Medications:   Prior to Admission medications   Medication Sig Start Date End Date Taking? Authorizing Provider  acetaminophen (TYLENOL) 325 MG tablet Take 325 mg by mouth. 01/03/14   [provider]  aspirin EC 81 MG tablet Take 81 mg by mouth.    [provider]  atorvastatin (LIPITOR) 80 MG tablet Take 80 mg by mouth. 11/05/14 11/06/15  [provider]  busPIRone (BUSPAR) 5 MG tablet  03/20/15   [provider]  cetirizine (ZYRTEC) 10 MG tablet  09/25/14 09/26/15  [provider]  dasatinib (SPRYCEL) 50 MG tablet Take 50 mg by mouth. 05/16/15   [provider]  fenofibrate 160 MG tablet  11/05/14 11/05/15  [provider]  finasteride (PROSCAR) 5 MG tablet Take 5 mg by mouth. 08/02/14   [provider]  fluticasone Asencion Islam) 50 MCG/ACT nasal spray  02/28/15   [provider]  furosemide (LASIX) 20 MG tablet Take 20 mg by mouth. 02/28/15 02/29/16  [provider]  Insulin Detemir (LEVEMIR) 100 UNIT/ML Pen  09/23/15  09/22/16  [provider]  insulin lispro (HUMALOG) 100 UNIT/ML injection Inject into the skin.    [provider]  metoprolol succinate (TOPROL-XL) 25 MG 24 hr tablet  03/19/15   [provider]  montelukast (SINGULAIR) 10 MG tablet  02/28/15 02/28/16  [provider]  nitroGLYCERIN (NITROSTAT) 0.4  MG SL tablet Place 0.4 mg under the tongue. 07/06/14   [provider]  omeprazole (PRILOSEC) 40 MG capsule  02/28/15   [provider]  potassium chloride SA (KLOR-CON M20) 20 MEQ tablet Take by mouth. 07/01/15   [provider]  promethazine (PHENERGAN) 25 MG tablet Take 25 mg by mouth. 02/19/15   [provider]  Vitamin D, Ergocalciferol, (DRISDOL) 50000 units CAPS capsule Take by mouth. 08/13/15   [provider]     Allergies:     Allergies  Allergen Reactions  . Influenza Vaccines Hives  . Aspirin     Other reaction(s): DIZZINESS Other reaction(s): UNCONSCIOUSNESS 09/16/15 pt takes 81 mg aspirin at home.[name], RPh  . Cyclobenzaprine     Other reaction(s): HIVES  . Dextromethorphan   . Doxycycline     Other reaction(s): NAUSEA  . Flu Virus Vaccine   . Gabapentin     Other reaction(s): GI UPSET,DROWSINESS  . Glipizide     Other reaction(s): DIZZINESS  . Hydrocodone-Acetaminophen   . Iodinated Diagnostic Agents     Other reaction(s): NO ALLERGY  . Latex     Other reaction(s): NO ALLERGY  . Lisinopril     Other reaction(s): COUGH  . Penicillins     Other reaction(s): PRURITUS, RASH Other reaction(s): HIVES  . Pregabalin     Other reaction(s): GI UPSET,DROWSINESS  . Ropinirole     Other reaction(s): NAUSEA  . Salicylates   . Sulfamethoxazole-Trimethoprim     Other reaction(s): NAUSEA  . Oxycodone-Acetaminophen Nausea Only    Other reaction(s): HIVES     Physical Exam:   Vitals  Blood pressure (!) 116/53, pulse (!) 102, temperature 97.6 F (36.4 C), temperature source Oral, resp. rate  13, SpO2 100 %.   General: Elderly male lying in bed, appears fatigued, HEENT: Pupils reactive bilaterally, EOMI, no pallor, no icterus, dry oral mucosa, supple neck, no oral thrush, no cervical lymphadenopathy Chest: Clear to auscultation bilaterally, no added sound CVs: Normal S1-S2, no murmurs rub or gallop GI: Soft, nondistended, nontender, bowel sounds present Musculoskeletal: Warm, no edema CNS: Alert and oriented x1-2 (oriented to place ), no focal deficit   Data Review:    CBC Recent Labs  Lab 07/28/18 0907 07/28/18 0911  WBC 13.7*  --   HGB 12.6* 12.6*  HCT 37.3* 37.0*  PLT 275  --   MCV 83.6  --   MCH 28.3  --   MCHC 33.8  --   RDW 12.9  --   LYMPHSABS 2.6  --   MONOABS 1.6*  --   EOSABS 0.2  --   BASOSABS 0.1  --    ------------------------------------------------------------------------------------------------------------------  Chemistries  Recent Labs  Lab 07/28/18 0907 07/28/18 0911  NA 137 138  K 3.1* 3.1*  CL 97* 99  CO2 26  --   GLUCOSE 49* 44*  BUN 29* 31*  CREATININE 1.38* 1.40*  CALCIUM 9.4  --   AST 32  --   ALT 22  --   ALKPHOS 101  --   BILITOT 0.9  --    ------------------------------------------------------------------------------------------------------------------ CrCl cannot be calculated (Unknown ideal weight.). ------------------------------------------------------------------------------------------------------------------ Recent Labs    07/28/18 0907  TSH 7.402*    Coagulation profile No results for input(s): INR, PROTIME in the last 168 hours. ------------------------------------------------------------------------------------------------------------------- No results for input(s): DDIMER in the last 72 hours. -------------------------------------------------------------------------------------------------------------------  Cardiac Enzymes No results for input(s): CKMB, TROPONINI, MYOGLOBIN in the last 168  hours.  Invalid input(s): CK ------------------------------------------------------------------------------------------------------------------  No results found for: BNP   ---------------------------------------------------------------------------------------------------------------  Urinalysis    Component Value Date/Time   COLORURINE YELLOW 07/28/2018 1228   APPEARANCEUR CLOUDY (A) 07/28/2018 1228   LABSPEC 1.020 07/28/2018 1228   PHURINE 5.0 07/28/2018 1228   GLUCOSEU >=500 (A) 07/28/2018 1228   HGBUR MODERATE (A) 07/28/2018 1228   BILIRUBINUR NEGATIVE 07/28/2018 1228   KETONESUR NEGATIVE 07/28/2018 1228   PROTEINUR 100 (A) 07/28/2018 1228   NITRITE POSITIVE (A) 07/28/2018 1228   LEUKOCYTESUR LARGE (A) 07/28/2018 1228    ----------------------------------------------------------------------------------------------------------------   Imaging Results:    Dg Chest 2 View  Result Date: 07/28/2018 CLINICAL DATA:  Altered mental status with nausea and vomiting. Hypertension. EXAM: CHEST - 2 VIEW COMPARISON:  June 20, 2018 FINDINGS: No edema or consolidation. Heart is upper normal in size with pulmonary vascularity normal. No adenopathy. No bone lesions. IMPRESSION: No edema or consolidation.  Heart upper normal in size. Electronically Signed   By: Lowella Grip III M.D.   On: 07/28/2018 09:43   Ct Head Wo Contrast  Result Date: 07/28/2018 CLINICAL DATA:  Altered mental status with nausea and vomiting. EXAM: CT HEAD WITHOUT CONTRAST TECHNIQUE: Contiguous axial images were obtained from the base of the skull through the vertex without intravenous contrast. COMPARISON:  September 26, 2016 FINDINGS: Brain: There is age related volume loss, stable. There is no intracranial mass, hemorrhage, extra-axial fluid collection, or midline shift. There is slight small vessel disease in the centra semiovale bilaterally. No acute appearing infarct is demonstrable on this study. Vascular:  There is no appreciable hyperdense vessel. There is calcification in each distal vertebral artery as well as in each carotid siphon region. Skull: The bony calvarium appears intact. Sinuses/Orbits: There is mucosal thickening in several ethmoid air cells bilaterally. Other visualized paranasal sinuses are clear. There is evidence of prior scleral banding on the right. Orbits appear symmetric elsewhere bilaterally. Other: Mastoid air cells are clear. IMPRESSION: Age related volume loss with mild periventricular small vessel disease. No acute infarct evident. No mass or hemorrhage. Foci of arterial vascular calcification noted. Mucosal thickening noted in several ethmoid air cells. Scleral banding right globe. Electronically Signed   By: Lowella Grip III M.D.   On: 07/28/2018 09:36   Ct Renal Stone Study  Result Date: 07/28/2018 CLINICAL DATA:  Renal failure.  Vomiting.  Lactic acidosis. EXAM: CT ABDOMEN AND PELVIS WITHOUT CONTRAST TECHNIQUE: Multidetector CT imaging of the abdomen and pelvis was performed following the standard protocol without IV contrast. COMPARISON:  02/11/2018 FINDINGS: Lower chest: Moderate to diffuse coronary artery calcifications. Heart is normal size. Hepatobiliary: No focal hepatic abnormality.  Prior cholecystectomy Pancreas: No focal abnormality or ductal dilatation. Spleen: No focal abnormality.  Normal size. Adrenals/Urinary Tract: No adrenal abnormality. No focal renal abnormality. No stones or hydronephrosis. Bladder wall diverticula noted both right lateral and left lateral. The largest is on the right measuring up to 5.6 cm. Stomach/Bowel: Stomach, large and small bowel grossly unremarkable. Vascular/Lymphatic: Aortic atherosclerosis. No enlarged abdominal or pelvic lymph nodes. Reproductive: No visible focal abnormality. Other: No free fluid or free air. Musculoskeletal: No acute bony abnormality. IMPRESSION: No renal or ureteral stones. No hydronephrosis. Two bladder wall  diverticula, the largest on the right measuring up to 5.6 cm. Prior cholecystectomy. Aortic atherosclerosis. No acute findings in the abdomen or pelvis. Electronically Signed   By: Rolm Baptise M.D.   On: 07/28/2018 11:50    My personal review of EKG: Sinus tachycardia at 105, ventricular trigeminy, normal QTC  Assessment & Plan:    Principal Problem:   Sepsis secondary to UTI [facility]) Admit to telemetry.  Urine and blood culture sent from the ED.  Received empiric vancomycin and cefepime in the ED, will narrow to IV Rocephin for now and follow sensitivity. Received 2 L normal saline bolus in the ED.  Blood pressure currently stable and lactate normalized.  Maintain on D10 drip and once blood glucose better can be switched to D5 with normal saline. Tylenol PRN for fever.  Active Problems: Acute toxic metabolic encephalopathy Secondary to sepsis and hypoglycemia Continue neurochecks.  Mental status slowly appears to be clearing.  Avoid narcotics or benzos.     Type 1 diabetes mellitus with hypoglycemia without coma (HCC) On high-dose insulin (Toujeo 30 units at bedtime and U-100, 8 units 3 times a day).  Insulin on hold.  Monitor CBG every 4 hours and continue D10 drip.  Check A1c.  Hypoglycemia likely secondary to sepsis and poor p.o. intake.  Acute kidney injury on chronic kidney disease (CKD), stage III (moderate) (HCC) Baseline creatinine per previous labs are less than 1.  Acute kidney injury likely secondary to dehydration and sepsis.  Monitor with IV fluids.  Avoid nephrotoxins    Chronic myeloid leukemia [facility]) Sees hematologist ( Dr Cruzita Lederer) at [facility] and noncompliant with treatments.  Was on dasatinib but has not been on it since April this year.  Blood counts are currently stable.     Essential (primary) hypertension Stable.  Resume beta-blocker.  Elevated TSH (>7). Check free T4 for and T3.  Dyslipidemia Resume statin.  BPH Continue Proscar  GERD Continue  PPI  Hypokalemia and ventricular trigeminy No old EKG to compare.  On potassium supplement at home which will be resumed.  Also received IV potassium in the ED.  Check magnesium.   DVT Prophylaxis: Lovenox  AM Labs Ordered, also please review Full Orders  Family Communication: Admission, patients condition and plan of care including tests being ordered have been discussed with the patient at bedside.  Will update patient's son.   Code Status full code  Likely DC to likely home in the next 48-72 hours based on clinical improvement  Condition GUARDED  Consults called: None  Admission status: Inpatient  Patient presenting with sepsis secondary to UTI, persistent hypoglycemia and acute toxic metabolic encephalopathy for which he needs aggressive IV hydration, close monitoring with neuro checks and frequent blood glucose monitoring, empiric IV antibiotics and follow-up on culture which requires him to be monitored for >2 midnights.   Time spent in minutes : 70   Beauty Pless M.D on 07/28/2018 at 1:19 PM  Between 7am to 7pm - Pager - [phone]. After 7pm go to [email or web address] - password [facility]  [facility] - Office  [phone]

## 2018-07-29 DIAGNOSIS — N3000 Acute cystitis without hematuria: Secondary | ICD-10-CM | POA: Diagnosis not present

## 2018-07-29 DIAGNOSIS — E785 Hyperlipidemia, unspecified: Secondary | ICD-10-CM

## 2018-07-29 DIAGNOSIS — A419 Sepsis, unspecified organism: Secondary | ICD-10-CM | POA: Diagnosis not present

## 2018-07-29 DIAGNOSIS — G9341 Metabolic encephalopathy: Secondary | ICD-10-CM | POA: Diagnosis not present

## 2018-07-29 DIAGNOSIS — C9210 Chronic myeloid leukemia, BCR/ABL-positive, not having achieved remission: Secondary | ICD-10-CM | POA: Diagnosis not present

## 2018-07-29 LAB — BLOOD CULTURE ID PANEL (REFLEXED)
Acinetobacter baumannii: NOT DETECTED
CANDIDA ALBICANS: NOT DETECTED
Candida glabrata: NOT DETECTED
Candida krusei: NOT DETECTED
Candida parapsilosis: NOT DETECTED
Candida tropicalis: NOT DETECTED
ESCHERICHIA COLI: NOT DETECTED
Enterobacter cloacae complex: NOT DETECTED
Enterobacteriaceae species: NOT DETECTED
Enterococcus species: NOT DETECTED
Haemophilus influenzae: NOT DETECTED
Klebsiella oxytoca: NOT DETECTED
Klebsiella pneumoniae: NOT DETECTED
Listeria monocytogenes: NOT DETECTED
Methicillin resistance: DETECTED — AB
Neisseria meningitidis: NOT DETECTED
Proteus species: NOT DETECTED
Pseudomonas aeruginosa: NOT DETECTED
STREPTOCOCCUS PYOGENES: NOT DETECTED
Serratia marcescens: NOT DETECTED
Staphylococcus aureus (BCID): NOT DETECTED
Staphylococcus species: DETECTED — AB
Streptococcus agalactiae: NOT DETECTED
Streptococcus pneumoniae: NOT DETECTED
Streptococcus species: NOT DETECTED

## 2018-07-29 LAB — CBC
HCT: 32.3 % — ABNORMAL LOW (ref 39.0–52.0)
Hemoglobin: 10.7 g/dL — ABNORMAL LOW (ref 13.0–17.0)
MCH: 28.3 pg (ref 26.0–34.0)
MCHC: 33.1 g/dL (ref 30.0–36.0)
MCV: 85.4 fL (ref 80.0–100.0)
Platelets: 173 10*3/uL (ref 150–400)
RBC: 3.78 MIL/uL — ABNORMAL LOW (ref 4.22–5.81)
RDW: 13.1 % (ref 11.5–15.5)
WBC: 8.7 10*3/uL (ref 4.0–10.5)
nRBC: 0 % (ref 0.0–0.2)

## 2018-07-29 LAB — GLUCOSE, CAPILLARY
GLUCOSE-CAPILLARY: 346 mg/dL — AB (ref 70–99)
Glucose-Capillary: 296 mg/dL — ABNORMAL HIGH (ref 70–99)
Glucose-Capillary: 307 mg/dL — ABNORMAL HIGH (ref 70–99)
Glucose-Capillary: 210 mg/dL — ABNORMAL HIGH (ref 70–99)
Glucose-Capillary: 244 mg/dL — ABNORMAL HIGH (ref 70–99)

## 2018-07-29 LAB — BASIC METABOLIC PANEL
Anion gap: 8 (ref 5–15)
BUN: 24 mg/dL — AB (ref 8–23)
CO2: 26 mmol/L (ref 22–32)
Calcium: 8.4 mg/dL — ABNORMAL LOW (ref 8.9–10.3)
Chloride: 98 mmol/L (ref 98–111)
Creatinine, Ser: 1.14 mg/dL (ref 0.61–1.24)
GFR calc Af Amer: >60 mL/min (ref >60)
GFR calc non Af Amer: >60 mL/min (ref >60)
GLUCOSE: 312 mg/dL — AB (ref 70–99)
Potassium: 4.3 mmol/L (ref 3.5–5.1)
Sodium: 132 mmol/L — ABNORMAL LOW (ref 135–145)

## 2018-07-29 LAB — T3, FREE: T3, Free: 2.6 pg/mL (ref 2.0–4.4)

## 2018-07-29 MED ORDER — SODIUM CHLORIDE 0.9 % IV SOLN
INTRAVENOUS | Status: DC
  Administered 2018-07-29 – 2018-07-30 (×3): via INTRAVENOUS

## 2018-07-29 MED ORDER — INSULIN ASPART 100 UNIT/ML ~~LOC~~ SOLN
0.0000 [IU] | Freq: Every day | SUBCUTANEOUS | Status: DC
  Administered 2018-07-29: 2 [IU] via SUBCUTANEOUS

## 2018-07-29 MED ORDER — INSULIN ASPART 100 UNIT/ML ~~LOC~~ SOLN
0.0000 [IU] | Freq: Three times a day (TID) | SUBCUTANEOUS | Status: DC
  Administered 2018-07-29: 7 [IU] via SUBCUTANEOUS
  Administered 2018-07-30: 9 [IU] via SUBCUTANEOUS
  Administered 2018-07-30 (×2): 1 [IU] via SUBCUTANEOUS
  Administered 2018-07-31 (×2): 3 [IU] via SUBCUTANEOUS
  Administered 2018-07-31 – 2018-08-01 (×3): 2 [IU] via SUBCUTANEOUS

## 2018-07-29 NOTE — Progress Notes (Signed)
PROGRESS NOTE   Jerry [name]  LKG:[id number]    DOB: 1941-08-05    DOA: 07/28/2018  PCP: Patient, No Pcp Per   I have briefly reviewed patients previous medical records in [facility].  Brief Narrative:  77 year old male with PMH of poorly controlled DM 1/IDDM, HTN, CML (noncompliant with follow-up and treatment), chronic diastolic CHF, stage III chronic kidney disease with unknown baseline creatinine, HLD, hospitalized at [facility] for nausea, vomiting, diarrhea and abdominal pain secondary to UTI and for hyperglycemia without ketosis at which time urine culture grew Citrobacter and he was treated with IV ceftriaxone and discharged home on 11/7, subsequently seen by his PCP last month, presented to ED with confusion (hence poor history on admission), vomiting, chills, poor oral intake.  Family reported that patient lives alone, ambulates with a Rollator, independent of his ADLs and coherent at baseline.  In ED noted to be septic suspected due to UTI and persistent hypoglycemia.   Assessment & Plan:   Principal Problem:   Sepsis secondary to UTI [facility]) Active Problems:   Chronic kidney disease (CKD), stage III (moderate) (HCC)   Chronic myeloid leukemia (HCC)   Essential (primary) hypertension   Diabetic polyneuropathy ([place])   Hyperlipidemia LDL goal <100   Type 2 diabetes mellitus with hypoglycemia without coma ([place])   Acute metabolic encephalopathy   1. Sepsis suspected due to UTI: Received empiric vancomycin and cefepime in ED which was then narrowed to IV ceftriaxone.  He was bolused with normal saline in ED.  Sepsis physiology resolved.  Urine culture shows 50,000 colonies of unidentified organism, follow final cultures.  BCID/1 of 4 blood culture shows coagulase-negative staph (MRSA PCR negative), which may be a contaminant.  CT abdomen and pelvis without acute findings. 2. 1 of 4 blood cultures with coagulase-negative staph: Possibly contaminant.  Follow final blood culture  results. 3. Acute toxic metabolic encephalopathy: Secondary to UTI sepsis and hypoglycemia.  Mental status seems to have improved.  Delirium precautions.  Avoid precipitating medications.  CT head without acute findings. 4. Type I DM with hypoglycemia without coma: Patient on high-dose insulin's (Toujeo 30 units at bedtime and U- 100, 8 units 3 times a day.  Insulins were obviously held, resuscitated with D50 x2 but was persistently hypoglycemic and was placed on D10 infusion.  Hypoglycemia resolved and persistently hyperglycemic up to 307 this morning.  Discontinued D10 infusion.  Started diet.  Initiate NovoLog SSI and consider starting low-dose maintenance Lantus based on CBGs.  A1c of 13.2 suggests very poor outpatient control.?  Compliance. 5. Acute kidney injury on stage III chronic kidney disease: Presented with creatinine of 1.38.  This is normalized to 1.14.  Follow BMP periodically.  Avoid nephrotoxins. 6. CML: Sees hematologist (Dr. Cruzita Lederer) at [facility] and reportedly noncompliant with treatments.  Presented with leukocytosis which has resolved.  Outpatient follow-up. 7. Essential hypertension: Controlled.  Continue Toprol-XL. 8. BPH: Continue Proscar. 9. GERD: Continue PPI. 10. Hypokalemia: Replaced.  Magnesium 2.1. 11. Dyslipidemia 12. Elevated TSH: Suspect initial lab results were in error.  Repeat TSH, free T3 and free T4 are within normal limits. 13. Normocytic anemia: Suspect dilutional from IV fluids.  Follow CBC in a.m.   DVT prophylaxis: Lovenox Code Status: Full Family Communication: None at bedside. Disposition: DC home pending clinical improvement, hopefully in the next 48 hours.  PT evaluation   Consultants:  None  Procedures:  None  Antimicrobials:  IV ceftriaxone   Subjective: Alert and oriented x2 this morning.  Unsure why  he was admitted to the hospital.  "I thought I had a stomach bug".  Asking for regular diet.  No nausea, vomiting or abdominal pain  reported.  ROS: As above,  Objective:  Vitals:   07/28/18 1418 07/28/18 2005 07/29/18 0441 07/29/18 1255  BP:  105/62 (!) 126/58 (!) 109/54  Pulse:  96 90 82  Resp:  18 20 20   Temp:  98 F (36.7 C) 98 F (36.7 C) 98.3 F (36.8 C)  TempSrc:  Oral Oral Oral  SpO2:  99% 97% 99%  Weight: 70 kg     Height: 6\' 1"  (1.854 m)       Examination:  General exam: Pleasant elderly male, moderately built and nourished lying comfortably propped up in bed.  Oral mucosa moist. Respiratory system: Clear to auscultation. Respiratory effort normal. Cardiovascular system: S1 & S2 heard, RRR. No JVD, murmurs, rubs, gallops or clicks. No pedal edema.  Telemetry personally reviewed: Sinus rhythm. Gastrointestinal system: Abdomen is nondistended, soft and nontender. No organomegaly or masses felt. Normal bowel sounds heard. Central nervous system: Alert and oriented x2. No focal neurological deficits. Extremities: Symmetric 5 x 5 power. Skin: No rashes, lesions or ulcers Psychiatry: Judgement and insight appear impaired. Mood & affect appropriate.     Data Reviewed: I have personally reviewed following labs and imaging studies  CBC: Recent Labs  Lab 07/28/18 0907 07/28/18 0911 07/28/18 1430 07/29/18 0614  WBC 13.7*  --  17.5* 8.7  NEUTROABS 9.0*  --   --   --   HGB 12.6* 12.6* 12.1* 10.7*  HCT 37.3* 37.0* 35.4* 32.3*  MCV 83.6  --  83.5 85.4  PLT 275  --  203 009   Basic Metabolic Panel: Recent Labs  Lab 07/28/18 0907 07/28/18 0911 07/28/18 1430 07/29/18 0614  NA 137 138  --  132*  K 3.1* 3.1*  --  4.3  CL 97* 99  --  98  CO2 26  --   --  26  GLUCOSE 49* 44*  --  312*  BUN 29* 31*  --  24*  CREATININE 1.38* 1.40* 1.18 1.14  CALCIUM 9.4  --   --  8.4*  MG  --   --  2.1  --    Liver Function Tests: Recent Labs  Lab 07/28/18 0907  AST 32  ALT 22  ALKPHOS 101  BILITOT 0.9  PROT 8.2*  ALBUMIN 4.4   HbA1C: Recent Labs    07/28/18 1430  HGBA1C 13.2*   CBG: Recent  Labs  Lab 07/28/18 2115 07/28/18 2351 07/29/18 0411 07/29/18 0719 07/29/18 1058  GLUCAP 328* 184* 296* 307* 210*    Recent Results (from the past 240 hour(s))  Blood culture (routine x 2)     Status: None (Preliminary result)   Collection Time: 07/28/18  9:07 AM  Result Value Ref Range Status   Specimen Description   Final    BLOOD RIGHT ANTECUBITAL Performed at [facility] [address] [place], [place] [address]    Special Requests   Final    BOTTLES DRAWN AEROBIC AND ANAEROBIC Blood Culture adequate volume Performed at [facility] [address] [place], [place] [address]    Culture   Final    NO GROWTH 1 DAY Performed at [facility], [place] [address] [place], [place] [address]    Report Status PENDING  Incomplete  Blood culture (routine x 2)     Status: None (Preliminary result)  Collection Time: 07/28/18 10:23 AM  Result Value Ref Range Status   Specimen Description   Final    BLOOD LEFT HAND Performed at [facility] [address] [place], [place] [address]    Special Requests   Final    BOTTLES DRAWN AEROBIC AND ANAEROBIC Blood Culture results may not be optimal due to an inadequate volume of blood received in culture bottles Performed at [facility] [address], [place], [place] [address]    Culture  Setup Time   Final    AEROBIC BOTTLE ONLY Organism ID to follow GRAM POSITIVE COCCI CRITICAL RESULT CALLED TO, READ BACK BY AND VERIFIED WITH: Roland Earl PHARMD [id number] BY GF    Culture   Final    NO GROWTH 1 DAY Performed at [facility], [place] [address], [place], [place] [address]    Report Status PENDING  Incomplete  Blood Culture ID Panel (Reflexed)     Status: Abnormal   Collection Time: 07/28/18 10:23 AM  Result Value Ref Range Status   Enterococcus species NOT DETECTED NOT DETECTED Final   Listeria monocytogenes NOT DETECTED NOT DETECTED Final    Staphylococcus species DETECTED (A) NOT DETECTED Final    Comment: Methicillin (oxacillin) resistant coagulase negative staphylococcus. Possible blood culture contaminant (unless isolated from more than one blood culture draw or clinical case suggests pathogenicity). No antibiotic treatment is indicated for blood  culture contaminants. CRITICAL RESULT CALLED TO, READ BACK BY AND VERIFIED WITH: M SWIN PHARMD [id number] BY GF    Staphylococcus aureus (BCID) NOT DETECTED NOT DETECTED Final   Methicillin resistance DETECTED (A) NOT DETECTED Final    Comment: CRITICAL RESULT CALLED TO, READ BACK BY AND VERIFIED WITH: M SWIN PHARMD [id number] BY GF    Streptococcus species NOT DETECTED NOT DETECTED Final   Streptococcus agalactiae NOT DETECTED NOT DETECTED Final   Streptococcus pneumoniae NOT DETECTED NOT DETECTED Final   Streptococcus pyogenes NOT DETECTED NOT DETECTED Final   Acinetobacter baumannii NOT DETECTED NOT DETECTED Final   Enterobacteriaceae species NOT DETECTED NOT DETECTED Final   Enterobacter cloacae complex NOT DETECTED NOT DETECTED Final   Escherichia coli NOT DETECTED NOT DETECTED Final   Klebsiella oxytoca NOT DETECTED NOT DETECTED Final   Klebsiella pneumoniae NOT DETECTED NOT DETECTED Final   Proteus species NOT DETECTED NOT DETECTED Final   Serratia marcescens NOT DETECTED NOT DETECTED Final   Haemophilus influenzae NOT DETECTED NOT DETECTED Final   Neisseria meningitidis NOT DETECTED NOT DETECTED Final   Pseudomonas aeruginosa NOT DETECTED NOT DETECTED Final   Candida albicans NOT DETECTED NOT DETECTED Final   Candida glabrata NOT DETECTED NOT DETECTED Final   Candida krusei NOT DETECTED NOT DETECTED Final   Candida parapsilosis NOT DETECTED NOT DETECTED Final   Candida tropicalis NOT DETECTED NOT DETECTED Final    Comment: Performed at [facility], [address] [place], [place] [address]  Urine culture     Status: Abnormal (Preliminary result)    Collection Time: 07/28/18 12:28 PM  Result Value Ref Range Status   Specimen Description   Final    URINE, RANDOM Performed at [facility] [address] [place], [place] [address]    Special Requests   Final    NONE Performed at [facility], [place] [address], [place],  [address]    Culture (A)  Final    50,000 COLONIES/mL UNIDENTIFIED ORGANISM CULTURE REINCUBATED FOR BETTER  GROWTH Performed at [facility], [place] [address] [place], [place] [address]    Report Status PENDING  Incomplete  MRSA PCR Screening     Status: None   Collection Time: 07/28/18  2:45 PM  Result Value Ref Range Status   MRSA by PCR NEGATIVE NEGATIVE Final    Comment:        The GeneXpert MRSA Assay (FDA approved for NASAL specimens only), is one component of a comprehensive MRSA colonization surveillance program. It is not intended to diagnose MRSA infection nor to guide or monitor treatment for MRSA infections. Performed at [facility], [place] [address], [place], [place] [address]          Radiology Studies: Dg Chest 2 View  Result Date: 07/28/2018 CLINICAL DATA:  Altered mental status with nausea and vomiting. Hypertension. EXAM: CHEST - 2 VIEW COMPARISON:  June 20, 2018 FINDINGS: No edema or consolidation. Heart is upper normal in size with pulmonary vascularity normal. No adenopathy. No bone lesions. IMPRESSION: No edema or consolidation.  Heart upper normal in size. Electronically Signed   By: Lowella Grip III M.D.   On: 07/28/2018 09:43   Ct Head Wo Contrast  Result Date: 07/28/2018 CLINICAL DATA:  Altered mental status with nausea and vomiting. EXAM: CT HEAD WITHOUT CONTRAST TECHNIQUE: Contiguous axial images were obtained from the base of the skull through the vertex without intravenous contrast. COMPARISON:  September 26, 2016 FINDINGS: Brain: There is age related volume loss, stable. There is no intracranial  mass, hemorrhage, extra-axial fluid collection, or midline shift. There is slight small vessel disease in the centra semiovale bilaterally. No acute appearing infarct is demonstrable on this study. Vascular: There is no appreciable hyperdense vessel. There is calcification in each distal vertebral artery as well as in each carotid siphon region. Skull: The bony calvarium appears intact. Sinuses/Orbits: There is mucosal thickening in several ethmoid air cells bilaterally. Other visualized paranasal sinuses are clear. There is evidence of prior scleral banding on the right. Orbits appear symmetric elsewhere bilaterally. Other: Mastoid air cells are clear. IMPRESSION: Age related volume loss with mild periventricular small vessel disease. No acute infarct evident. No mass or hemorrhage. Foci of arterial vascular calcification noted. Mucosal thickening noted in several ethmoid air cells. Scleral banding right globe. Electronically Signed   By: Lowella Grip III M.D.   On: 07/28/2018 09:36   Ct Renal Stone Study  Result Date: 07/28/2018 CLINICAL DATA:  Renal failure.  Vomiting.  Lactic acidosis. EXAM: CT ABDOMEN AND PELVIS WITHOUT CONTRAST TECHNIQUE: Multidetector CT imaging of the abdomen and pelvis was performed following the standard protocol without IV contrast. COMPARISON:  02/11/2018 FINDINGS: Lower chest: Moderate to diffuse coronary artery calcifications. Heart is normal size. Hepatobiliary: No focal hepatic abnormality.  Prior cholecystectomy Pancreas: No focal abnormality or ductal dilatation. Spleen: No focal abnormality.  Normal size. Adrenals/Urinary Tract: No adrenal abnormality. No focal renal abnormality. No stones or hydronephrosis. Bladder wall diverticula noted both right lateral and left lateral. The largest is on the right measuring up to 5.6 cm. Stomach/Bowel: Stomach, large and small bowel grossly unremarkable. Vascular/Lymphatic: Aortic atherosclerosis. No enlarged abdominal or pelvic  lymph nodes. Reproductive: No visible focal abnormality. Other: No free fluid or free air. Musculoskeletal: No acute bony abnormality. IMPRESSION: No renal or ureteral stones. No hydronephrosis. Two bladder wall diverticula, the largest on the right measuring up to 5.6 cm. Prior cholecystectomy. Aortic atherosclerosis. No acute findings in the abdomen or pelvis. Electronically Signed  By: Rolm Baptise M.D.   On: 07/28/2018 11:50        Scheduled Meds: . enoxaparin (LOVENOX) injection  40 mg Subcutaneous Q24H  . finasteride  5 mg Oral Daily  . insulin aspart  0-9 Units Subcutaneous Q4H  . metoprolol succinate  25 mg Oral Daily  . pantoprazole  40 mg Oral Daily  . potassium chloride SA  40 mEq Oral Daily   Continuous Infusions: . sodium chloride 75 mL/hr at 07/29/18 0859  . cefTRIAXone (ROCEPHIN)  IV Stopped (07/28/18 1832)     LOS: 1 day     Vernell Leep, MD, FACP, [facility]. [facility] Pager [phone]  If 7PM-7AM, please contact night-coverage [email or web address] Password [email or web address] 07/29/2018, 1:02 PM

## 2018-07-29 NOTE — Progress Notes (Signed)
PHARMACY - PHYSICIAN COMMUNICATION CRITICAL VALUE ALERT - BLOOD CULTURE IDENTIFICATION (BCID)  Jerry [name] is an 77 y.o. male who presented to [facility] on 07/28/2018 with a chief complaint of AMS, emesis.   Assessment:  Patient currently being treated for sepsis thought to be secondary to UTI.   Name of physician (or Provider) Contacted: Dr. Algis Liming  Current antibiotics: Ceftriaxone 1 g IV daily  Changes to prescribed antibiotics recommended: Vancomycin per treatment algorithm. MD assessed: 1/4 positive for staph species, mec A detected - likely contaminant. No change to current antibiotics per MD.  Results for orders placed or performed during the hospital encounter of 07/28/18  Blood Culture ID Panel (Reflexed) (Collected: 07/28/2018 10:23 AM)  Result Value Ref Range   Enterococcus species NOT DETECTED NOT DETECTED   Listeria monocytogenes NOT DETECTED NOT DETECTED   Staphylococcus species DETECTED (A) NOT DETECTED   Staphylococcus aureus (BCID) NOT DETECTED NOT DETECTED   Methicillin resistance DETECTED (A) NOT DETECTED   Streptococcus species NOT DETECTED NOT DETECTED   Streptococcus agalactiae NOT DETECTED NOT DETECTED   Streptococcus pneumoniae NOT DETECTED NOT DETECTED   Streptococcus pyogenes NOT DETECTED NOT DETECTED   Acinetobacter baumannii NOT DETECTED NOT DETECTED   Enterobacteriaceae species NOT DETECTED NOT DETECTED   Enterobacter cloacae complex NOT DETECTED NOT DETECTED   Escherichia coli NOT DETECTED NOT DETECTED   Klebsiella oxytoca NOT DETECTED NOT DETECTED   Klebsiella pneumoniae NOT DETECTED NOT DETECTED   Proteus species NOT DETECTED NOT DETECTED   Serratia marcescens NOT DETECTED NOT DETECTED   Haemophilus influenzae NOT DETECTED NOT DETECTED   Neisseria meningitidis NOT DETECTED NOT DETECTED   Pseudomonas aeruginosa NOT DETECTED NOT DETECTED   Candida albicans NOT DETECTED NOT DETECTED   Candida glabrata NOT DETECTED NOT DETECTED   Candida krusei NOT  DETECTED NOT DETECTED   Candida parapsilosis NOT DETECTED NOT DETECTED   Candida tropicalis NOT DETECTED NOT DETECTED    Lenis Noon, PharmD 07/29/2018  1:08 PM

## 2018-07-29 NOTE — Evaluation (Signed)
Physical Therapy Evaluation Patient Details Name: Jerry [name] MRN: [id number] DOB: 03-03-1941 Today's Date: 07/29/2018   History of Present Illness  77 yo male with signficant confusion and difficulty with safety awareness was admitted with AMS from sepsis with UTI, encephalopathy, MRSA screen on urine culture and renal failure with lactic acidosis.  He is referred to PT for unsafe gait, but has medical non compliance for several serious conditions.  PMHx:  B foot drop, DM, CHF, CML, CKD 3, atherosclerosis.  Clinical Impression  Pt was seen for assessment of movement and with several concerning findings including a scissoring pattern and B foot drop, pt is a higher fall risk.  He is home alone and has poor self awareness of his issues, mainly feeling he can manage just fine with no extra help.  Will recommend SNF to restore his strength and control of gait, but also may be a candidate for LTC and for a neurological consultation to see what the source of his gait changes are.  Follow acutely for the work to increase safety and recommend family look at his safety at home to see if they can support the idea of LTC.    Follow Up Recommendations SNF    Equipment Recommendations  None recommended by PT    Recommendations for Other Services       Precautions / Restrictions Precautions Precautions: Fall Precaution Comments: pt has been home alone with many falls Required Braces or Orthoses: (none despite foot drop) Restrictions Weight Bearing Restrictions: No      Mobility  Bed Mobility               General bed mobility comments: up in chair when PT arrived  Transfers Overall transfer level: Needs assistance Equipment used: Rolling walker (2 wheeled);1 person hand held assist Transfers: Sit to/from Stand Sit to Stand: Min assist         General transfer comment: assist to control balance from sitting  Ambulation/Gait Ambulation/Gait assistance: Min assist;Min guard Gait  Distance (Feet): 60 Feet Assistive device: Rolling walker (2 wheeled);1 person hand held assist(one person to handle IV as pt is impulsive and unsafe) Gait Pattern/deviations: Step-through pattern;Scissoring;Trunk flexed;Narrow base of support;Staggering right;Staggering left;Ataxic(has difficulty clearing feet and has tendency to step on toe) Gait velocity: reduced Gait velocity interpretation: <1.8 ft/sec, indicate of risk for recurrent falls General Gait Details: gait changes appear to be neurological in origin  Stairs            Wheelchair Mobility    Modified Rankin (Stroke Patients Only)       Balance Overall balance assessment: History of Falls                                           Pertinent Vitals/Pain Pain Assessment: No/denies pain    Home Living Family/patient expects to be discharged to:: Unsure Living Arrangements: Alone               Additional Comments: has been home using a rollator and reports a great deal of falling    Prior Function Level of Independence: Needs assistance(home using rollator and falling)   Gait / Transfers Assistance Needed: rollator walker with falling during turns  ADL's / Homemaking Assistance Needed: home alone and not clear how well pt is handling these tasks        Hand Dominance  Extremity/Trunk Assessment   Upper Extremity Assessment Upper Extremity Assessment: Generalized weakness    Lower Extremity Assessment Lower Extremity Assessment: Generalized weakness    Cervical / Trunk Assessment Cervical / Trunk Assessment: Kyphotic  Communication   Communication: Expressive difficulties(speech is not clear)  Cognition Arousal/Alertness: Awake/alert Behavior During Therapy: Impulsive Overall Cognitive Status: No family/caregiver present to determine baseline cognitive functioning                                 General Comments: pt is not clear on what is unsafe and  no family there to see if this is pre-existing      General Comments      Exercises     Assessment/Plan    PT Assessment Patient needs continued PT services  PT Problem List Decreased strength;Decreased range of motion;Decreased activity tolerance;Decreased balance;Decreased mobility;Decreased coordination;Decreased cognition;Decreased knowledge of use of DME;Decreased safety awareness;Cardiopulmonary status limiting activity       PT Treatment Interventions DME instruction;Gait training;Functional mobility training;Therapeutic activities;Therapeutic exercise;Balance training;Neuromuscular re-education;Patient/family education    PT Goals (Current goals can be found in the Care Plan section)  Acute Rehab PT Goals Patient Stated Goal: to get to walk more PT Goal Formulation: With patient Time For Goal Achievement: 08/12/18 Potential to Achieve Goals: Fair    Frequency Min 2X/week   Barriers to discharge Decreased caregiver support home with falls and no help    Co-evaluation               AM-PAC PT "6 Clicks" Mobility  Outcome Measure Help needed turning from your back to your side while in a flat bed without using bedrails?: A Little Help needed moving from lying on your back to sitting on the side of a flat bed without using bedrails?: A Lot Help needed moving to and from a bed to a chair (including a wheelchair)?: A Lot Help needed standing up from a chair using your arms (e.g., wheelchair or bedside chair)?: A Lot Help needed to walk in hospital room?: A Lot Help needed climbing 3-5 steps with a railing? : Total 6 Click Score: 12    End of Session Equipment Utilized During Treatment: Gait belt Activity Tolerance: Patient limited by fatigue;Treatment limited secondary to medical complications (Comment) Patient left: in chair;with call bell/phone within reach;with chair alarm set Nurse Communication: Mobility status PT Visit Diagnosis: Unsteadiness on feet  (R26.81);History of falling (Z91.81);Muscle weakness (generalized) (M62.81)    Time: 6659-9357 PT Time Calculation (min) (ACUTE ONLY): 32 min   Charges:   PT Evaluation $PT Eval Moderate Complexity: 1 Mod PT Treatments $Gait Training: 8-22 mins        Ramond Dial 07/29/2018, 4:56 PM   Mee Hives, PT MS Acute Rehab Dept. Number: [place] and [place]

## 2018-07-29 NOTE — Plan of Care (Signed)
Patient tolerating carb modified diet without complaint, eating well.  Patient up with 2 max assist from nursing staff and walker, very unsteady gait.  Patient alert and oriented but impulsive.  Son in to visit this shift.  Patient voids small amounts frequently, wears a depends at his request (like he does at home) which is frequently soaked.

## 2018-07-30 DIAGNOSIS — N3000 Acute cystitis without hematuria: Secondary | ICD-10-CM | POA: Diagnosis not present

## 2018-07-30 DIAGNOSIS — G9341 Metabolic encephalopathy: Secondary | ICD-10-CM | POA: Diagnosis not present

## 2018-07-30 DIAGNOSIS — C9210 Chronic myeloid leukemia, BCR/ABL-positive, not having achieved remission: Secondary | ICD-10-CM | POA: Diagnosis not present

## 2018-07-30 DIAGNOSIS — A419 Sepsis, unspecified organism: Secondary | ICD-10-CM | POA: Diagnosis not present

## 2018-07-30 LAB — GLUCOSE, CAPILLARY
Glucose-Capillary: 383 mg/dL — ABNORMAL HIGH (ref 70–99)
Glucose-Capillary: 134 mg/dL — ABNORMAL HIGH (ref 70–99)

## 2018-07-30 LAB — URINE CULTURE: Culture: 50000 — AB

## 2018-07-30 LAB — BASIC METABOLIC PANEL
ANION GAP: 7 (ref 5–15)
BUN: 26 mg/dL — ABNORMAL HIGH (ref 8–23)
CO2: 24 mmol/L (ref 22–32)
Calcium: 8.2 mg/dL — ABNORMAL LOW (ref 8.9–10.3)
Chloride: 104 mmol/L (ref 98–111)
Creatinine, Ser: 1.02 mg/dL (ref 0.61–1.24)
GFR calc Af Amer: >60 mL/min (ref >60)
GFR calc non Af Amer: >60 mL/min (ref >60)
Glucose, Bld: 334 mg/dL — ABNORMAL HIGH (ref 70–99)
Potassium: 4.4 mmol/L (ref 3.5–5.1)
Sodium: 135 mmol/L (ref 135–145)

## 2018-07-30 LAB — CBC
HCT: 30.4 % — ABNORMAL LOW (ref 39.0–52.0)
Hemoglobin: 9.9 g/dL — ABNORMAL LOW (ref 13.0–17.0)
MCH: 28.7 pg (ref 26.0–34.0)
MCHC: 32.6 g/dL (ref 30.0–36.0)
MCV: 88.1 fL (ref 80.0–100.0)
NRBC: 0 % (ref 0.0–0.2)
Platelets: 146 10*3/uL — ABNORMAL LOW (ref 150–400)
RBC: 3.45 MIL/uL — ABNORMAL LOW (ref 4.22–5.81)
RDW: 13 % (ref 11.5–15.5)
WBC: 5.7 10*3/uL (ref 4.0–10.5)

## 2018-07-30 MED ORDER — TAMSULOSIN HCL 0.4 MG PO CAPS
0.4000 mg | ORAL_CAPSULE | Freq: Every day | ORAL | Status: DC
  Administered 2018-07-30 – 2018-08-01 (×3): 0.4 mg via ORAL
  Filled 2018-07-30 (×3): qty 1

## 2018-07-30 MED ORDER — CEPHALEXIN 500 MG PO CAPS
500.0000 mg | ORAL_CAPSULE | Freq: Three times a day (TID) | ORAL | Status: DC
  Administered 2018-07-30 – 2018-08-01 (×7): 500 mg via ORAL
  Filled 2018-07-30 (×7): qty 1

## 2018-07-30 MED ORDER — INSULIN GLARGINE 100 UNIT/ML ~~LOC~~ SOLN
15.0000 [IU] | Freq: Two times a day (BID) | SUBCUTANEOUS | Status: DC
  Administered 2018-07-30 – 2018-07-31 (×3): 15 [IU] via SUBCUTANEOUS
  Filled 2018-07-30 (×3): qty 0.15

## 2018-07-30 MED ORDER — INSULIN ASPART 100 UNIT/ML ~~LOC~~ SOLN
5.0000 [IU] | Freq: Three times a day (TID) | SUBCUTANEOUS | Status: DC
  Administered 2018-07-30 – 2018-08-01 (×7): 5 [IU] via SUBCUTANEOUS

## 2018-07-30 MED ORDER — TRAMADOL HCL 50 MG PO TABS
50.0000 mg | ORAL_TABLET | Freq: Once | ORAL | Status: AC
  Administered 2018-07-30: 50 mg via ORAL
  Filled 2018-07-30: qty 1

## 2018-07-30 NOTE — NC FL2 (Signed)
[place] LEVEL OF CARE SCREENING TOOL     IDENTIFICATION  Patient Name: Jerry [name] Birthdate: 02-28-1941 Sex: male Admission Date (Current Location): 07/28/2018  [facility] and [place] Number:  Herbalist and Address:  [facility],  [place] [address], [place]      Provider Number: [id number]  Attending Physician Name and Address:  Modena Jansky, MD  Relative Name and Phone Number:  Leonardo Makris: [phone]    Current Level of Care: Hospital Recommended Level of Care: [place] Prior Approval Number:    Date Approved/Denied:   PASRR Number: [id number]  Discharge Plan: SNF    Current Diagnoses: Patient Active Problem List   Diagnosis Date Noted  . Sepsis secondary to UTI ([place]) 07/28/2018  . Type 2 diabetes mellitus with hypoglycemia without coma ([place]) 07/28/2018  . Acute metabolic encephalopathy 16/96/7893  . Abscess of forearm 09/25/2015  . Chest pain 09/25/2015  . Hyperlipidemia LDL goal <100 09/25/2015  . Infection with methicillin-resistant Staphylococcus aureus 09/19/2015  . Cellulitis of upper extremity 09/15/2015  . Fall in home 09/15/2015  . Muscle ache 09/15/2015  . Type 2 diabetes mellitus with hyperglycemia ([place]) 09/15/2015  . Avitaminosis D 08/05/2015  . Chronic kidney disease (CKD), stage III (moderate) ([place]) 05/13/2015  . Long term current use of insulin ([place]) 05/13/2015  . Lumbar radiculopathy 04/15/2015  . LBP (low back pain) 11/05/2014  . Excessive falling 09/25/2014  . CAD in native artery 07/26/2014  . Congestive heart failure ([place]) 07/26/2014  . 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 Accumulation of fluid in tissues 11/05/2013  . Essential (primary) hypertension 11/05/2013  . Anemia in chronic illness 10/12/2013  . Dizziness 07/31/2013  . Allergic rhinitis 01/20/2013  . Anxiety state 01/20/2013  . Carpal tunnel syndrome 01/20/2013  . 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  Chronic myeloid leukemia ([place]) 01/20/2013  . 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 Acid reflux 01/20/2013  . Diabetic polyneuropathy ([place]) 01/20/2013  . Restless leg 01/20/2013  . Detached retina 01/20/2013  . Absence of bladder continence 01/20/2013  . Bladder neck obstruction 11/29/2012    Orientation RESPIRATION BLADDER Height & Weight     Self, Time, Situation, Place  Normal Incontinent Weight: 154 lb 5.2 oz (70 kg) Height:  6\' 1"  (185.4 cm)  BEHAVIORAL SYMPTOMS/MOOD NEUROLOGICAL BOWEL NUTRITION STATUS      Continent Diet(Carb modified)  AMBULATORY STATUS COMMUNICATION OF NEEDS Skin   Extensive Assist Verbally Normal                       Personal Care Assistance Level of Assistance  Bathing, Feeding, Dressing Bathing Assistance: Limited assistance Feeding assistance: Limited assistance Dressing Assistance: Limited assistance     Functional Limitations Info  Sight, Hearing, Speech Sight Info: Adequate Hearing Info: Adequate Speech Info: Adequate    SPECIAL CARE FACTORS FREQUENCY  OT (By licensed OT), PT (By licensed PT)     PT Frequency: 5x/week OT Frequency: 5x/week            Contractures Contractures Info: Not present    Additional Factors Info  Code Status, Allergies Code Status Info: Full Allergies Info: INFLUENZA VACCINES, ASPIRIN, CYCLOBENZAPRINE, DEXTROMETHORPHAN, DOXYCYCLINE, FLU VIRUS VACCINE, GABAPENTIN, GLIPIZIDE, HYDROCODONE-ACETAMINOPHEN, IODINATED DIAGNOSTIC AGENTS, LATEX, LISINOPRIL, PREGABALIN, ROPINIROLE, SALICYLATES, SULFAMETHOXAZOLE-TRIMETHOPRIM, OXYCODONE-ACETAMINOPHEN, PENICILLINS            Current Medications (07/30/2018):  This is the current hospital active medication list Current Facility-Administered Medications  Medication  Dose Route Frequency Provider Last Rate Last Dose  . acetaminophen (TYLENOL) tablet 650 mg  650 mg Oral Q6H PRN Dhungel, Nishant, MD   650 mg at 07/30/18 0314   Or  . acetaminophen  (TYLENOL) suppository 650 mg  650 mg Rectal Q6H PRN Dhungel, Nishant, MD      . cefTRIAXone (ROCEPHIN) 1 g in sodium chloride 0.9 % 100 mL IVPB  1 g Intravenous Q24H Dhungel, Nishant, MD 200 mL/hr at 07/29/18 1718 1 g at 07/29/18 1718  . enoxaparin (LOVENOX) injection 40 mg  40 mg Subcutaneous Q24H Dhungel, Nishant, MD   40 mg at 07/29/18 1504  . finasteride (PROSCAR) tablet 5 mg  5 mg Oral Daily Dhungel, Nishant, MD   5 mg at 07/30/18 0840  . fluticasone (FLONASE) 50 MCG/ACT nasal spray 1 spray  1 spray Each Nare Daily PRN Dhungel, Nishant, MD      . insulin aspart (novoLOG) injection 0-5 Units  0-5 Units Subcutaneous QHS Modena Jansky, MD   2 Units at 07/29/18 2141  . insulin aspart (novoLOG) injection 0-9 Units  0-9 Units Subcutaneous TID WC Modena Jansky, MD   1 Units at 07/30/18 1352  . insulin aspart (novoLOG) injection 5 Units  5 Units Subcutaneous TID WC Modena Jansky, MD   5 Units at 07/30/18 1352  . insulin glargine (LANTUS) injection 15 Units  15 Units Subcutaneous BID Modena Jansky, MD   15 Units at 07/30/18 1011  . metoprolol succinate (TOPROL-XL) 24 hr tablet 25 mg  25 mg Oral Daily Dhungel, Nishant, MD   25 mg at 07/30/18 1008  . nitroGLYCERIN (NITROSTAT) SL tablet 0.4 mg  0.4 mg Sublingual Q5 min PRN Dhungel, Nishant, MD      . ondansetron (ZOFRAN) tablet 4 mg  4 mg Oral Q6H PRN Dhungel, Nishant, MD       Or  . ondansetron (ZOFRAN) injection 4 mg  4 mg Intravenous Q6H PRN Dhungel, Nishant, MD      . pantoprazole (PROTONIX) EC tablet 40 mg  40 mg Oral Daily Dhungel, Nishant, MD   40 mg at 07/30/18 0840  . potassium chloride SA (K-DUR,KLOR-CON) CR tablet 40 mEq  40 mEq Oral Daily Dhungel, Nishant, MD   40 mEq at 07/30/18 0840  . tamsulosin (FLOMAX) capsule 0.4 mg  0.4 mg Oral Daily Modena Jansky, MD   0.4 mg at 07/30/18 1779     Discharge Medications: Please see discharge summary for a list of discharge medications.  Relevant Imaging Results:  Relevant Lab  Results:   Additional Information SSN: [id number]  Pricilla Holm, [place]

## 2018-07-30 NOTE — Clinical Social Work Note (Addendum)
Clinical Social Work Assessment  Patient Details  Name: Jerry [name] MRN: [id number] Date of Birth: [birth year]  Date of referral:  07/30/18               Reason for consult:  Facility Placement                Permission sought to share information with:  Facility Art therapist granted to share information::  Yes, Verbal Permission Granted  Name::     Hillery Bhalla  Agency::  SNF  Relationship::  Son  Contact Information:  [phone]  Housing/Transportation Living arrangements for the past 2 months:  [place] of Information:  Patient Patient Interpreter Needed:  None Criminal Activity/Legal Involvement Pertinent to Current Situation/Hospitalization:  No - Comment as needed Significant Relationships:  Adult Children Lives with:  Self Do you feel safe going back to the place where you live?  Yes Need for family participation in patient care:  Yes (Comment)  Care giving concerns:  Patient presented to ED with confusion, vomiting, chills, poor oral intake. Has PMH  of poorly controlled DM 1/IDDM, HTN, CML (noncompliant with follow-up and treatment), chronic diastolic CHF, stage III chronic kidney disease with unknown baseline creatinine. PT recommending short term therapy before returning home - patient lives alone.   Social Worker assessment / plan:  CSW met with patient at bedside to discuss d/c plan and role.  Patient reports he lives alone and has an aide who comes twice a week. He states he had no trouble getting around his home or completing ADLs PTA. Son lives in [place], works third shift, and is a support to patient.  Patient reluctantly agreeable to SNF placement at d/c. He reports he has been in many SNFs previously and states [facility] is his preference. Per RN, son reported [facility] as preference as well. He discussed multiple experiences at SNFs and recounted story of a poor experience he had at [facility] in [facility]. Patient prefers  to d/c home but is agreeable to rehab. CSW talked about the benefits of SNF therapy to encourage patient.   Attempted to call patient's son, Inocente Salles, at patient's request. Left voicemail for return call.  [employer] insurance auth needed. CSW completed FL2 and will send out referral.  Addendum 4:28PM: Spoke with patient's son, Inocente Salles, who confirmed [facility] as SNF choice. [name] stated patient has been there previously and it is in a good location for son and wife to visit. He did not give CSW permission to send referrals to any other facilities and asked to be called when [employer].   Employment status:  Retired Nurse, adult PT Recommendations:  [place] / Referral to community resources:  [place]  Patient/Family's Response to care:  Patient is not looking for to d/c to SNF but is agreeable. He was appreciative of CSW conversation and open to referral being made.  Patient/Family's Understanding of and Emotional Response to Diagnosis, Current Treatment, and Prognosis:  Patient believes he can manage okay at home. He understands PT recommendation for SNF.  Emotional Assessment Appearance:  Appears stated age Attitude/Demeanor/Rapport:    Affect (typically observed):  Accepting, Appropriate Orientation:  Oriented to Self, Oriented to Place, Oriented to  Time, Oriented to Situation Alcohol / Substance use:  Not Applicable Psych involvement (Current and /or in the community):  No (Comment)  Discharge Needs  Concerns to be addressed:  Care Coordination Readmission within the last 30 days:  No Current  discharge risk:  Lives alone, Physical Impairment Barriers to Discharge:  Continued Medical Work up, [employer], [place] 07/30/2018, 2:50 PM

## 2018-07-30 NOTE — Progress Notes (Signed)
PROGRESS NOTE   Jerry [name]  DGU:[id number]    DOB: [birth year]    DOA: 07/28/2018  PCP: Patient, No Pcp Per   I have briefly reviewed patients previous medical records in [facility].  Brief Narrative:  77 year old male with PMH of poorly controlled DM 1/IDDM, HTN, CML (noncompliant with follow-up and treatment), chronic diastolic CHF, stage III chronic kidney disease with unknown baseline creatinine, HLD, hospitalized at [facility] for nausea, vomiting, diarrhea and abdominal pain secondary to UTI and for hyperglycemia without ketosis at which time urine culture grew Citrobacter and he was treated with IV ceftriaxone and discharged home on 11/7, subsequently seen by his PCP last month, presented to ED with confusion (hence poor history on admission), vomiting, chills, poor oral intake.  Family reported that patient lives alone, ambulates with a Rollator, independent of his ADLs and coherent at baseline.  In ED noted to be septic suspected due to UTI and persistent hypoglycemia.   Assessment & Plan:   Principal Problem:   Sepsis secondary to UTI [facility]) Active Problems:   Chronic kidney disease (CKD), stage III (moderate) (HCC)   Chronic myeloid leukemia (HCC)   Essential (primary) hypertension   Diabetic polyneuropathy ([place])   Hyperlipidemia LDL goal <100   Type 2 diabetes mellitus with hypoglycemia without coma ([place])   Acute metabolic encephalopathy   1. Sepsis suspected due to UTI: Received empiric vancomycin and cefepime in ED which was then narrowed to IV ceftriaxone.  He was bolused with normal saline in ED.  Sepsis physiology resolved.  Urine culture shows 50,000 colonies of Citrobacter koseri.  Completed 2 days of IV ceftriaxone.  Changed to oral Keflex and complete total 7 days.  BCID/1 of 4 blood culture shows coagulase-negative staph (MRSA PCR negative), which is likely a contaminant.  CT abdomen and pelvis without acute findings. 2. 1 of 4 blood cultures with  coagulase-negative staph: Possibly contaminant.  Follow final blood culture results. 3. Acute toxic metabolic encephalopathy: Secondary to UTI sepsis and hypoglycemia.  Resolved.  Delirium precautions.  Avoid precipitating medications.  CT head without acute findings. 4. Type I DM with hypoglycemia without coma: Patient on high-dose insulin's (Toujeo 30 units at bedtime and U- 100, 8 units 3 times a day.  Insulins were obviously held, resuscitated with D50 x2 but was persistently hypoglycemic and was placed on D10 infusion.  Hypoglycemia resolved and persistently hyperglycemic up to 307 this morning.  Discontinued D10 infusion.  Started diet.  Initiate NovoLog SSI and consider starting low-dose maintenance Lantus based on CBGs.  A1c of 13.2 suggests very poor outpatient control.?  Compliance.  Now hyperglycemic with FBG 383.  Started back on Lantus 15 units twice daily today, transition back to 30 units at bedtime at discharge, continue NovoLog SSI, added NovoLog 5 units at mealtime 3 times daily.  Monitor closely and adjust as needed. 5. Acute kidney injury on stage III chronic kidney disease: Presented with creatinine of 1.38.  Acute kidney injury resolved..  Follow BMP periodically.  Avoid nephrotoxins. 6. CML: Sees hematologist (Dr. Cruzita Lederer) at [facility] and reportedly noncompliant with treatments.  Presented with leukocytosis which has resolved.  Outpatient follow-up. 7. Essential hypertension: Controlled.  Continue Toprol-XL. 8. BPH: Continue Proscar. 9. GERD: Continue PPI. 10. Hypokalemia: Replaced.  Magnesium 2.1. 11. Dyslipidemia 12. Elevated TSH: Suspect initial lab results were in error.  Repeat TSH, free T3 and free T4 are within normal limits. 13. Normocytic anemia: Suspect dilutional from IV fluids.  Follow CBC in a.m. hemoglobin is  dropped further from 10.7-9.9.  Follow CBC in a.m.  No bleeding reported.   DVT prophylaxis: Lovenox Code Status: Full Family Communication: None at  bedside. Disposition: DC to SNF possibly 12/16   Consultants:  None  Procedures:  None  Antimicrobials:  IV ceftriaxone   Subjective: Denies complaints.  Had to BMs in the last 12 hours.  Tolerating diet without nausea, vomiting.  Had some abdominal discomfort earlier which has resolved.  ROS: As above,  Objective:  Vitals:   07/29/18 1255 07/29/18 2109 07/30/18 0440 07/30/18 1006  BP: (!) 109/54 (!) 113/48 (!) 95/59 127/71  Pulse: 82 100 91 91  Resp: 20 20 20    Temp: 98.3 F (36.8 C) 98.5 F (36.9 C) 98.2 F (36.8 C)   TempSrc: Oral Oral Oral   SpO2: 99% 96% 99% 98%  Weight:      Height:        Examination:  General exam: Pleasant elderly male, moderately built and nourished seen ambulating with walker and supervision in the halls, unsteady gait.  Oral mucosa moist. Respiratory system: Clear to auscultation. Respiratory effort normal. Cardiovascular system: S1 & S2 heard, RRR. No JVD, murmurs, rubs, gallops or clicks. No pedal edema.  Stable Gastrointestinal system: Abdomen is nondistended, soft and nontender. No organomegaly or masses felt. Normal bowel sounds heard.  Stable Central nervous system: Alert and oriented x2. No focal neurological deficits. Extremities: Symmetric 5 x 5 power. Skin: No rashes, lesions or ulcers Psychiatry: Judgement and insight appear impaired. Mood & affect appropriate.     Data Reviewed: I have personally reviewed following labs and imaging studies  CBC: Recent Labs  Lab 07/28/18 0907 07/28/18 0911 07/28/18 1430 07/29/18 0614 07/30/18 0620  WBC 13.7*  --  17.5* 8.7 5.7  NEUTROABS 9.0*  --   --   --   --   HGB 12.6* 12.6* 12.1* 10.7* 9.9*  HCT 37.3* 37.0* 35.4* 32.3* 30.4*  MCV 83.6  --  83.5 85.4 88.1  PLT 275  --  203 173 696*   Basic Metabolic Panel: Recent Labs  Lab 07/28/18 0907 07/28/18 0911 07/28/18 1430 07/29/18 0614 07/30/18 0620  NA 137 138  --  132* 135  K 3.1* 3.1*  --  4.3 4.4  CL 97* 99  --  98  104  CO2 26  --   --  26 24  GLUCOSE 49* 44*  --  312* 334*  BUN 29* 31*  --  24* 26*  CREATININE 1.38* 1.40* 1.18 1.14 1.02  CALCIUM 9.4  --   --  8.4* 8.2*  MG  --   --  2.1  --   --    Liver Function Tests: Recent Labs  Lab 07/28/18 0907  AST 32  ALT 22  ALKPHOS 101  BILITOT 0.9  PROT 8.2*  ALBUMIN 4.4   HbA1C: Recent Labs    07/28/18 1430  HGBA1C 13.2*   CBG: Recent Labs  Lab 07/29/18 1058 07/29/18 1638 07/29/18 1958 07/30/18 0722 07/30/18 1138  GLUCAP 210* 346* 244* 383* 134*    Recent Results (from the past 240 hour(s))  Blood culture (routine x 2)     Status: None (Preliminary result)   Collection Time: 07/28/18  9:07 AM  Result Value Ref Range Status   Specimen Description   Final    BLOOD RIGHT ANTECUBITAL Performed at [facility], [place] [address], [place], [place] [address]    Special Requests   Final    BOTTLES  DRAWN AEROBIC AND ANAEROBIC Blood Culture adequate volume Performed at [place] [address], [place], [place] [address]    Culture   Final    NO GROWTH 1 DAY Performed at [facility], [place] [address], [place], [place] [address]    Report Status PENDING  Incomplete  Blood culture (routine x 2)     Status: Abnormal (Preliminary result)   Collection Time: 07/28/18 10:23 AM  Result Value Ref Range Status   Specimen Description   Final    BLOOD LEFT HAND Performed at [place] [address] [place], [place] [address]    Special Requests   Final    BOTTLES DRAWN AEROBIC AND ANAEROBIC Blood Culture results may not be optimal due to an inadequate volume of blood received in culture bottles Performed at [place] [address] [place], [place] [address]    Culture  Setup Time   Final    IN BOTH AEROBIC AND ANAEROBIC BOTTLES GRAM POSITIVE COCCI CRITICAL RESULT CALLED TO, READ BACK BY AND VERIFIED WITH: Roland Earl PHARMD [id number] BY  GF Performed at [facility], [place] [address], [place], [place] [address]    Culture STAPHYLOCOCCUS SPECIES (COAGULASE NEGATIVE) (A)  Final   Report Status PENDING  Incomplete  Blood Culture ID Panel (Reflexed)     Status: Abnormal   Collection Time: 07/28/18 10:23 AM  Result Value Ref Range Status   Enterococcus species NOT DETECTED NOT DETECTED Final   Listeria monocytogenes NOT DETECTED NOT DETECTED Final   Staphylococcus species DETECTED (A) NOT DETECTED Final    Comment: Methicillin (oxacillin) resistant coagulase negative staphylococcus. Possible blood culture contaminant (unless isolated from more than one blood culture draw or clinical case suggests pathogenicity). No antibiotic treatment is indicated for blood  culture contaminants. CRITICAL RESULT CALLED TO, READ BACK BY AND VERIFIED WITH: M SWIN PHARMD [id number] BY GF    Staphylococcus aureus (BCID) NOT DETECTED NOT DETECTED Final   Methicillin resistance DETECTED (A) NOT DETECTED Final    Comment: CRITICAL RESULT CALLED TO, READ BACK BY AND VERIFIED WITH: M SWIN PHARMD [id number] BY GF    Streptococcus species NOT DETECTED NOT DETECTED Final   Streptococcus agalactiae NOT DETECTED NOT DETECTED Final   Streptococcus pneumoniae NOT DETECTED NOT DETECTED Final   Streptococcus pyogenes NOT DETECTED NOT DETECTED Final   Acinetobacter baumannii NOT DETECTED NOT DETECTED Final   Enterobacteriaceae species NOT DETECTED NOT DETECTED Final   Enterobacter cloacae complex NOT DETECTED NOT DETECTED Final   Escherichia coli NOT DETECTED NOT DETECTED Final   Klebsiella oxytoca NOT DETECTED NOT DETECTED Final   Klebsiella pneumoniae NOT DETECTED NOT DETECTED Final   Proteus species NOT DETECTED NOT DETECTED Final   Serratia marcescens NOT DETECTED NOT DETECTED Final   Haemophilus influenzae NOT DETECTED NOT DETECTED Final   Neisseria meningitidis NOT DETECTED NOT DETECTED Final   Pseudomonas aeruginosa NOT DETECTED NOT DETECTED  Final   Candida albicans NOT DETECTED NOT DETECTED Final   Candida glabrata NOT DETECTED NOT DETECTED Final   Candida krusei NOT DETECTED NOT DETECTED Final   Candida parapsilosis NOT DETECTED NOT DETECTED Final   Candida tropicalis NOT DETECTED NOT DETECTED Final    Comment: Performed at [facility], [address], [place], [place] [address]  Urine culture     Status: Abnormal   Collection Time: 07/28/18 12:28 PM  Result Value Ref Range Status   Specimen  Description   Final    URINE, RANDOM Performed at [facility], [place] [address] [place], [place] [address]    Special Requests   Final    NONE Performed at [facility], [place] [address] [place], [place] [address]    Culture 50,000 COLONIES/mL CITROBACTER KOSERI (A)  Final   Report Status 07/30/2018 FINAL  Final   Organism ID, Bacteria CITROBACTER KOSERI (A)  Final      Susceptibility   Citrobacter koseri - MIC*    CEFAZOLIN <=4 SENSITIVE Sensitive     CEFTRIAXONE <=1 SENSITIVE Sensitive     CIPROFLOXACIN <=0.25 SENSITIVE Sensitive     GENTAMICIN <=1 SENSITIVE Sensitive     IMIPENEM <=0.25 SENSITIVE Sensitive     NITROFURANTOIN <=16 SENSITIVE Sensitive     TRIMETH/SULFA <=20 SENSITIVE Sensitive     PIP/TAZO 16 SENSITIVE Sensitive     * 50,000 COLONIES/mL CITROBACTER KOSERI  MRSA PCR Screening     Status: None   Collection Time: 07/28/18  2:45 PM  Result Value Ref Range Status   MRSA by PCR NEGATIVE NEGATIVE Final    Comment:        The GeneXpert MRSA Assay (FDA approved for NASAL specimens only), is one component of a comprehensive MRSA colonization surveillance program. It is not intended to diagnose MRSA infection nor to guide or monitor treatment for MRSA infections. Performed at [facility], [place] [address], [place], [place] [address]          Radiology Studies: No results found.      Scheduled Meds: . enoxaparin (LOVENOX) injection  40  mg Subcutaneous Q24H  . finasteride  5 mg Oral Daily  . insulin aspart  0-5 Units Subcutaneous QHS  . insulin aspart  0-9 Units Subcutaneous TID WC  . insulin aspart  5 Units Subcutaneous TID WC  . insulin glargine  15 Units Subcutaneous BID  . metoprolol succinate  25 mg Oral Daily  . pantoprazole  40 mg Oral Daily  . potassium chloride SA  40 mEq Oral Daily  . tamsulosin  0.4 mg Oral Daily   Continuous Infusions: . cefTRIAXone (ROCEPHIN)  IV 1 g (07/29/18 1718)     LOS: 2 days     Vernell Leep, MD, FACP, [facility]. [facility] Pager [phone]  If 7PM-7AM, please contact night-coverage [email or web address] Password [email or web address] 07/30/2018, 2:22 PM

## 2018-07-30 NOTE — Plan of Care (Signed)
Patient without complaint on 7 a to 7 p shift, tolerating carb mod diet.  Blood sugars much better controlled with addition of meal coverage and Lantus.  Son at bedside for part of shift, wants patient to go to [facility] in [place] if possible for rehab (SW made aware).  Patient up and down several times this shift to chair and walked in hallway with walker and 2 assist.

## 2018-07-31 DIAGNOSIS — A419 Sepsis, unspecified organism: Secondary | ICD-10-CM | POA: Diagnosis not present

## 2018-07-31 DIAGNOSIS — C9210 Chronic myeloid leukemia, BCR/ABL-positive, not having achieved remission: Secondary | ICD-10-CM | POA: Diagnosis not present

## 2018-07-31 DIAGNOSIS — N3000 Acute cystitis without hematuria: Secondary | ICD-10-CM | POA: Diagnosis not present

## 2018-07-31 DIAGNOSIS — G9341 Metabolic encephalopathy: Secondary | ICD-10-CM | POA: Diagnosis not present

## 2018-07-31 LAB — CBC
HEMATOCRIT: 28.7 % — AB (ref 39.0–52.0)
HEMOGLOBIN: 9.3 g/dL — AB (ref 13.0–17.0)
MCH: 29 pg (ref 26.0–34.0)
MCHC: 32.4 g/dL (ref 30.0–36.0)
MCV: 89.4 fL (ref 80.0–100.0)
Platelets: 139 10*3/uL — ABNORMAL LOW (ref 150–400)
RBC: 3.21 MIL/uL — ABNORMAL LOW (ref 4.22–5.81)
RDW: 13 % (ref 11.5–15.5)
WBC: 5.5 10*3/uL (ref 4.0–10.5)
nRBC: 0 % (ref 0.0–0.2)

## 2018-07-31 LAB — GLUCOSE, CAPILLARY
GLUCOSE-CAPILLARY: 162 mg/dL — AB (ref 70–99)
Glucose-Capillary: 141 mg/dL — ABNORMAL HIGH (ref 70–99)
Glucose-Capillary: 127 mg/dL — ABNORMAL HIGH (ref 70–99)
Glucose-Capillary: 225 mg/dL — ABNORMAL HIGH (ref 70–99)
Glucose-Capillary: 238 mg/dL — ABNORMAL HIGH (ref 70–99)
Glucose-Capillary: 126 mg/dL — ABNORMAL HIGH (ref 70–99)

## 2018-07-31 LAB — CULTURE, BLOOD (ROUTINE X 2)

## 2018-07-31 MED ORDER — INSULIN GLARGINE 100 UNIT/ML ~~LOC~~ SOLN
30.0000 [IU] | Freq: Every day | SUBCUTANEOUS | Status: DC
  Administered 2018-08-01: 30 [IU] via SUBCUTANEOUS
  Filled 2018-07-31: qty 0.3

## 2018-07-31 MED ORDER — INSULIN GLARGINE 100 UNIT/ML ~~LOC~~ SOLN
15.0000 [IU] | Freq: Once | SUBCUTANEOUS | Status: AC
  Administered 2018-07-31: 15 [IU] via SUBCUTANEOUS
  Filled 2018-07-31 (×2): qty 0.15

## 2018-07-31 NOTE — Progress Notes (Signed)
PROGRESS NOTE   Jerry [name]  OXB:[id number]    DOB: 24-Mar-1941    DOA: 07/28/2018  PCP: Patient, No Pcp Per   I have briefly reviewed patients previous medical records in [facility].  Brief Narrative:  77 year old male with PMH of poorly controlled DM 1/IDDM, HTN, CML (noncompliant with follow-up and treatment), chronic diastolic CHF, stage III chronic kidney disease with unknown baseline creatinine, HLD, hospitalized at [facility] for nausea, vomiting, diarrhea and abdominal pain secondary to UTI and for hyperglycemia without ketosis at which time urine culture grew Citrobacter and he was treated with IV ceftriaxone and discharged home on 11/7, subsequently seen by his PCP last month, presented to ED with confusion (hence poor history on admission), vomiting, chills, poor oral intake.  Family reported that patient lives alone, ambulates with a Rollator, independent of his ADLs and coherent at baseline.  In ED noted to be septic suspected due to UTI and persistent hypoglycemia.   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 Sepsis suspected due to UTI: Received empiric vancomycin and cefepime in ED which was then narrowed to IV ceftriaxone.  He was bolused with normal saline in ED.  Sepsis physiology resolved.  Urine culture shows 50,000 colonies of Citrobacter koseri.  Completed 2 days of IV ceftriaxone.  Changed to oral Keflex and complete total 7 days.  BCID/1 of 4 blood culture shows coagulase-negative staph (MRSA PCR negative), which is likely a contaminant.  CT abdomen and pelvis without acute findings.  Stable. 2. 1 of 4 blood cultures with  coagulase-negative staph: Possibly contaminant.  Follow final blood culture results.  Stable. 3. Acute toxic metabolic encephalopathy: Secondary to UTI sepsis and hypoglycemia.  Resolved.  Delirium precautions.  Avoid precipitating medications.  CT head without acute findings. 4. Type I DM with hypoglycemia without coma: Patient on high-dose insulin's (Toujeo 30 units at bedtime and U- 100, 8 units 3 times a day.  Insulins were obviously held, resuscitated with D50 x2 but was persistently hypoglycemic and was placed on D10 infusion.  Hypoglycemia resolved and persistently hyperglycemic up to 307 this morning.  Discontinued D10 infusion.  Started diet.  Initiate NovoLog SSI and consider starting low-dose maintenance Lantus based on CBGs.  A1c of 13.2 suggests very poor outpatient control.?  Compliance.  Now transitioned to Lantus 30 units daily, continue SSI and mealtime NovoLog.  Improved control. 5. Acute kidney injury on stage III chronic kidney disease: Presented with creatinine of 1.38.  Acute kidney injury resolved..  Follow BMP periodically.  Avoid nephrotoxins. 6. CML: Sees hematologist (Dr. Cruzita Lederer) at [facility] and reportedly noncompliant with treatments.  Presented with leukocytosis which has resolved.  Outpatient follow-up. 7. Essential hypertension: Controlled.  Continue Toprol-XL. 8. BPH: Continue Proscar. 9. GERD: Continue PPI. 10. Hypokalemia: Replaced.  Magnesium 2.1. 11. Dyslipidemia 12. Elevated TSH: Suspect initial lab results were in error.  Repeat TSH, free T3 and free T4 are within normal limits. 13. Normocytic anemia: Suspect dilutional from IV fluids.  Follow CBC in a.m. hemoglobin is dropped further from 10.7-9.9.  Follow CBC in a.m.  No bleeding reported.  Stable. 14. Thrombocytopenia: Related to infection versus antibiotics.  Follow  CBC in a.m.   DVT prophylaxis: Lovenox Code Status: Full Family Communication: None at bedside. Disposition: DC to SNF pending insurance  authorization, hopefully 12/17.   Consultants:  None  Procedures:  None  Antimicrobials:  IV ceftriaxone   Subjective: Denies complaints.  States that he will only stay at [facility] for a limited period of time and does not plan to be there long-term.  ROS: As above,  Objective:  Vitals:   07/31/18 0450 07/31/18 0936 07/31/18 1343 07/31/18 1431  BP: (!) 99/54 (!) 105/54 90/65 105/68  Pulse: 77  79   Resp: 18     Temp: 98.1 F (36.7 C)  99.1 F (37.3 C)   TempSrc: Oral  Oral   SpO2: 100%  98%   Weight:      Height:        Examination: No significant change in clinical exam compared to yesterday.  General exam: Pleasant elderly male, moderately built and nourished seen ambulating with walker and supervision in the halls, unsteady gait.  Oral mucosa moist. Respiratory system: Clear to auscultation. Respiratory effort normal. Cardiovascular system: S1 & S2 heard, RRR. No JVD, murmurs, rubs, gallops or clicks. No pedal edema.  Stable Gastrointestinal system: Abdomen is nondistended, soft and nontender. No organomegaly or masses felt. Normal bowel sounds heard.  Stable Central nervous system: Alert and oriented x2. No focal neurological deficits. Extremities: Symmetric 5 x 5 power. Skin: No rashes, lesions or ulcers Psychiatry: Judgement and insight appear impaired. Mood & affect appropriate.     Data Reviewed: I have personally reviewed following labs and imaging studies  CBC: Recent Labs  Lab 07/28/18 0907 07/28/18 0911 07/28/18 1430 07/29/18 0614 07/30/18 0620 07/31/18 0527  WBC 13.7*  --  17.5* 8.7 5.7 5.5  NEUTROABS 9.0*  --   --   --   --   --   HGB 12.6* 12.6* 12.1* 10.7* 9.9* 9.3*  HCT 37.3* 37.0* 35.4* 32.3* 30.4* 28.7*  MCV 83.6  --  83.5 85.4 88.1 89.4  PLT 275  --  203 173 146* 242*   Basic Metabolic Panel: Recent Labs  Lab 07/28/18 0907 07/28/18 0911 07/28/18 1430 07/29/18 0614 07/30/18 0620  NA 137 138  --  132* 135  K 3.1* 3.1*  --  4.3 4.4   CL 97* 99  --  98 104  CO2 26  --   --  26 24  GLUCOSE 49* 44*  --  312* 334*  BUN 29* 31*  --  24* 26*  CREATININE 1.38* 1.40* 1.18 1.14 1.02  CALCIUM 9.4  --   --  8.4* 8.2*  MG  --   --  2.1  --   --    Liver Function Tests: Recent Labs  Lab 07/28/18 0907  AST 32  ALT 22  ALKPHOS 101  BILITOT 0.9  PROT 8.2*  ALBUMIN 4.4   HbA1C: No results for input(s): HGBA1C in the last 72 hours. CBG: Recent Labs  Lab 07/30/18 1649 07/30/18 2017 07/31/18 0745 07/31/18 1159 07/31/18 1657  GLUCAP 141* 127* 225* 238* 162*    Recent Results (from the past 240 hour(s))  Blood culture (routine x 2)     Status: None (Preliminary result)   Collection Time: 07/28/18  9:07 AM  Result Value Ref Range Status   Specimen Description   Final    BLOOD RIGHT ANTECUBITAL Performed at [facility] [address] [place], [place] [address]    Special Requests  Final    BOTTLES DRAWN AEROBIC AND ANAEROBIC Blood Culture adequate volume Performed at [facility] [address] [place], [place] [address]    Culture   Final    NO GROWTH 3 DAYS Performed at [facility], [place] [address] [place], [place] [address]    Report Status PENDING  Incomplete  Blood culture (routine x 2)     Status: Abnormal   Collection Time: 07/28/18 10:23 AM  Result Value Ref Range Status   Specimen Description   Final    BLOOD LEFT HAND Performed at [facility] [address] [place], [place] [address]    Special Requests   Final    BOTTLES DRAWN AEROBIC AND ANAEROBIC Blood Culture results may not be optimal due to an inadequate volume of blood received in culture bottles Performed at [facility] [address], [place], [place] [address]    Culture  Setup Time   Final    IN BOTH AEROBIC AND ANAEROBIC BOTTLES GRAM POSITIVE COCCI CRITICAL RESULT CALLED TO, READ BACK BY AND VERIFIED WITH: M SWIN PHARMD [id number] BY GF      Culture (A)  Final    STAPHYLOCOCCUS SPECIES (COAGULASE NEGATIVE) THE SIGNIFICANCE OF ISOLATING THIS ORGANISM FROM A SINGLE SET OF BLOOD CULTURES WHEN MULTIPLE SETS ARE DRAWN IS UNCERTAIN. PLEASE NOTIFY THE MICROBIOLOGY DEPARTMENT WITHIN ONE WEEK IF SPECIATION AND SENSITIVITIES ARE REQUIRED. Performed at [facility], [place] [address] [place], [place] [address]    Report Status 07/31/2018 FINAL  Final  Blood Culture ID Panel (Reflexed)     Status: Abnormal   Collection Time: 07/28/18 10:23 AM  Result Value Ref Range Status   Enterococcus species NOT DETECTED NOT DETECTED Final   Listeria monocytogenes NOT DETECTED NOT DETECTED Final   Staphylococcus species DETECTED (A) NOT DETECTED Final    Comment: Methicillin (oxacillin) resistant coagulase negative staphylococcus. Possible blood culture contaminant (unless isolated from more than one blood culture draw or clinical case suggests pathogenicity). No antibiotic treatment is indicated for blood  culture contaminants. CRITICAL RESULT CALLED TO, READ BACK BY AND VERIFIED WITH: M SWIN PHARMD [id number] BY GF    Staphylococcus aureus (BCID) NOT DETECTED NOT DETECTED Final   Methicillin resistance DETECTED (A) NOT DETECTED Final    Comment: CRITICAL RESULT CALLED TO, READ BACK BY AND VERIFIED WITH: M SWIN PHARMD [id number] BY GF    Streptococcus species NOT DETECTED NOT DETECTED Final   Streptococcus agalactiae NOT DETECTED NOT DETECTED Final   Streptococcus pneumoniae NOT DETECTED NOT DETECTED Final   Streptococcus pyogenes NOT DETECTED NOT DETECTED Final   Acinetobacter baumannii NOT DETECTED NOT DETECTED Final   Enterobacteriaceae species NOT DETECTED NOT DETECTED Final   Enterobacter cloacae complex NOT DETECTED NOT DETECTED Final   Escherichia coli NOT DETECTED NOT DETECTED Final   Klebsiella oxytoca NOT DETECTED NOT DETECTED Final   Klebsiella pneumoniae NOT DETECTED NOT DETECTED Final   Proteus species NOT DETECTED NOT  DETECTED Final   Serratia marcescens NOT DETECTED NOT DETECTED Final   Haemophilus influenzae NOT DETECTED NOT DETECTED Final   Neisseria meningitidis NOT DETECTED NOT DETECTED Final   Pseudomonas aeruginosa NOT DETECTED NOT DETECTED Final   Candida albicans NOT DETECTED NOT DETECTED Final   Candida glabrata NOT DETECTED NOT DETECTED Final   Candida krusei NOT DETECTED NOT DETECTED Final   Candida parapsilosis NOT DETECTED NOT DETECTED Final   Candida tropicalis NOT DETECTED NOT DETECTED  Final    Comment: Performed at [facility], [place] [address], [place], [place] [address]  Urine culture     Status: Abnormal   Collection Time: 07/28/18 12:28 PM  Result Value Ref Range Status   Specimen Description   Final    URINE, RANDOM Performed at [facility] [address] [place], [place] [address]    Special Requests   Final    NONE Performed at [facility], [place] [address] [place], [place] [address]    Culture 50,000 COLONIES/mL CITROBACTER KOSERI (A)  Final   Report Status 07/30/2018 FINAL  Final   Organism ID, Bacteria CITROBACTER KOSERI (A)  Final      Susceptibility   Citrobacter koseri - MIC*    CEFAZOLIN <=4 SENSITIVE Sensitive     CEFTRIAXONE <=1 SENSITIVE Sensitive     CIPROFLOXACIN <=0.25 SENSITIVE Sensitive     GENTAMICIN <=1 SENSITIVE Sensitive     IMIPENEM <=0.25 SENSITIVE Sensitive     NITROFURANTOIN <=16 SENSITIVE Sensitive     TRIMETH/SULFA <=20 SENSITIVE Sensitive     PIP/TAZO 16 SENSITIVE Sensitive     * 50,000 COLONIES/mL CITROBACTER KOSERI  MRSA PCR Screening     Status: None   Collection Time: 07/28/18  2:45 PM  Result Value Ref Range Status   MRSA by PCR NEGATIVE NEGATIVE Final    Comment:        The GeneXpert MRSA Assay (FDA approved for NASAL specimens only), is one component of a comprehensive MRSA colonization surveillance program. It is not intended to diagnose MRSA infection nor to guide or monitor  treatment for MRSA infections. Performed at [facility], [place] [name], [place], [place] [address]          Radiology Studies: No results found.      Scheduled Meds: . cephALEXin  500 mg Oral Q8H  . enoxaparin (LOVENOX) injection  40 mg Subcutaneous Q24H  . finasteride  5 mg Oral Daily  . insulin aspart  0-5 Units Subcutaneous QHS  . insulin aspart  0-9 Units Subcutaneous TID WC  . insulin aspart  5 Units Subcutaneous TID WC  . [START ON 08/01/2018] insulin glargine  30 Units Subcutaneous Daily  . metoprolol succinate  25 mg Oral Daily  . pantoprazole  40 mg Oral Daily  . tamsulosin  0.4 mg Oral Daily   Continuous Infusions:    LOS: 3 days     Vernell Leep, MD, FACP, [facility]. [facility] Pager [phone]  If 7PM-7AM, please contact night-coverage [email or web address] Password [email or web address] 07/31/2018, 5:06 PM

## 2018-07-31 NOTE — Progress Notes (Signed)
Patient accepted to [facility]. SNF started the Insurance Authorization process. CSW will inform the medical team when received.   Kathrin Greathouse, Marlinda Mike, MSW Clinical Social Worker  [phone]

## 2018-08-01 DIAGNOSIS — N183 Chronic kidney disease, stage 3 (moderate): Secondary | ICD-10-CM | POA: Diagnosis not present

## 2018-08-01 DIAGNOSIS — A419 Sepsis, unspecified organism: Secondary | ICD-10-CM | POA: Diagnosis not present

## 2018-08-01 DIAGNOSIS — C9210 Chronic myeloid leukemia, BCR/ABL-positive, not having achieved remission: Secondary | ICD-10-CM | POA: Diagnosis not present

## 2018-08-01 DIAGNOSIS — G9341 Metabolic encephalopathy: Secondary | ICD-10-CM | POA: Diagnosis not present

## 2018-08-01 LAB — CBC
HCT: 30.2 % — ABNORMAL LOW (ref 39.0–52.0)
Hemoglobin: 9.6 g/dL — ABNORMAL LOW (ref 13.0–17.0)
MCH: 28.1 pg (ref 26.0–34.0)
MCHC: 31.8 g/dL (ref 30.0–36.0)
MCV: 88.3 fL (ref 80.0–100.0)
Platelets: 146 10*3/uL — ABNORMAL LOW (ref 150–400)
RBC: 3.42 MIL/uL — ABNORMAL LOW (ref 4.22–5.81)
RDW: 12.9 % (ref 11.5–15.5)
WBC: 5 10*3/uL (ref 4.0–10.5)
nRBC: 0 % (ref 0.0–0.2)

## 2018-08-01 LAB — GLUCOSE, CAPILLARY
GLUCOSE-CAPILLARY: 222 mg/dL — AB (ref 70–99)
Glucose-Capillary: 187 mg/dL — ABNORMAL HIGH (ref 70–99)
Glucose-Capillary: 161 mg/dL — ABNORMAL HIGH (ref 70–99)
Glucose-Capillary: 179 mg/dL — ABNORMAL HIGH (ref 70–99)

## 2018-08-01 MED ORDER — NITROGLYCERIN 0.4 MG SL SUBL: 0.4000 mg | SUBLINGUAL_TABLET | SUBLINGUAL | Status: DC | PRN

## 2018-08-01 MED ORDER — INSULIN GLARGINE (1 UNIT DIAL) 300 UNIT/ML ~~LOC~~ SOPN: 30.0000 [IU] | PEN_INJECTOR | Freq: Every day | SUBCUTANEOUS | Status: DC

## 2018-08-01 MED ORDER — CEPHALEXIN 500 MG PO CAPS: 500.0000 mg | ORAL_CAPSULE | Freq: Three times a day (TID) | ORAL | Status: AC

## 2018-08-01 NOTE — Progress Notes (Signed)
Discharge report called to [facility] at [facility] @ [phone]. Condition stable. Transferred via [facility]. Clothes from home sent with son. Eulas Post, RN

## 2018-08-01 NOTE — Discharge Summary (Signed)
Physician Discharge Summary  Taven Strite KPT:[id number] DOB: 29-Nov-1940  PCP: Patient, No Pcp Per  Admit date: 07/28/2018 Discharge date: 08/01/2018  Recommendations for Outpatient Follow-up:  1. MD at SNF in 3 days with repeat labs (CBC & BMP). 2. Recommend repeating urine microscopy in 2 to 3 weeks to reassess for microscopic hematuria and if persists then needs further evaluation.  Home Health: Patient being discharged to an SNF. Equipment/Devices: TBD at SNF.  Discharge Condition: Improved and stable. CODE STATUS: Full Diet recommendation: Heart healthy & diabetic diet.  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 ([place])   Acute metabolic encephalopathy   Brief Summary: 77 year old male with PMH of poorly controlled DM 1/IDDM, HTN, CML (noncompliant with follow-up and treatment), chronic diastolic CHF, stage III chronic kidney disease with unknown baseline creatinine, HLD, hospitalized at [facility] for nausea, vomiting, diarrhea and abdominal pain secondary to UTI and for hyperglycemia without ketosis at which time urine culture grew Citrobacter and he was treated with IV ceftriaxone and discharged home on 11/7, subsequently seen by his PCP last month, presented to ED with confusion (hence poor history on admission), vomiting, chills, poor oral intake.  Family reported that patient lives alone, ambulates with a Rollator, independent of his ADLs and coherent at baseline.  In ED noted to be septic suspected due to UTI and persistent hypoglycemia.   Assessment & Plan:  1. Sepsis suspected due to UTI: Received empiric vancomycin and cefepime in ED which was then narrowed to IV ceftriaxone.   He was bolused with normal saline in ED.  Sepsis physiology resolved.  Urine culture shows 50,000 colonies of Citrobacter koseri.  Completed 2 days of IV ceftriaxone.  Changed to oral Keflex and complete total 7 days.  BCID/1 of 4 blood culture shows coagulase-negative staph (MRSA PCR negative), which is likely a contaminant.  CT abdomen and pelvis without acute findings.   2. 1 of 4 blood cultures with coagulase-negative staph: Possibly contaminant.  Follow final blood culture results.  3. Acute toxic metabolic encephalopathy: Secondary to UTI sepsis and hypoglycemia.  Resolved.  CT head without acute findings. 4. Type I DM with hypoglycemia without coma: Patient on high-dose insulin's (Toujeo 30 units at bedtime and U- 100, 8 units 3 times a day.  Insulins were obviously held, resuscitated with D50 x2 but was persistently hypoglycemic and was placed on D10 infusion.  Hypoglycemia resolved and persistently hyperglycemic subsequently.  Discontinued D10 infusion.  Started diet. A1c of 13.2 suggests very poor outpatient control.?  Compliance.  Now transitioned to Lantus 30 units DAILY, continue prior home short-acting insulin.    Monitor closely and adjust insulins as needed. 5. Acute kidney injury on stage III chronic kidney disease: Presented with creatinine of 1.38.  Acute kidney injury resolved..  Follow BMP periodically.  Avoid nephrotoxins. 6. CML: Sees hematologist (Dr. Cruzita Lederer) at [facility] and reportedly noncompliant with treatments.  Presented with leukocytosis which has resolved.  Outpatient follow-up. 7. Essential hypertension: Controlled.  Continue Toprol-XL. 8. BPH: Continue Proscar and Flomax. 9. GERD: Continue PPI. 10. Hypokalemia: Replaced.  Magnesium 2.1. 11. Dyslipidemia 12. Elevated TSH: Suspect initial lab results were in error.  Repeat TSH, free T3 and free T4 are within normal limits. 13.  Normocytic anemia: Suspect dilutional from IV fluids.    Hemoglobin stable in the 9 g range for the  last 3 days. 14. Thrombocytopenia: Related to infection versus antibiotics.    Slowly improving.  Follow CBCs as outpatient.   Consultants:  None  Procedures:  None  Discharge Instructions  Discharge Instructions    Call MD for:  difficulty breathing, headache or visual disturbances   Complete by:  As directed    Call MD for:  extreme fatigue   Complete by:  As directed    Call MD for:  persistant dizziness or light-headedness   Complete by:  As directed    Call MD for:  persistant nausea and vomiting   Complete by:  As directed    Call MD for:  severe uncontrolled pain   Complete by:  As directed    Call MD for:  temperature >100.4   Complete by:  As directed    Diet - low sodium heart healthy   Complete by:  As directed    Diet Carb Modified   Complete by:  As directed    Increase activity slowly   Complete by:  As directed        Medication List    TAKE these medications   APIDRA 100 UNIT/ML injection Generic drug:  insulin glulisine Inject 8 Units into the skin 3 (three) times daily before meals.   aspirin EC 81 MG tablet Take 81 mg by mouth every other day.   atorvastatin 80 MG tablet Commonly known as:  LIPITOR Take 80 mg by mouth daily.   azelastine 0.1 % nasal spray Commonly known as:  ASTELIN Place 1 spray into both nostrils 2 (two) times daily as needed for rhinitis or allergies.   cephALEXin 500 MG capsule Commonly known as:  KEFLEX Take 1 capsule (500 mg total) by mouth 3 (three) times daily for 3 days.   ergocalciferol 1.25 MG (50000 UT) capsule Commonly known as:  VITAMIN D2 Take 50,000 Units by mouth every Monday.   finasteride 5 MG tablet Commonly known as:  PROSCAR Take 5 mg by mouth daily.   FLONASE 50 MCG/ACT nasal spray Generic drug:  fluticasone Place 2 sprays into both nostrils daily as needed for allergies.   Insulin Glargine (1 Unit Dial) 300 UNIT/ML Sopn Commonly known as:  TOUJEO SOLOSTAR Inject 30 Units into the skin  daily. Start taking on:  August 02, 2018 What changed:    medication strength  when to take this   metoprolol succinate 25 MG 24 hr tablet Commonly known as:  TOPROL-XL Take 25 mg by mouth daily.   montelukast 10 MG tablet Commonly known as:  SINGULAIR Take 10 mg by mouth at bedtime.   nitroGLYCERIN 0.4 MG SL tablet Commonly known as:  NITROSTAT Place 1 tablet (0.4 mg total) under the tongue every 5 (five) minutes as needed for chest pain. What changed:    when to take this  reasons to take this   omeprazole 40 MG capsule Commonly known as:  PRILOSEC Take 40 mg by mouth daily.   tamsulosin 0.4 MG Caps capsule Commonly known as:  FLOMAX Take 0.4 mg by mouth daily.   TYLENOL 325 MG tablet Generic drug:  acetaminophen Take 325 mg by mouth every 6 (six) hours as needed for mild pain, moderate pain or headache.      Follow-up Information    MD at SNF. Schedule an appointment as soon as possible for a visit in 3 day(s).  Why:  To be seen with repeat labs (CBC & BMP).         Allergies  Allergen Reactions  . Influenza Vaccines Hives  . Aspirin Other (See Comments)    DIZZINESS, UNCONSCIOUSNESS 09/16/15 pt takes 81 mg aspirin at home.[name], RPh  . Cyclobenzaprine Hives  . Dextromethorphan Other (See Comments)    Unknown   . Doxycycline Nausea Only  . Flu Virus Vaccine   . Gabapentin Other (See Comments)    GI UPSET,DROWSINESS  . Glipizide Other (See Comments)    DIZZINESS  . Hydrocodone-Acetaminophen Other (See Comments)    Unknown   . Iodinated Diagnostic Agents Other (See Comments)    Unknown   . Latex Other (See Comments)    Unknown   . Lisinopril Other (See Comments)    COUGH  . Pregabalin Other (See Comments)    GI UPSET,DROWSINESS  . Ropinirole Nausea Only  . Salicylates Other (See Comments)    Unknown   . Sulfamethoxazole-Trimethoprim Nausea Only  . Oxycodone-Acetaminophen Hives and Nausea Only  . Penicillins Hives, Itching and  Rash    Has patient had a PCN reaction causing immediate rash, facial/tongue/throat swelling, SOB or lightheadedness with hypotension: Yes Has patient had a PCN reaction causing severe rash involving mucus membranes or skin necrosis: No Has patient had a PCN reaction that required hospitalization: No Has patient had a PCN reaction occurring within the last 10 years: No If all of the above answers are "NO", then may proceed with Cephalosporin use.       Procedures/Studies: Dg Chest 2 View  Result Date: 07/28/2018 CLINICAL DATA:  Altered mental status with nausea and vomiting. Hypertension. EXAM: CHEST - 2 VIEW COMPARISON:  June 20, 2018 FINDINGS: No edema or consolidation. Heart is upper normal in size with pulmonary vascularity normal. No adenopathy. No bone lesions. IMPRESSION: No edema or consolidation.  Heart upper normal in size. Electronically Signed   By: Lowella Grip III M.D.   On: 07/28/2018 09:43   Ct Head Wo Contrast  Result Date: 07/28/2018 CLINICAL DATA:  Altered mental status with nausea and vomiting. EXAM: CT HEAD WITHOUT CONTRAST TECHNIQUE: Contiguous axial images were obtained from the base of the skull through the vertex without intravenous contrast. COMPARISON:  September 26, 2016 FINDINGS: Brain: There is age related volume loss, stable. There is no intracranial mass, hemorrhage, extra-axial fluid collection, or midline shift. There is slight small vessel disease in the centra semiovale bilaterally. No acute appearing infarct is demonstrable on this study. Vascular: There is no appreciable hyperdense vessel. There is calcification in each distal vertebral artery as well as in each carotid siphon region. Skull: The bony calvarium appears intact. Sinuses/Orbits: There is mucosal thickening in several ethmoid air cells bilaterally. Other visualized paranasal sinuses are clear. There is evidence of prior scleral banding on the right. Orbits appear symmetric elsewhere  bilaterally. Other: Mastoid air cells are clear. IMPRESSION: Age related volume loss with mild periventricular small vessel disease. No acute infarct evident. No mass or hemorrhage. Foci of arterial vascular calcification noted. Mucosal thickening noted in several ethmoid air cells. Scleral banding right globe. Electronically Signed   By: Lowella Grip III M.D.   On: 07/28/2018 09:36   Ct Renal Stone Study  Result Date: 07/28/2018 CLINICAL DATA:  Renal failure.  Vomiting.  Lactic acidosis. EXAM: CT ABDOMEN AND PELVIS WITHOUT CONTRAST TECHNIQUE: Multidetector CT imaging of the abdomen and pelvis was performed following the standard protocol without IV contrast. COMPARISON:  02/11/2018  FINDINGS: Lower chest: Moderate to diffuse coronary artery calcifications. Heart is normal size. Hepatobiliary: No focal hepatic abnormality.  Prior cholecystectomy Pancreas: No focal abnormality or ductal dilatation. Spleen: No focal abnormality.  Normal size. Adrenals/Urinary Tract: No adrenal abnormality. No focal renal abnormality. No stones or hydronephrosis. Bladder wall diverticula noted both right lateral and left lateral. The largest is on the right measuring up to 5.6 cm. Stomach/Bowel: Stomach, large and small bowel grossly unremarkable. Vascular/Lymphatic: Aortic atherosclerosis. No enlarged abdominal or pelvic lymph nodes. Reproductive: No visible focal abnormality. Other: No free fluid or free air. Musculoskeletal: No acute bony abnormality. IMPRESSION: No renal or ureteral stones. No hydronephrosis. Two bladder wall diverticula, the largest on the right measuring up to 5.6 cm. Prior cholecystectomy. Aortic atherosclerosis. No acute findings in the abdomen or pelvis. Electronically Signed   By: Rolm Baptise M.D.   On: 07/28/2018 11:50      Subjective: Mild headache this morning.  Improved after Tylenol.  Had a large BM.  No abdominal pain, nausea or vomiting.  Tolerating diet.  No dysuria, urinary frequency,  fever or chills.  Discharge Exam:  Vitals:   07/31/18 1431 07/31/18 2053 08/01/18 0553 08/01/18 1255  BP: 105/68 (!) 99/48 123/60 (!) 101/56  Pulse:  72 70 79  Resp:  16 16 16   Temp:  98 F (36.7 C) 98.2 F (36.8 C) 98.3 F (36.8 C)  TempSrc:  Oral Oral Oral  SpO2:  97% 99% 100%  Weight:      Height:        General exam: Pleasant elderly male, moderately built and nourished lying comfortably supine in bed.  Oral mucosa moist. Respiratory system: Clear to auscultation. Respiratory effort normal. Cardiovascular system: S1 & S2 heard, RRR. No JVD, murmurs, rubs, gallops or clicks. No pedal edema.   Gastrointestinal system: Abdomen is nondistended, soft and nontender. No organomegaly or masses felt. Normal bowel sounds heard.   Central nervous system: Alert and oriented x2. No focal neurological deficits. Extremities: Symmetric 5 x 5 power. Skin: No rashes, lesions or ulcers Psychiatry: Judgement and insight appear impaired. Mood & affect appropriate.     The results of significant diagnostics from this hospitalization (including imaging, microbiology, ancillary and laboratory) are listed below for reference.     Microbiology: Recent Results (from the past 240 hour(s))  Blood culture (routine x 2)     Status: None (Preliminary result)   Collection Time: 07/28/18  9:07 AM  Result Value Ref Range Status   Specimen Description   Final    BLOOD RIGHT ANTECUBITAL Performed at [facility] [address] [place], [place] [address]    Special Requests   Final    BOTTLES DRAWN AEROBIC AND ANAEROBIC Blood Culture adequate volume Performed at [facility] [address], [place], [place] [address]    Culture   Final    NO GROWTH 4 DAYS Performed at [facility], [place] [address], [place], [place] [address]    Report Status PENDING  Incomplete  Blood culture (routine x 2)     Status: Abnormal   Collection Time: 07/28/18 10:23 AM   Result Value Ref Range Status   Specimen Description   Final    BLOOD LEFT HAND Performed at [facility] [address] [place], [place] [address]    Special Requests   Final    BOTTLES DRAWN AEROBIC AND ANAEROBIC Blood Culture results may not be optimal due to an inadequate  volume of blood received in culture bottles Performed at [facility] [address] [place], [place] [address]    Culture  Setup Time   Final    IN BOTH AEROBIC AND ANAEROBIC BOTTLES GRAM POSITIVE COCCI CRITICAL RESULT CALLED TO, READ BACK BY AND VERIFIED WITH: M SWIN PHARMD [id number] BY GF    Culture (A)  Final    STAPHYLOCOCCUS SPECIES (COAGULASE NEGATIVE) THE SIGNIFICANCE OF ISOLATING THIS ORGANISM FROM A SINGLE SET OF BLOOD CULTURES WHEN MULTIPLE SETS ARE DRAWN IS UNCERTAIN. PLEASE NOTIFY THE MICROBIOLOGY DEPARTMENT WITHIN ONE WEEK IF SPECIATION AND SENSITIVITIES ARE REQUIRED. Performed at [facility], [place] [address] [place], [place] [address]    Report Status 07/31/2018 FINAL  Final  Blood Culture ID Panel (Reflexed)     Status: Abnormal   Collection Time: 07/28/18 10:23 AM  Result Value Ref Range Status   Enterococcus species NOT DETECTED NOT DETECTED Final   Listeria monocytogenes NOT DETECTED NOT DETECTED Final   Staphylococcus species DETECTED (A) NOT DETECTED Final    Comment: Methicillin (oxacillin) resistant coagulase negative staphylococcus. Possible blood culture contaminant (unless isolated from more than one blood culture draw or clinical case suggests pathogenicity). No antibiotic treatment is indicated for blood  culture contaminants. CRITICAL RESULT CALLED TO, READ BACK BY AND VERIFIED WITH: M SWIN PHARMD [id number] BY GF    Staphylococcus aureus (BCID) NOT DETECTED NOT DETECTED Final   Methicillin resistance DETECTED (A) NOT DETECTED Final    Comment: CRITICAL RESULT CALLED TO, READ BACK BY AND VERIFIED WITH: M SWIN PHARMD [phone]  BY GF    Streptococcus species NOT DETECTED NOT DETECTED Final   Streptococcus agalactiae NOT DETECTED NOT DETECTED Final   Streptococcus pneumoniae NOT DETECTED NOT DETECTED Final   Streptococcus pyogenes NOT DETECTED NOT DETECTED Final   Acinetobacter baumannii NOT DETECTED NOT DETECTED Final   Enterobacteriaceae species NOT DETECTED NOT DETECTED Final   Enterobacter cloacae complex NOT DETECTED NOT DETECTED Final   Escherichia coli NOT DETECTED NOT DETECTED Final   Klebsiella oxytoca NOT DETECTED NOT DETECTED Final   Klebsiella pneumoniae NOT DETECTED NOT DETECTED Final   Proteus species NOT DETECTED NOT DETECTED Final   Serratia marcescens NOT DETECTED NOT DETECTED Final   Haemophilus influenzae NOT DETECTED NOT DETECTED Final   Neisseria meningitidis NOT DETECTED NOT DETECTED Final   Pseudomonas aeruginosa NOT DETECTED NOT DETECTED Final   Candida albicans NOT DETECTED NOT DETECTED Final   Candida glabrata NOT DETECTED NOT DETECTED Final   Candida krusei NOT DETECTED NOT DETECTED Final   Candida parapsilosis NOT DETECTED NOT DETECTED Final   Candida tropicalis NOT DETECTED NOT DETECTED Final    Comment: Performed at [facility], [address], [place], [place] [address]  Urine culture     Status: Abnormal   Collection Time: 07/28/18 12:28 PM  Result Value Ref Range Status   Specimen Description   Final    URINE, RANDOM Performed at [facility] [address], [place], [place] [address]    Special Requests   Final    NONE Performed at [facility], [place] [address] [place], [place] [address]    Culture 50,000 COLONIES/mL CITROBACTER KOSERI (A)  Final   Report Status 07/30/2018 FINAL  Final   Organism ID, Bacteria CITROBACTER KOSERI (A)  Final      Susceptibility   Citrobacter koseri - MIC*    CEFAZOLIN <=4 SENSITIVE Sensitive  CEFTRIAXONE <=1 SENSITIVE Sensitive     CIPROFLOXACIN <=0.25 SENSITIVE Sensitive     GENTAMICIN  <=1 SENSITIVE Sensitive     IMIPENEM <=0.25 SENSITIVE Sensitive     NITROFURANTOIN <=16 SENSITIVE Sensitive     TRIMETH/SULFA <=20 SENSITIVE Sensitive     PIP/TAZO 16 SENSITIVE Sensitive     * 50,000 COLONIES/mL CITROBACTER KOSERI  MRSA PCR Screening     Status: None   Collection Time: 07/28/18  2:45 PM  Result Value Ref Range Status   MRSA by PCR NEGATIVE NEGATIVE Final    Comment:        The GeneXpert MRSA Assay (FDA approved for NASAL specimens only), is one component of a comprehensive MRSA colonization surveillance program. It is not intended to diagnose MRSA infection nor to guide or monitor treatment for MRSA infections. Performed at [facility], [place] [address], [place], [place] [address]      Labs: CBC: Recent Labs  Lab 07/28/18 [phone]  07/28/18 1430 07/29/18 0614 07/30/18 0620 07/31/18 0527 08/01/18 0623  WBC 13.7*  --  17.5* 8.7 5.7 5.5 5.0  NEUTROABS 9.0*  --   --   --   --   --   --   HGB 12.6*   < > 12.1* 10.7* 9.9* 9.3* 9.6*  HCT 37.3*   < > 35.4* 32.3* 30.4* 28.7* 30.2*  MCV 83.6  --  83.5 85.4 88.1 89.4 88.3  PLT 275  --  203 173 146* 139* 146*   < > = values in this interval not displayed.   Basic Metabolic Panel: Recent Labs  Lab 07/28/18 0907 07/28/18 0911 07/28/18 1430 07/29/18 0614 07/30/18 0620  NA 137 138  --  132* 135  K 3.1* 3.1*  --  4.3 4.4  CL 97* 99  --  98 104  CO2 26  --   --  26 24  GLUCOSE 49* 44*  --  312* 334*  BUN 29* 31*  --  24* 26*  CREATININE 1.38* 1.40* 1.18 1.14 1.02  CALCIUM 9.4  --   --  8.4* 8.2*  MG  --   --  2.1  --   --    Liver Function Tests: Recent Labs  Lab 07/28/18 0907  AST 32  ALT 22  ALKPHOS 101  BILITOT 0.9  PROT 8.2*  ALBUMIN 4.4   CBG: Recent Labs  Lab 07/31/18 1657 07/31/18 2056 08/01/18 0754 08/01/18 1004 08/01/18 1255  GLUCAP 162* 126* 187* 222* 161*   Urinalysis    Component Value Date/Time   COLORURINE YELLOW 07/28/2018 1228   APPEARANCEUR CLOUDY (A)  07/28/2018 1228   LABSPEC 1.020 07/28/2018 1228   PHURINE 5.0 07/28/2018 1228   GLUCOSEU >=500 (A) 07/28/2018 1228   HGBUR MODERATE (A) 07/28/2018 1228   BILIRUBINUR NEGATIVE 07/28/2018 1228   KETONESUR NEGATIVE 07/28/2018 1228   PROTEINUR 100 (A) 07/28/2018 1228   NITRITE POSITIVE (A) 07/28/2018 1228   LEUKOCYTESUR LARGE (A) 07/28/2018 1228    I called and discussed with patient's daughter-in-law via phone.  I was unable to reach patient's daughter.  Time coordinating discharge: 40 minutes  SIGNED:  Vernell Leep, MD, FACP, [facility]. [facility] Pager [phone]  If 7PM-7AM, please contact night-coverage [email or web address] Password [email or web address] 08/01/2018, 3:39 PM

## 2018-08-01 NOTE — Progress Notes (Signed)
Physical Therapy Treatment Patient Details Name: Jerry [name] MRN: [id number] DOB: Jan 10, 1941 Today's Date: 08/01/2018    History of Present Illness 77 yo male with signficant confusion and difficulty with safety awareness was admitted with AMS from sepsis with UTI, encephalopathy, MRSA screen on urine culture and renal failure with lactic acidosis.  He is referred to PT for unsafe gait, but has medical non compliance for several serious conditions.  PMHx:  B foot drop, DM, CHF, CML, CKD 3, atherosclerosis.    PT Comments    Pt with improved ambulation distance this session. Pt with significant gait deviations, including scissoring, steppage gait suspect due to foot sensory changes in association with DM, and unsteadiness corrected by both pt and PT. Pt unsafe to d/c home at this time, continuing to recommend ST-SNF placement for pt safety. PT to continue to follow acutely.    Follow Up Recommendations  SNF     Equipment Recommendations  None recommended by PT    Recommendations for Other Services       Precautions / Restrictions Precautions Precautions: Fall Restrictions Weight Bearing Restrictions: No    Mobility  Bed Mobility Overal bed mobility: Needs Assistance Bed Mobility: Supine to Sit;Sit to Supine     Supine to sit: Min guard;HOB elevated Sit to supine: Min guard;HOB elevated   General bed mobility comments: Min guard assist for safety. Pt with increased time and effort to get to EOB.   Transfers Overall transfer level: Needs assistance Equipment used: Rolling walker (2 wheeled) Transfers: Sit to/from Stand Sit to Stand: Min assist         General transfer comment: Min assist for power up and steadying. VC for hand placement on RW and bed.   Ambulation/Gait Ambulation/Gait assistance: Min assist;Min guard Gait Distance (Feet): 150 Feet Assistive device: Rolling walker (2 wheeled) Gait Pattern/deviations: Step-through pattern;Decreased stride  length;Steppage;Trunk flexed;Scissoring;Drifts right/left Gait velocity: decr    General Gait Details: Min guard to min assist for safety, pt needing occasional steadying. Pt with scissoring x3 with imbalance, pt- and PT-corrected. Pt with decreased DF when stepping, more pronounced with more steppage on LLE. Verbal cuing for placement in RW, upright posture as pt tends to look downward, and careful foot placement. Pt endorses decreased sensation of bialteral feet, suspect gait changes due to this.   Stairs             Wheelchair Mobility    Modified Rankin (Stroke Patients Only)       Balance Overall balance assessment: History of Falls;Needs assistance Sitting-balance support: No upper extremity supported Sitting balance-Leahy Scale: Fair     Standing balance support: Bilateral upper extremity supported Standing balance-Leahy Scale: Poor Standing balance comment: relies on PT and RW for steadying.                             Cognition Arousal/Alertness: Awake/alert Behavior During Therapy: Impulsive Overall Cognitive Status: Within Functional Limits for tasks assessed                                 General Comments: Pt attempts to move quickly, unsafe with transfers. PT reinforced safety during session.       Exercises General Exercises - Lower Extremity Long Arc Quad: AROM;Both;10 reps;Seated Hip Flexion/Marching: AROM;Both;10 reps;Seated Mini-Sqauts: AROM;Both;10 reps;Seated;Standing(Sit to stands on elevated bed height)    General Comments  Pertinent Vitals/Pain Pain Assessment: No/denies pain    Home Living                      Prior Function            PT Goals (current goals can now be found in the care plan section) Acute Rehab PT Goals Patient Stated Goal: to get to walk more PT Goal Formulation: With patient Time For Goal Achievement: 08/12/18 Potential to Achieve Goals: Fair Progress towards PT  goals: Progressing toward goals    Frequency    Min 2X/week      PT Plan Current plan remains appropriate    Co-evaluation              AM-PAC PT "6 Clicks" Mobility   Outcome Measure  Help needed turning from your back to your side while in a flat bed without using bedrails?: A Little Help needed moving from lying on your back to sitting on the side of a flat bed without using bedrails?: A Lot Help needed moving to and from a bed to a chair (including a wheelchair)?: A Little Help needed standing up from a chair using your arms (e.g., wheelchair or bedside chair)?: A Little Help needed to walk in hospital room?: A Little Help needed climbing 3-5 steps with a railing? : A Lot 6 Click Score: 16    End of Session Equipment Utilized During Treatment: Gait belt Activity Tolerance: Patient limited by fatigue Patient left: with call bell/phone within reach;in bed;with bed alarm set Nurse Communication: Mobility status PT Visit Diagnosis: Unsteadiness on feet (R26.81);History of falling (Z91.81);Muscle weakness (generalized) (M62.81)     Time: 2836-6294 PT Time Calculation (min) (ACUTE ONLY): 24 min  Charges:  $Gait Training: 8-22 mins $Therapeutic Exercise: 8-22 mins                    Jerry [name], PT Acute Rehabilitation Services Pager [phone]  Office [phone]    Jerry [name] 08/01/2018, 1:39 PM

## 2018-08-01 NOTE — Progress Notes (Signed)
Clinical Social Worker facilitated patient discharge including contacting patient family and facility to confirm patient discharge plans.  Clinical information faxed to facility and family agreeable with plan.  CSW arranged ambulance transport via [facility] to [facility] .  RN to call [phone] (pt going to rm# [phone]) for  report prior to discharge.  Clinical Social Worker will sign off for now as social work intervention is no longer needed. Please consult [place] again if new need arises.  Rhea Pink, MSW, [place]

## 2018-08-01 NOTE — Discharge Instructions (Signed)

## 2018-08-01 NOTE — Care Management CC44 (Signed)
Condition Code 44 Documentation Completed  Patient Details  Name: Jerry [name] MRN: [id number] Date of Birth: 1941/04/28   Condition Code 44 given:  Yes Patient signature on Condition Code 44 notice:    Documentation of 2 MD's agreement:    Code 44 added to claim:  Yes    Dessa Phi, RN 08/01/2018, 1:23 PM

## 2018-08-01 NOTE — Care Management Important Message (Signed)
Important Message  Patient Details  Name: Jerry [name] MRN: [id number] Date of Birth: Dec 26, 1940   Medicare Important Message Given:  Yes    Kerin Salen 08/01/2018, 12:08 [place] Message  Patient Details  Name: Jerry [name] MRN: [id number] Date of Birth: [birth year]   Medicare Important Message Given:  Yes    Kerin Salen 08/01/2018, 12:08 PM

## 2018-08-01 NOTE — Care Management Obs Status (Signed)
[place] NOTIFICATION   Patient Details  Name: Jerry [name] MRN: [id number] Date of Birth: 1940-10-20   Medicare Observation Status Notification Given:  Yes    Dessa Phi, RN 08/01/2018, 1:21 PM

## 2018-08-02 ENCOUNTER — Encounter: Payer: Self-pay | Admitting: Adult Health

## 2018-08-02 ENCOUNTER — Non-Acute Institutional Stay (SKILLED_NURSING_FACILITY): Payer: Medicare Other | Admitting: Adult Health

## 2018-08-02 DIAGNOSIS — N400 Enlarged prostate without lower urinary tract symptoms: Secondary | ICD-10-CM

## 2018-08-02 DIAGNOSIS — E785 Hyperlipidemia, unspecified: Secondary | ICD-10-CM

## 2018-08-02 DIAGNOSIS — A419 Sepsis, unspecified organism: Secondary | ICD-10-CM

## 2018-08-02 DIAGNOSIS — N179 Acute kidney failure, unspecified: Secondary | ICD-10-CM | POA: Diagnosis not present

## 2018-08-02 DIAGNOSIS — N390 Urinary tract infection, site not specified: Secondary | ICD-10-CM

## 2018-08-02 DIAGNOSIS — K219 Gastro-esophageal reflux disease without esophagitis: Secondary | ICD-10-CM

## 2018-08-02 DIAGNOSIS — N183 Chronic kidney disease, stage 3 (moderate): Secondary | ICD-10-CM

## 2018-08-02 DIAGNOSIS — Z794 Long term (current) use of insulin: Secondary | ICD-10-CM

## 2018-08-02 DIAGNOSIS — E11649 Type 2 diabetes mellitus with hypoglycemia without coma: Secondary | ICD-10-CM | POA: Diagnosis not present

## 2018-08-02 DIAGNOSIS — D638 Anemia in other chronic diseases classified elsewhere: Secondary | ICD-10-CM

## 2018-08-02 DIAGNOSIS — I10 Essential (primary) hypertension: Secondary | ICD-10-CM

## 2018-08-02 DIAGNOSIS — C9210 Chronic myeloid leukemia, BCR/ABL-positive, not having achieved remission: Secondary | ICD-10-CM

## 2018-08-02 LAB — CULTURE, BLOOD (ROUTINE X 2)
Culture: NO GROWTH
Special Requests: ADEQUATE

## 2018-08-02 NOTE — Progress Notes (Signed)
Location:  [facility] Room Number: 120-A Place of Service:  SNF (31) Provider:  Durenda Age, NP  Patient Care Team: Patient, No Pcp Per as PCP - General (General Practice)  Extended Emergency Contact Information Primary Emergency Contact: Omura,Helen Address: [address]          [place] [address] [place] Phone: [phone] Relation: Other Secondary Emergency Contact: Freel,[name] Address: [place], [place] [address] [place] Phone: [phone] Mobile Phone: [phone] Relation: Son  Code Status:  Full Code  Goals of care: Advanced Directive information Advanced Directives 07/28/2018  Does Patient Have a Medical Advance Directive? No  Would patient like information on creating a medical advance directive? No - Patient declined     Chief Complaint  Patient presents with  . Acute Visit    Hospital followup    HPI:  Pt is a 77 y.o. male seen today for hospital followup.  He was admitted to [facility] for short-term rehabilitation on 08/01/18 following an admission at [facility] 12/13-12/17 for sepsis secondary to UTI.  He has a PMH of CKD stage III, chronic myeloid leukemia, essential hypertension, type 2 DM, and HLD. He was recently hospitalized at [facility] for UTI with hyperglycemia without ketosis. Urine culture grew Citrobacter and was treated with IV Ceftriaxone and was discharged home on 11/7. He went back to ED with confusion, vomiting, chills and poor oral intake. CT head without acute findings. He was diagnosed to be septic due to UTI and persistent hypoglycemia. He was empirically treated with Vancomycin and Cefepime then shifted to IV Ceftriaxone. He was given normal saline bolus. Urine culture showed 50,000 colonies of Citrobacter koseri. He completed 2 days of IV Ceftriaxone then changed to oral Keflex to complete a total of 7 days. Blood  culture (1/4) showed coagulase negative staph (MRSA negative) which was thought to be likely a contaminant. His Toujeo 30 units Q HS and U-100  8 units 3 times a day were held due to hypoglycemia. He was given D50 X 2 but continued to be hypoglycemic and was placed on D10 infusion but subsequently became hyperglycemic so D10 was discontinued. A1c was 13.2 suggesting poor outpatient glucose control/compliance. He was transitioned to Lantus 30 units daily and home dose of Apidra 8 units TID before meals  He was seen in the room today. He reports having slight dysuria whenever he starts urination.   Past Medical History:  Diagnosis Date  . Diabetes mellitus without complication ([place])   . Hyperlipidemia   . Hypertension   . Leukemia ([place])   . MRSA infection 09/19/2015   left arm  . Urinary retention 01/11/2014   Past Surgical History:  Procedure Laterality Date  . APPENDECTOMY    . BACK SURGERY    . CHOLECYSTECTOMY    . EYE SURGERY    . INCISE AND DRAIN ABCESS Left 09/16/2015   forearm; debscess undere MAC; [facility]; Dr Curley Spice  . INCISION AND DRAINAGE ABSCESS Left 09/19/15   forearm wound washout with pulavac at [facility] by Dr Curley Spice    Allergies  Allergen Reactions  . Influenza Vaccines Hives  . Aspirin Other (See Comments)    DIZZINESS, UNCONSCIOUSNESS 09/16/15 pt takes 81 mg aspirin at home.[name], RPh  . Cyclobenzaprine Hives  . Dextromethorphan Other (See Comments)    Unknown   . Doxycycline Nausea Only  . Flu  Virus Vaccine   . Gabapentin Other (See Comments)    GI UPSET,DROWSINESS  . Glipizide Other (See Comments)    DIZZINESS  . Hydrocodone-Acetaminophen Other (See Comments)    Unknown   . Iodinated Diagnostic Agents Other (See Comments)    Unknown   . Latex Other (See Comments)    Unknown   . Lisinopril Other (See Comments)    COUGH  . Pregabalin Other (See Comments)    GI UPSET,DROWSINESS  . Ropinirole Nausea Only  . Salicylates Other (See  Comments)    Unknown   . Sulfamethoxazole-Trimethoprim Nausea Only  . Oxycodone-Acetaminophen Hives and Nausea Only  . Penicillins Hives, Itching and Rash    Has patient had a PCN reaction causing immediate rash, facial/tongue/throat swelling, SOB or lightheadedness with hypotension: Yes Has patient had a PCN reaction causing severe rash involving mucus membranes or skin necrosis: No Has patient had a PCN reaction that required hospitalization: No Has patient had a PCN reaction occurring within the last 10 years: No If all of the above answers are "NO", then may proceed with Cephalosporin use.     Outpatient Encounter Medications as of 08/02/2018  Medication Sig  . acetaminophen (TYLENOL) 325 MG tablet Take 325 mg by mouth every 6 (six) hours as needed for mild pain, moderate pain or headache.   Marland Kitchen aspirin EC 81 MG tablet Take 81 mg by mouth every other day.   Marland Kitchen atorvastatin (LIPITOR) 80 MG tablet Take 80 mg by mouth daily.  Marland Kitchen azelastine (ASTELIN) 0.1 % nasal spray Place 1 spray into both nostrils 2 (two) times daily as needed for rhinitis or allergies.  . cephALEXin (KEFLEX) 500 MG capsule Take 1 capsule (500 mg total) by mouth 3 (three) times daily for 3 days.  . ergocalciferol (VITAMIN D2) 1.25 MG (50000 UT) capsule Take 50,000 Units by mouth every Monday.  . finasteride (PROSCAR) 5 MG tablet Take 5 mg by mouth daily.   . fluticasone (FLONASE) 50 MCG/ACT nasal spray Place 2 sprays into both nostrils daily as needed for allergies.   . Insulin Glargine, 1 Unit Dial, (TOUJEO SOLOSTAR) 300 UNIT/ML SOPN Inject 30 Units into the skin daily.  . insulin glulisine (APIDRA) 100 UNIT/ML injection Inject 8 Units into the skin 3 (three) times daily before meals.  . metoprolol succinate (TOPROL-XL) 25 MG 24 hr tablet Take 25 mg by mouth daily.   . montelukast (SINGULAIR) 10 MG tablet Take 10 mg by mouth at bedtime.  . nitroGLYCERIN (NITROSTAT) 0.4 MG SL tablet Place 1 tablet (0.4 mg total) under the  tongue every 5 (five) minutes as needed for chest pain.  Marland Kitchen omeprazole (PRILOSEC) 40 MG capsule Take 40 mg by mouth daily.   . tamsulosin (FLOMAX) 0.4 MG CAPS capsule Take 0.4 mg by mouth daily.   . [DISCONTINUED] atorvastatin (LIPITOR) 80 MG tablet Take 80 mg by mouth daily.   . [DISCONTINUED] montelukast (SINGULAIR) 10 MG tablet Take 10 mg by mouth at bedtime.    No facility-administered encounter medications on file as of 08/02/2018.     Review of Systems  GENERAL: No change in appetite, no fatigue, no weight changes, no fever, chills or weakness MOUTH and THROAT: Denies oral discomfort, gingival pain or bleeding, pain from teeth or hoarseness   RESPIRATORY: no cough, SOB, DOE, wheezing, hemoptysis CARDIAC: No chest pain, edema or palpitations GI: +dysuria GU: Denies dysuria, frequency, hematuria, incontinence, or discharge PSYCHIATRIC: Denies feelings of depression or anxiety. No report of hallucinations, insomnia, paranoia, or  agitation    Immunization History  Administered Date(s) Administered  . Influenza, Seasonal, Injecte, Preservative Fre 08/21/2012  . Influenza-Unspecified 08/21/2012  . Pneumococcal Conjugate-13 02/28/2015, 11/25/2015  . Pneumococcal Polysaccharide-23 08/26/2016   Pertinent  Health Maintenance Due  Topic Date Due  . FOOT EXAM  08/14/1953  . OPHTHALMOLOGY EXAM  08/14/1953  . URINE MICROALBUMIN  08/14/1953  . COLONOSCOPY  08/14/1993  . PNA vac Low Risk Adult (1 of 2 - PCV13) 08/14/2008  . INFLUENZA VACCINE  03/16/2018  . HEMOGLOBIN A1C  01/27/2019      Vitals:   08/02/18 0912  BP: 132/64  Pulse: 64  Resp: 18  Temp: (!) 97.3 F (36.3 C)  TempSrc: Oral  Weight: 154 lb 5.1 oz (70 kg)  Height: _0  (1.854 m)   Body mass index is 20.36 kg/m.  Physical Exam  GENERAL APPEARANCE:  In no acute distress. Normal body habitus SKIN:  Skin is warm and dry.  MOUTH and THROAT: Lips are without lesions. Oral mucosa is moist and without lesions.    RESPIRATORY: Breathing is even & unlabored, BS CTAB CARDIAC: RRR, no murmur,no extra heart sounds, no edema GI: Abdomen soft, normal BS, no masses, no tenderness EXTREMITIES:  Able to move X 4 extremities NEUROLOGICAL: There is no tremor. Speech is clear. Alert and oriented X 3.  PSYCHIATRIC: Affect and behavior are appropriate   Labs reviewed: Recent Labs    07/28/18 0907 07/28/18 0911 07/28/18 1430 07/29/18 0614 07/30/18 0620  NA 137 138  --  132* 135  K 3.1* 3.1*  --  4.3 4.4  CL 97* 99  --  98 104  CO2 26  --   --  26 24  GLUCOSE 49* 44*  --  312* 334*  BUN 29* 31*  --  24* 26*  CREATININE 1.38* 1.40* 1.18 1.14 1.02  CALCIUM 9.4  --   --  8.4* 8.2*  MG  --   --  2.1  --   --    Recent Labs    07/28/18 0907  AST 32  ALT 22  ALKPHOS 101  BILITOT 0.9  PROT 8.2*  ALBUMIN 4.4   Recent Labs    07/28/18 0907  07/30/18 0620 07/31/18 0527 08/01/18 0623  WBC 13.7*   < > 5.7 5.5 5.0  NEUTROABS 9.0*  --   --   --   --   HGB 12.6*   < > 9.9* 9.3* 9.6*  HCT 37.3*   < > 30.4* 28.7* 30.2*  MCV 83.6   < > 88.1 89.4 88.3  PLT 275   < > 146* 139* 146*   < > = values in this interval not displayed.   Lab Results  Component Value Date   TSH 2.322 07/28/2018   Lab Results  Component Value Date   HGBA1C 13.2 (H) 07/28/2018    Significant Diagnostic Results in last 30 days:  Dg Chest 2 View  Result Date: 07/28/2018 CLINICAL DATA:  Altered mental status with nausea and vomiting. Hypertension. EXAM: CHEST - 2 VIEW COMPARISON:  June 20, 2018 FINDINGS: No edema or consolidation. Heart is upper normal in size with pulmonary vascularity normal. No adenopathy. No bone lesions. IMPRESSION: No edema or consolidation.  Heart upper normal in size. Electronically Signed   By: Lowella Grip III M.D.   On: 07/28/2018 09:43   Ct Head Wo Contrast  Result Date: 07/28/2018 CLINICAL DATA:  Altered mental status with nausea and vomiting. EXAM: CT HEAD WITHOUT  CONTRAST TECHNIQUE:  Contiguous axial images were obtained from the base of the skull through the vertex without intravenous contrast. COMPARISON:  September 26, 2016 FINDINGS: Brain: There is age related volume loss, stable. There is no intracranial mass, hemorrhage, extra-axial fluid collection, or midline shift. There is slight small vessel disease in the centra semiovale bilaterally. No acute appearing infarct is demonstrable on this study. Vascular: There is no appreciable hyperdense vessel. There is calcification in each distal vertebral artery as well as in each carotid siphon region. Skull: The bony calvarium appears intact. Sinuses/Orbits: There is mucosal thickening in several ethmoid air cells bilaterally. Other visualized paranasal sinuses are clear. There is evidence of prior scleral banding on the right. Orbits appear symmetric elsewhere bilaterally. Other: Mastoid air cells are clear. IMPRESSION: Age related volume loss with mild periventricular small vessel disease. No acute infarct evident. No mass or hemorrhage. Foci of arterial vascular calcification noted. Mucosal thickening noted in several ethmoid air cells. Scleral banding right globe. Electronically Signed   By: Lowella Grip III M.D.   On: 07/28/2018 09:36   Ct Renal Stone Study  Result Date: 07/28/2018 CLINICAL DATA:  Renal failure.  Vomiting.  Lactic acidosis. EXAM: CT ABDOMEN AND PELVIS WITHOUT CONTRAST TECHNIQUE: Multidetector CT imaging of the abdomen and pelvis was performed following the standard protocol without IV contrast. COMPARISON:  02/11/2018 FINDINGS: Lower chest: Moderate to diffuse coronary artery calcifications. Heart is normal size. Hepatobiliary: No focal hepatic abnormality.  Prior cholecystectomy Pancreas: No focal abnormality or ductal dilatation. Spleen: No focal abnormality.  Normal size. Adrenals/Urinary Tract: No adrenal abnormality. No focal renal abnormality. No stones or hydronephrosis. Bladder wall diverticula noted both  right lateral and left lateral. The largest is on the right measuring up to 5.6 cm. Stomach/Bowel: Stomach, large and small bowel grossly unremarkable. Vascular/Lymphatic: Aortic atherosclerosis. No enlarged abdominal or pelvic lymph nodes. Reproductive: No visible focal abnormality. Other: No free fluid or free air. Musculoskeletal: No acute bony abnormality. IMPRESSION: No renal or ureteral stones. No hydronephrosis. Two bladder wall diverticula, the largest on the right measuring up to 5.6 cm. Prior cholecystectomy. Aortic atherosclerosis. No acute findings in the abdomen or pelvis. Electronically Signed   By: Rolm Baptise M.D.   On: 07/28/2018 11:50    Assessment/Plan  1. Sepsis secondary to UTI [facility]) - was empirically treated with Vancomycin and Cefepime then shifted to IV Ceftriaxone. Urine culture showed 50,000 colonies of Citrobacter koseri. He completed 2 days of IV Ceftriaxone then changed to oral Keflex to complete a total of 7 days.  2. Type 2 diabetes mellitus with hypoglycemia without coma, with long-term current use of insulin (HCC) - His Toujeo 30 units Q HS and U-100  8 units 3 times a day were held due to hypoglycemia. He was given D50 X 2 but continued to be hypoglycemic and was placed on D10 infusion but subsequently became hyperglycemic so D10 was discontinued. A1c was 13.2 suggesting poor outpatient glucose control/compliance. He was transitioned to Lantus 30 units daily and home dose of Apidra 8 units TID before meals    3. Hyperlipidemia LDL goal <100 -continue Lipitor 80 mg 1 tab daily   4. Acute renal failure superimposed on stage 3 chronic kidney disease, unspecified acute renal failure type [facility]) - will re-check Lab Results  Component Value Date   CREATININE 1.02 07/30/2018     5. Anemia in chronic illness - stable, will monitor Lab Results  Component Value Date   HGB 9.6 (L) 08/01/2018  6. Chronic myeloid leukemia ([place]) - follows up with Hematologist, Dr.  Cruzita Lederer, at [facility] , reported to be noncompliant with treatments   7. Gastroesophageal reflux disease without esophagitis -continue omeprazole 40 mg 1 capsule daily   8.  Benign fibroma of prostate -continue Proscar 5 mg 1 tab daily and Flomax 0.4 mg 1 capsule daily  9. Essential hypertension -continue Toprol-XL 25 mg 1 tab daily    Family/ staff Communication: Discussed plan of care with patient.  Labs/tests ordered:  CBC and BMP on 08/04/18  Goals of care:   Short-term rehabilitation.   Durenda Age, NP [facility] [phone] (Monday-Friday 8:00 a.m. - 5:00 p.m.) [phone] (after hours)

## 2018-08-03 ENCOUNTER — Encounter: Payer: Self-pay | Admitting: Internal Medicine

## 2018-08-03 ENCOUNTER — Non-Acute Institutional Stay (SKILLED_NURSING_FACILITY): Payer: Medicare Other | Admitting: Internal Medicine

## 2018-08-03 DIAGNOSIS — A419 Sepsis, unspecified organism: Secondary | ICD-10-CM | POA: Diagnosis not present

## 2018-08-03 DIAGNOSIS — E1143 Type 2 diabetes mellitus with diabetic autonomic (poly)neuropathy: Secondary | ICD-10-CM

## 2018-08-03 DIAGNOSIS — E1165 Type 2 diabetes mellitus with hyperglycemia: Secondary | ICD-10-CM

## 2018-08-03 DIAGNOSIS — G9341 Metabolic encephalopathy: Secondary | ICD-10-CM

## 2018-08-03 DIAGNOSIS — Z794 Long term (current) use of insulin: Secondary | ICD-10-CM

## 2018-08-03 DIAGNOSIS — N390 Urinary tract infection, site not specified: Secondary | ICD-10-CM | POA: Diagnosis not present

## 2018-08-03 DIAGNOSIS — IMO0002 Reserved for concepts with insufficient information to code with codable children: Secondary | ICD-10-CM

## 2018-08-03 NOTE — Assessment & Plan Note (Signed)
Continue oral Keflex until 08/04/2018

## 2018-08-03 NOTE — Progress Notes (Signed)
NURSING HOME LOCATION:  [facility] ROOM NUMBER:  120-A  CODE STATUS:  Full Code  PCP:  Unable to remember name of PCP  This is a comprehensive admission note to [facility] performed on this date less than 30 days from date of admission.  Included are preadmission medical/surgical history; reconciled medication list; family history; social history and comprehensive review of systems.   Corrections and additions to the records were documented. Comprehensive physical exam was also performed. Additionally a clinical summary was entered for each active diagnosis pertinent to this admission in the Problem List to enhance continuity of care.  HPI: The patient was hospitalized 12/13-12/17/2019 with urosepsis in the context of CKD, stage III and history of poorly controlled type 1 diabetes.  He presented to the ED with confusion, vomiting, chills, and anorexia with poor oral intake.  Initially he received empiric vancomycin and cefepime with subsequent transition to IV ceftriaxone.  Normal saline bolus was administered in the ED.  Sepsis physiology and acute encephalopathy resolved.  CT of the head revealed no acute changes. Culture revealed only 50,000 colonies of Citrobacter koseri.  He was transitioned to oral Keflex after 2 days of IV ceftriaxone to complete 7 day total of antibiotics.  1 blood culture revealed coagulase-negative staph, MRSA PCR negative felt to be a contaminant.  CT abdomen/pelvis revealed no acute findings. He had been on basal insulin 30 units at bedtime and short acting 8 units 3 times a day.  Insulin was held initially because of hypoglycemia and he received D50 x2.  He remained persistently hypoglycemic and was placed on D10 infusion.  A1c of 13.2% was compatible with poor outpatient control of his diabetes.  He was transitioned to Lantus 30 units daily with continuation of short acting insulin ac. Creatinine was 1.38 @ presentation.  Normocytic anemia was felt  to be dilutional from the IV fluids.  Hemoglobin did remain stable in the 9 g range prior to discharge.  Thrombocytopenia was attributed to infection versus medication effect.  This was improving serially.  Initial TSH was elevated but repeat TSH, free T3, and free T4 were normal. Significantly he had been discharged from [facility]/22/2018 for UTI in the context of hyperglycemia.  Culture at that time revealed Citrobacter for which he had received IV ceftriaxone.  Past medical and surgical history: Other diagnoses include chronic myeloid leukemia, essential hypertension, diabetic polyneuropathy, dyslipidemia, medical noncompliance, chronic diastolic congestive heart failure, symptomatic BPH, and GERD. Surgeries and procedures include I&D of abscesses, back surgery, and cholecystectomy.  Social history: Patient lives alone and ambulates with a Rollator.  Family reported that prior to admission he had been independent of his ADLs and coherent.  His son Inocente Salles and daughter-in-law Langley Gauss live in [place].  He stated his son's phone number was [phone] ( not answered). Nondrinker, former smoker.  Family history: Reviewed   Review of systems: Initially when I entered the room he was lying in the left lateral decubitus position with the sheets pulled over his head and talking. He told me that he was "praying" and "just resting my stomach". He gave the date as August 03, 2018.  He knew the president and knew that impingement proceedings were in process.  He stated if Trump were "not impeached  then the Constitution would have to be re-done as the guiding principal is [employer]in[employer](?)". He could not remember his doctor's name.  He states his endocrinologist has retired.  He does not check his sugars as he does  not have a meter.  Even if he had a meter he says he could not use it because his hands are "numb".  He denies any numbness or tingling in his feet.  He describes "sores" all over from being "poked" in the  hospital.  He asked me if I had asked these questions of him last week.  I affirmed that we had not met before. He describes intermittent upper abdominal pain and occasional dysphagia & dyspepsia.  He states he takes Pepto-Bismol for the abdominal pain.  He stated that he has had upper Endo as well as a colonoscopy and they found "a little bit of scratch" as he moves his hand across the upper abdomen. He is on a PPI. He also validates headaches over the crown intermittently for which he takes Tylenol.  He has intermittent cough which is essentially nonproductive.  He also described a sore on the right foot for which he was seen a podiatrist.  I found no lesion on the right foot.  Constitutional: No fever, significant weight change, fatigue  Eyes: No redness, discharge, pain, vision change ENT/mouth: No nasal congestion, purulent discharge, earache, change in hearing, sore throat  Cardiovascular: No chest pain, palpitations, paroxysmal nocturnal dyspnea, claudication, edema  Respiratory: No cough, sputum production, hemoptysis, DOE, significant snoring, apnea  Gastrointestinal: No abdominal nausea /vomiting, rectal bleeding, melena, change in bowels Genitourinary: No dysuria, hematuria, pyuria, incontinence, nocturia Musculoskeletal: No joint stiffness, joint swelling, weakness, pain Dermatologic: No rash, pruritus Neurologic: No dizziness, headache, syncope, seizures, tingling Psychiatric: No significant anxiety, depression, insomnia, anorexia Endocrine: No change in hair/skin/nails, excessive thirst, excessive hunger, excessive urination  Hematologic/lymphatic: No lymphadenopathy, abnormal bleeding Allergy/immunology: No urticaria, angioedema. He is on Singulair & Astelin nasal spray  Physical exam:  Pertinent or positive findings: See the initial interaction as outlined in the review of systems above.  He has pattern alopecia.  He has a beard and mustache.  Pupils are pinpoint.  He is  edentulous.  Pedal pulses are decreased.  He has deformities of the toenails.  As stated I found no lesion over the right foot.  He is asymmetrically weak; lower extremities are clinically weaker than the upper extremities.  Interosseous muscle wasting in the hands.  There are partial flexion contractures of the third-fifth left fingers.  He does have scattered scarring and bruising over the upper extremities.  General appearance: increased work of breathing is present.  Lymphatic: No lymphadenopathy about the head, neck, axilla. Eyes: No conjunctival inflammation or lid edema is present. There is no scleral icterus. Ears:  External ear exam shows no significant lesions or deformities.   Nose:  External nasal examination shows no deformity or inflammation. Nasal mucosa are pink and moist without lesions, exudates Oral exam: Lips and gums are healthy appearing.There is no oropharyngeal erythema or exudate. Neck:  No thyromegaly, masses, tenderness noted.    Heart:  Normal rate and regular rhythm. S1 and S2 normal without gallop, murmur, click, rub.  Lungs: Chest clear to auscultation without wheezes, rhonchi, rales, rubs. Abdomen: Bowel sounds are normal.  Abdomen is soft and nontender with no organomegaly, hernias, masses. GU: Deferred  Extremities:  No cyanosis, clubbing, edema. Neurologic exam: Balance, Rhomberg, finger to nose testing could not be completed due to clinical state Skin: Warm & dry w/o tenting. No significant rash.  See clinical summary under each active problem in the Problem List with associated updated therapeutic plan

## 2018-08-03 NOTE — Assessment & Plan Note (Signed)
History of noncompliance 12/13-12/17/2019 profound hypoglycemia required D50 X2 and D10 infusion A1c 13.2% documenting poor control. Dramatic variation in glucoses at SNF with values ranging from 68 up to 398.

## 2018-08-03 NOTE — Assessment & Plan Note (Addendum)
Mental status testing by ST @ SNF is clinically indicated based on his somewhat bizarre history (see ROS 08/03/2018)

## 2018-08-05 NOTE — Patient Instructions (Signed)
See assessment and plan under each diagnosis in the problem list and acutely for this visit 

## 2018-08-10 ENCOUNTER — Encounter: Payer: Self-pay | Admitting: Adult Health

## 2018-08-10 ENCOUNTER — Non-Acute Institutional Stay (SKILLED_NURSING_FACILITY): Payer: Medicare Other | Admitting: Adult Health

## 2018-08-10 DIAGNOSIS — A419 Sepsis, unspecified organism: Secondary | ICD-10-CM | POA: Diagnosis not present

## 2018-08-10 DIAGNOSIS — I10 Essential (primary) hypertension: Secondary | ICD-10-CM

## 2018-08-10 DIAGNOSIS — Z794 Long term (current) use of insulin: Secondary | ICD-10-CM

## 2018-08-10 DIAGNOSIS — N390 Urinary tract infection, site not specified: Secondary | ICD-10-CM

## 2018-08-10 DIAGNOSIS — E785 Hyperlipidemia, unspecified: Secondary | ICD-10-CM

## 2018-08-10 DIAGNOSIS — K219 Gastro-esophageal reflux disease without esophagitis: Secondary | ICD-10-CM

## 2018-08-10 DIAGNOSIS — E1165 Type 2 diabetes mellitus with hyperglycemia: Secondary | ICD-10-CM | POA: Diagnosis not present

## 2018-08-10 DIAGNOSIS — J301 Allergic rhinitis due to pollen: Secondary | ICD-10-CM

## 2018-08-10 DIAGNOSIS — N400 Enlarged prostate without lower urinary tract symptoms: Secondary | ICD-10-CM

## 2018-08-10 NOTE — Progress Notes (Signed)
Location:  [facility] Room Number: 120-A Place of Service:  SNF (31) Provider:  Durenda Age, NP  Patient Care Team: Patient, No Pcp Per as PCP - General (General Practice)  Extended Emergency Contact Information Primary Emergency Contact: Rann,Helen Address: [address]          [place] [address] [place] Phone: [phone] Relation: Other Secondary Emergency Contact: Largent,[name] Address: [place] [address] [place] Phone: [phone] Mobile Phone: [phone] Relation: Son  Code Status:  Full Code  Goals of care: Advanced Directive information Advanced Directives 07/28/2018  Does Patient Have a Medical Advance Directive? No  Would patient like information on creating a medical advance directive? No - Patient declined     Chief Complaint  Patient presents with  . Discharge Note    HPI:  Pt is a 77 y.o. male seen today for discharge. He will discharge home on 08/11/18 with Home health PT and OT.  He has been admitted to [facility] on 08/01/18 from [facility] (hospital stay 12/13 - 12/17) for sepsis secondary to UTI.  He was recently hospitalized at [facility] for UTI with hyperglycemia without ketosis.  Urine culture grew Citrobacter and was treated with IV ceftriaxone and was discharged home on 11/17.  He went back to ED with confusion, vomiting, chills and poor oral intake.  CT head without acute findings.  He was diagnosed to be septic due to UTI and persistent hypoglycemia.  He was empirically treated with vancomycin and cefepime then shifted to IV ceftriaxone.  He was given normal saline bolus.  Urine culture showed 50,000 colonies of Citrobacter koseri.  He completed 2 days of IV ceftriaxone then changed to oral Keflex which was completed for 7 days.  Blood culture (1/4) showed coagulase-negative staph (MRSA negative) which was thought to  be likely a contaminant.  His Toujeo 30 units nightly and U-100 8 units 3 times a day were held due to hypoglycemia.  He was given D50 x2 but continued to be hypoglycemic and was placed on D10 infusion but subsequently became hyperglycemic so D10 was discontinued.  A1c was 13.2 suggesting poor outpatient glucose control/compliance.  He was transitioned to Lantus 30 units daily and home dose of Apidra 8 units 3 times daily before meals. Marland Kitchen   He was seen in his room today after having rehabilitation exercises. He verbalized that he has been living home by himself for a while, has a brother that comes by to take him to the grocery store and son occasionally visits.   Patient was admitted to this facility for short-term rehabilitation after the patient's recent hospitalization.  Patient has completed SNF rehabilitation and therapy has cleared the patient for discharge.   Past Medical History:  Diagnosis Date  . Diabetes mellitus with neuropathy ([place])   . Hyperlipidemia   . Hypertension   . Leukemia ([place])   . MRSA infection 09/19/2015   left arm  . Urinary retention 01/11/2014   Past Surgical History:  Procedure Laterality Date  . APPENDECTOMY    . BACK SURGERY    . CHOLECYSTECTOMY    . EYE SURGERY    . INCISE AND DRAIN ABCESS Left 09/16/2015   forearm; debscess undere MAC; [facility]; Dr Curley Spice  . INCISION AND DRAINAGE ABSCESS Left 09/19/15   forearm wound washout with pulavac at [facility] by Dr Curley Spice  Allergies  Allergen Reactions  . Influenza Vaccines Hives  . Aspirin Other (See Comments)    DIZZINESS, UNCONSCIOUSNESS 09/16/15 pt takes 81 mg aspirin at home.[name], RPh  . Cyclobenzaprine Hives  . Dextromethorphan Other (See Comments)    Unknown   . Doxycycline Nausea Only  . Flu Virus Vaccine   . Gabapentin Other (See Comments)    GI UPSET,DROWSINESS  . Glipizide Other (See Comments)    DIZZINESS  . Hydrocodone-Acetaminophen Other (See Comments)    Unknown    . Iodinated Diagnostic Agents Other (See Comments)    Unknown   . Latex Other (See Comments)    Unknown   . Lisinopril Other (See Comments)    COUGH  . Pregabalin Other (See Comments)    GI UPSET,DROWSINESS  . Ropinirole Nausea Only  . Salicylates Other (See Comments)    Unknown   . Sulfamethoxazole-Trimethoprim Nausea Only  . Oxycodone-Acetaminophen Hives and Nausea Only  . Penicillins Hives, Itching and Rash    Has patient had a PCN reaction causing immediate rash, facial/tongue/throat swelling, SOB or lightheadedness with hypotension: Yes Has patient had a PCN reaction causing severe rash involving mucus membranes or skin necrosis: No Has patient had a PCN reaction that required hospitalization: No Has patient had a PCN reaction occurring within the last 10 years: No If all of the above answers are "NO", then may proceed with Cephalosporin use.     Outpatient Encounter Medications as of 08/10/2018  Medication Sig  . acetaminophen (TYLENOL) 325 MG tablet Take 325 mg by mouth every 6 (six) hours as needed for mild pain, moderate pain or headache.   Marland Kitchen aspirin EC 81 MG tablet Take 81 mg by mouth every other day.   Marland Kitchen atorvastatin (LIPITOR) 80 MG tablet Take 80 mg by mouth daily.  Marland Kitchen azelastine (ASTELIN) 0.1 % nasal spray Place 1 spray into both nostrils 2 (two) times daily as needed for rhinitis or allergies.  Marland Kitchen ergocalciferol (VITAMIN D2) 1.25 MG (50000 UT) capsule Take 50,000 Units by mouth every Monday.  . finasteride (PROSCAR) 5 MG tablet Take 5 mg by mouth daily.   . fluticasone (FLONASE) 50 MCG/ACT nasal spray Place 2 sprays into both nostrils daily as needed for allergies.   . Insulin Glargine (LANTUS SOLOSTAR Pink) Inject 30 Units into the skin every morning.  . insulin glulisine (APIDRA) 100 UNIT/ML injection Inject 8 Units into the skin 3 (three) times daily before meals.  . metoprolol succinate (TOPROL-XL) 25 MG 24 hr tablet Take 25 mg by mouth daily.   . montelukast  (SINGULAIR) 10 MG tablet Take 10 mg by mouth at bedtime.  . nitroGLYCERIN (NITROSTAT) 0.4 MG SL tablet Place 1 tablet (0.4 mg total) under the tongue every 5 (five) minutes as needed for chest pain.  Marland Kitchen omeprazole (PRILOSEC) 40 MG capsule Take 40 mg by mouth daily.   . tamsulosin (FLOMAX) 0.4 MG CAPS capsule Take 0.4 mg by mouth daily.    No facility-administered encounter medications on file as of 08/10/2018.     Review of Systems  GENERAL: No change in appetite, no fatigue, no weight changes, no fever, chills or weakness MOUTH and THROAT: Denies oral discomfort, gingival pain or bleeding RESPIRATORY: no cough, SOB, DOE, wheezing, hemoptysis CARDIAC: No chest pain, edema or palpitations GI: No abdominal pain, diarrhea, constipation, heart burn, nausea or vomiting GU: Denies dysuria, frequency, hematuria, or discharge PSYCHIATRIC: Denies feelings of depression or anxiety. No report of hallucinations, insomnia, paranoia, or agitation  Immunization History  Administered Date(s) Administered  . Influenza, Seasonal, Injecte, Preservative Fre 08/21/2012  . Influenza-Unspecified 08/21/2012  . Pneumococcal Conjugate-13 02/28/2015, 11/25/2015  . Pneumococcal Polysaccharide-23 08/26/2016   Pertinent  Health Maintenance Due  Topic Date Due  . FOOT EXAM  08/14/1951  . OPHTHALMOLOGY EXAM  08/14/1951  . URINE MICROALBUMIN  08/14/1951  . COLONOSCOPY  08/14/1993  . INFLUENZA VACCINE  03/16/2018  . HEMOGLOBIN A1C  01/27/2019  . PNA vac Low Risk Adult  Completed   No flowsheet data found. Functional Status Survey:    Vitals:   08/10/18 1106  BP: 127/76  Pulse: 70  Temp: 98.2 F (36.8 C)  Weight: 157 lb 3.2 oz (71.3 kg)  Height: _0  (1.854 m)   Body mass index is 20.74 kg/m.  Physical Exam  GENERAL APPEARANCE: Well nourished. In no acute distress. Normal body habitus SKIN:  Skin is warm and dry.  MOUTH and THROAT: Lips are without lesions. Oral mucosa is moist and without  lesions. Only has upper denture RESPIRATORY: Breathing is even & unlabored, BS CTAB CARDIAC: RRR, no murmur,no extra heart sounds, no edema GI: Abdomen soft, normal BS, no masses, no tenderness EXTREMITIES:  Able to move X 4 extremities NEUROLOGICAL: There is no tremor. Speech is clear. Alert and oriented X 3. PSYCHIATRIC:  Affect and behavior are appropriate   Labs reviewed: Recent Labs    07/28/18 0907 07/28/18 0911 07/28/18 1430 07/29/18 0614 07/30/18 0620  NA 137 138  --  132* 135  K 3.1* 3.1*  --  4.3 4.4  CL 97* 99  --  98 104  CO2 26  --   --  26 24  GLUCOSE 49* 44*  --  312* 334*  BUN 29* 31*  --  24* 26*  CREATININE 1.38* 1.40* 1.18 1.14 1.02  CALCIUM 9.4  --   --  8.4* 8.2*  MG  --   --  2.1  --   --    Recent Labs    07/28/18 0907  AST 32  ALT 22  ALKPHOS 101  BILITOT 0.9  PROT 8.2*  ALBUMIN 4.4   Recent Labs    07/28/18 0907  07/30/18 0620 07/31/18 0527 08/01/18 0623  WBC 13.7*   < > 5.7 5.5 5.0  NEUTROABS 9.0*  --   --   --   --   HGB 12.6*   < > 9.9* 9.3* 9.6*  HCT 37.3*   < > 30.4* 28.7* 30.2*  MCV 83.6   < > 88.1 89.4 88.3  PLT 275   < > 146* 139* 146*   < > = values in this interval not displayed.   Lab Results  Component Value Date   TSH 2.322 07/28/2018   Lab Results  Component Value Date   HGBA1C 13.2 (H) 07/28/2018   No results found for: CHOL, HDL, LDLCALC, LDLDIRECT, TRIG, CHOLHDL  Significant Diagnostic Results in last 30 days:  Dg Chest 2 View  Result Date: 07/28/2018 CLINICAL DATA:  Altered mental status with nausea and vomiting. Hypertension. EXAM: CHEST - 2 VIEW COMPARISON:  June 20, 2018 FINDINGS: No edema or consolidation. Heart is upper normal in size with pulmonary vascularity normal. No adenopathy. No bone lesions. IMPRESSION: No edema or consolidation.  Heart upper normal in size. Electronically Signed   By: Lowella Grip III M.D.   On: 07/28/2018 09:43   Ct Head Wo Contrast  Result Date:  07/28/2018 CLINICAL DATA:  Altered mental status with nausea  and vomiting. EXAM: CT HEAD WITHOUT CONTRAST TECHNIQUE: Contiguous axial images were obtained from the base of the skull through the vertex without intravenous contrast. COMPARISON:  September 26, 2016 FINDINGS: Brain: There is age related volume loss, stable. There is no intracranial mass, hemorrhage, extra-axial fluid collection, or midline shift. There is slight small vessel disease in the centra semiovale bilaterally. No acute appearing infarct is demonstrable on this study. Vascular: There is no appreciable hyperdense vessel. There is calcification in each distal vertebral artery as well as in each carotid siphon region. Skull: The bony calvarium appears intact. Sinuses/Orbits: There is mucosal thickening in several ethmoid air cells bilaterally. Other visualized paranasal sinuses are clear. There is evidence of prior scleral banding on the right. Orbits appear symmetric elsewhere bilaterally. Other: Mastoid air cells are clear. IMPRESSION: Age related volume loss with mild periventricular small vessel disease. No acute infarct evident. No mass or hemorrhage. Foci of arterial vascular calcification noted. Mucosal thickening noted in several ethmoid air cells. Scleral banding right globe. Electronically Signed   By: Lowella Grip III M.D.   On: 07/28/2018 09:36   Ct Renal Stone Study  Result Date: 07/28/2018 CLINICAL DATA:  Renal failure.  Vomiting.  Lactic acidosis. EXAM: CT ABDOMEN AND PELVIS WITHOUT CONTRAST TECHNIQUE: Multidetector CT imaging of the abdomen and pelvis was performed following the standard protocol without IV contrast. COMPARISON:  02/11/2018 FINDINGS: Lower chest: Moderate to diffuse coronary artery calcifications. Heart is normal size. Hepatobiliary: No focal hepatic abnormality.  Prior cholecystectomy Pancreas: No focal abnormality or ductal dilatation. Spleen: No focal abnormality.  Normal size. Adrenals/Urinary Tract:  No adrenal abnormality. No focal renal abnormality. No stones or hydronephrosis. Bladder wall diverticula noted both right lateral and left lateral. The largest is on the right measuring up to 5.6 cm. Stomach/Bowel: Stomach, large and small bowel grossly unremarkable. Vascular/Lymphatic: Aortic atherosclerosis. No enlarged abdominal or pelvic lymph nodes. Reproductive: No visible focal abnormality. Other: No free fluid or free air. Musculoskeletal: No acute bony abnormality. IMPRESSION: No renal or ureteral stones. No hydronephrosis. Two bladder wall diverticula, the largest on the right measuring up to 5.6 cm. Prior cholecystectomy. Aortic atherosclerosis. No acute findings in the abdomen or pelvis. Electronically Signed   By: Rolm Baptise M.D.   On: 07/28/2018 11:50    Assessment/Plan  1. Sepsis secondary to UTI [facility]) - has completed antibiotic therapy, resolved   2. Type 2 diabetes mellitus with hyperglycemia, with long-term current use of insulin (HCC) -stable, continue Lantus 100 units/mL inject 30 units subcu daily, Apidra 100 unit/mL inject 8 units subcutaneously before each meal 3 times a day, CBG before meals and at bedtime Lab Results  Component Value Date   HGBA1C 13.2 (H) 07/28/2018    3. Gastroesophageal reflux disease without esophagitis -continue omeprazole 40 mg 1 capsule daily   4. Hyperlipidemia LDL goal <100 -continue atorvastatin 80 mg 1 tab nightly   5. Benign fibroma of prostate -continue finasteride 5 mg 1 tab daily and tamsulosin 0.4 mg 1 capsule daily   6. Allergic rhinitis due to pollen, unspecified seasonality -continue montelukast 10 mg 1 tab nightly and fluticasone 50 mcg spray instill 2 sprays in each nostril daily as needed   7. Essential (primary) hypertension -continue metoprolol succinate ER 25 mg 1 tab daily     I have filled out patient's discharge paperwork and written prescriptions.  Patient will receive home health PT and OT.  DME provided:   Wheelchair, wheelchair cushion, anti-tippers, ELRs  Total discharge time:  Greater than 30 minutes Greater than 50% was spent in counseling and coordination of care.   Discharge time involved coordination of the discharge process with social worker, nursing staff and therapy department. Medical justification for home health services/DME verified.    Durenda Age, NP [facility] [phone] (Monday-Friday 8:00 a.m. - 5:00 p.m.) [phone] (after hours)

## 2018-10-09 ENCOUNTER — Emergency Department (HOSPITAL_BASED_OUTPATIENT_CLINIC_OR_DEPARTMENT_OTHER)
Admission: EM | Admit: 2018-10-09 | Discharge: 2018-10-09 | Disposition: A | Discharge status: Home / Self Care | Payer: Medicare Other | Attending: Emergency Medicine | Admitting: Emergency Medicine

## 2018-10-09 ENCOUNTER — Other Ambulatory Visit: Payer: Self-pay

## 2018-10-09 ENCOUNTER — Encounter (HOSPITAL_BASED_OUTPATIENT_CLINIC_OR_DEPARTMENT_OTHER): Payer: Self-pay

## 2018-10-09 DIAGNOSIS — Z7982 Long term (current) use of aspirin: Secondary | ICD-10-CM | POA: Insufficient documentation

## 2018-10-09 DIAGNOSIS — I509 Heart failure, unspecified: Secondary | ICD-10-CM | POA: Insufficient documentation

## 2018-10-09 DIAGNOSIS — Z79899 Other long term (current) drug therapy: Secondary | ICD-10-CM | POA: Diagnosis not present

## 2018-10-09 DIAGNOSIS — Z87891 Personal history of nicotine dependence: Secondary | ICD-10-CM | POA: Diagnosis not present

## 2018-10-09 DIAGNOSIS — N183 Chronic kidney disease, stage 3 (moderate): Secondary | ICD-10-CM | POA: Diagnosis not present

## 2018-10-09 DIAGNOSIS — E1122 Type 2 diabetes mellitus with diabetic chronic kidney disease: Secondary | ICD-10-CM | POA: Insufficient documentation

## 2018-10-09 DIAGNOSIS — Z794 Long term (current) use of insulin: Secondary | ICD-10-CM | POA: Insufficient documentation

## 2018-10-09 DIAGNOSIS — I2510 Atherosclerotic heart disease of native coronary artery without angina pectoris: Secondary | ICD-10-CM | POA: Diagnosis not present

## 2018-10-09 DIAGNOSIS — I1310 Hypertensive heart and chronic kidney disease without heart failure, with stage 1 through stage 4 chronic kidney disease, or unspecified chronic kidney disease: Secondary | ICD-10-CM | POA: Diagnosis not present

## 2018-10-09 DIAGNOSIS — E1140 Type 2 diabetes mellitus with diabetic neuropathy, unspecified: Secondary | ICD-10-CM | POA: Insufficient documentation

## 2018-10-09 DIAGNOSIS — R739 Hyperglycemia, unspecified: Secondary | ICD-10-CM

## 2018-10-09 DIAGNOSIS — E1165 Type 2 diabetes mellitus with hyperglycemia: Secondary | ICD-10-CM | POA: Insufficient documentation

## 2018-10-09 LAB — CBC WITH DIFFERENTIAL/PLATELET
Abs Immature Granulocytes: 0.01 10*3/uL (ref 0.00–0.07)
BASOS ABS: 0 10*3/uL (ref 0.0–0.1)
Basophils Relative: 1 %
Eosinophils Absolute: 0.3 10*3/uL (ref 0.0–0.5)
Eosinophils Relative: 4 %
HCT: 31.4 % — ABNORMAL LOW (ref 39.0–52.0)
Hemoglobin: 10.5 g/dL — ABNORMAL LOW (ref 13.0–17.0)
Immature Granulocytes: 0 %
Lymphocytes Relative: 21 %
Lymphs Abs: 1.3 10*3/uL (ref 0.7–4.0)
MCH: 27.9 pg (ref 26.0–34.0)
MCHC: 33.4 g/dL (ref 30.0–36.0)
MCV: 83.3 fL (ref 80.0–100.0)
Monocytes Absolute: 0.7 10*3/uL (ref 0.1–1.0)
Monocytes Relative: 12 %
NEUTROS ABS: 3.7 10*3/uL (ref 1.7–7.7)
Neutrophils Relative %: 62 %
Platelets: 182 10*3/uL (ref 150–400)
RBC: 3.77 MIL/uL — ABNORMAL LOW (ref 4.22–5.81)
RDW: 13.2 % (ref 11.5–15.5)
WBC: 6 10*3/uL (ref 4.0–10.5)
nRBC: 0 % (ref 0.0–0.2)

## 2018-10-09 LAB — BASIC METABOLIC PANEL
Anion gap: 7 (ref 5–15)
BUN: 26 mg/dL — ABNORMAL HIGH (ref 8–23)
CO2: 24 mmol/L (ref 22–32)
Calcium: 8.8 mg/dL — ABNORMAL LOW (ref 8.9–10.3)
Chloride: 101 mmol/L (ref 98–111)
Creatinine, Ser: 1.01 mg/dL (ref 0.61–1.24)
GFR calc non Af Amer: >60 mL/min (ref >60)
Glucose, Bld: 353 mg/dL — ABNORMAL HIGH (ref 70–99)
Potassium: 4.5 mmol/L (ref 3.5–5.1)
Sodium: 132 mmol/L — ABNORMAL LOW (ref 135–145)

## 2018-10-09 LAB — CBG MONITORING, ED
Glucose-Capillary: 331 mg/dL — ABNORMAL HIGH (ref 70–99)
Glucose-Capillary: 316 mg/dL — ABNORMAL HIGH (ref 70–99)

## 2018-10-09 MED ORDER — INSULIN REGULAR HUMAN 100 UNIT/ML IJ SOLN
2.0000 [IU] | Freq: Once | INTRAMUSCULAR | Status: AC
  Administered 2018-10-09: 2 [IU] via SUBCUTANEOUS
  Filled 2018-10-09: qty 1

## 2018-10-09 MED ORDER — ACETAMINOPHEN 500 MG PO TABS
1000.0000 mg | ORAL_TABLET | Freq: Once | ORAL | Status: AC
  Administered 2018-10-09: 1000 mg via ORAL
  Filled 2018-10-09: qty 2

## 2018-10-09 MED ORDER — SODIUM CHLORIDE 0.9 % IV BOLUS
500.0000 mL | Freq: Once | INTRAVENOUS | Status: AC
  Administered 2018-10-09: 500 mL via INTRAVENOUS

## 2018-10-09 MED FILL — Insulin Regular (Human) Inj 100 Unit/ML: INTRAMUSCULAR | Qty: 0.02 | Status: AC

## 2018-10-09 NOTE — ED Notes (Signed)
Family at bedside. 

## 2018-10-09 NOTE — ED Provider Notes (Signed)
[facility] Provider Note   CSN: [id number] Arrival date & time: 10/09/18  0309    History   Chief Complaint Chief Complaint  Patient presents with  . Hyperglycemia    HPI Landin Tallon is a 78 y.o. male.     The history is provided by the patient and a relative.  Hyperglycemia  Blood sugar level PTA:  450s Severity:  Moderate Onset quality:  Gradual Duration:  6 weeks Timing:  Constant Progression:  Unchanged Chronicity:  Chronic Diabetes status:  Controlled with insulin Current diabetic therapy:  Noncompiance with insulin Context: noncompliance   Context: not change in medication   Relieved by:  Nothing Ineffective treatments:  None tried Associated symptoms: no abdominal pain, no confusion, no dehydration, no diaphoresis, no dysuria, no fatigue, no fever, no increased appetite, no increased thirst, no malaise, no shortness of breath, no syncope and no weight change   Risk factors: no hx of DKA     Past Medical History:  Diagnosis Date  . Diabetes mellitus with neuropathy ([place])   . Hyperlipidemia   . Hypertension   . Leukemia ([place])   . MRSA infection 09/19/2015   left arm  . Urinary retention 01/11/2014    Patient Active Problem List   Diagnosis Date Noted  . Sepsis secondary to UTI ([place]) 07/28/2018  . Uncontrolled diabetes mellitus with diabetic autonomic neuropathy, with long-term current use of insulin ([place]) 07/28/2018  . Acute metabolic encephalopathy 25/12/3974  . Abscess of forearm 09/25/2015  . Hyperlipidemia LDL goal <100 09/25/2015  . Infection with methicillin-resistant Staphylococcus aureus 09/19/2015  . Cellulitis of upper extremity 09/15/2015  . Fall in home 09/15/2015  . Muscle ache 09/15/2015  . Type 2 diabetes mellitus with hyperglycemia ([place]) 09/15/2015  . Avitaminosis D 08/05/2015  . Chronic kidney disease (CKD), stage III (moderate) ([place]) 05/13/2015  . Long term current use of insulin ([place]) 05/13/2015  .  Lumbar radiculopathy 04/15/2015  . LBP (low back pain) 11/05/2014  . Excessive falling 09/25/2014  . CAD in native artery 07/26/2014  . Congestive heart failure ([place]) 07/26/2014  . 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 Accumulation of fluid in tissues 11/05/2013  . Essential (primary) hypertension 11/05/2013  . Anemia in chronic illness 10/12/2013  . Dizziness 07/31/2013  . Allergic rhinitis 01/20/2013  . Anxiety state 01/20/2013  . Carpal tunnel syndrome 01/20/2013  . 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 Acid reflux 01/20/2013  . Diabetic polyneuropathy ([place]) 01/20/2013  . Restless leg 01/20/2013  . Detached retina 01/20/2013  . Absence of bladder continence 01/20/2013  . Bladder neck obstruction 11/29/2012    Past Surgical History:  Procedure Laterality Date  . APPENDECTOMY    . BACK SURGERY    . CHOLECYSTECTOMY    . EYE SURGERY    . INCISE AND DRAIN ABCESS Left 09/16/2015   forearm; debscess undere MAC; [facility]; Dr Curley Spice  . INCISION AND DRAINAGE ABSCESS Left 09/19/15   forearm wound washout with pulavac at [facility] by Dr Curley Spice        Home Medications    Prior to Admission medications   Medication Sig Start Date End Date Taking? Authorizing Provider  acetaminophen (TYLENOL) 325 MG tablet Take 325 mg by mouth every 6 (six) hours as needed for mild pain, moderate pain or headache.  01/03/14  Yes [provider]  aspirin EC 81 MG tablet Take 81 mg  by mouth every other day.    Yes [provider]  atorvastatin (LIPITOR) 80 MG tablet Take 80 mg by mouth daily.   Yes [provider]  azelastine (ASTELIN) 0.1 % nasal spray Place 1 spray into both nostrils 2 (two) times daily as needed for rhinitis or allergies.   Yes [provider]  ergocalciferol (VITAMIN D2) 1.25 MG (50000 UT) capsule Take 50,000 Units by mouth every  Monday.   Yes [provider]  finasteride (PROSCAR) 5 MG tablet Take 5 mg by mouth daily.  08/02/14  Yes [provider]  fluticasone (FLONASE) 50 MCG/ACT nasal spray Place 2 sprays into both nostrils daily as needed for allergies.  02/28/15  Yes [provider]  insulin glulisine (APIDRA) 100 UNIT/ML injection Inject 8 Units into the skin 3 (three) times daily before meals.   Yes [provider]  metoprolol succinate (TOPROL-XL) 25 MG 24 hr tablet Take 25 mg by mouth daily.  03/19/15  Yes [provider]  montelukast (SINGULAIR) 10 MG tablet Take 10 mg by mouth at bedtime.   Yes [provider]  omeprazole (PRILOSEC) 40 MG capsule Take 40 mg by mouth daily.  02/28/15  Yes [provider]  tamsulosin (FLOMAX) 0.4 MG CAPS capsule Take 0.4 mg by mouth daily.    Yes [provider]  Insulin Glargine (LANTUS SOLOSTAR [place]) Inject 30 Units into the skin every morning.    [provider]  nitroGLYCERIN (NITROSTAT) 0.4 MG SL tablet Place 1 tablet (0.4 mg total) under the tongue every 5 (five) minutes as needed for chest pain. 08/01/18   Modena Jansky, MD    Family History Family History  Problem Relation Age of Onset  . Heart attack Other   . Diabetes Other   . Hypertension Other     Social History Social History   Tobacco Use  . Smoking status: Former Research scientist (life sciences)  . Smokeless tobacco: Never Used  Substance Use Topics  . Alcohol use: No    Alcohol/week: 0.0 standard drinks  . Drug use: No     Allergies   Influenza vaccines; Aspirin; Cyclobenzaprine; Dextromethorphan; Doxycycline; Flu virus vaccine; Gabapentin; Glipizide; Hydrocodone-acetaminophen; Iodinated diagnostic agents; Latex; Lisinopril; Pregabalin; Ropinirole; Salicylates; Sulfamethoxazole-trimethoprim; Oxycodone-acetaminophen; and Penicillins   Review of Systems Review of Systems  Constitutional: Negative for diaphoresis, fatigue and fever.    Respiratory: Negative for shortness of breath.   Cardiovascular: Negative for syncope.  Gastrointestinal: Negative for abdominal pain.  Endocrine: Negative for polydipsia.  Genitourinary: Negative for dysuria.  Musculoskeletal:       Chronic neuropathy of the feet  Psychiatric/Behavioral: Negative for confusion.  All other systems reviewed and are negative.    Physical Exam Updated Vital Signs BP (!) 173/92 (BP Location: Right Arm)   Pulse 96   Temp 98.5 F (36.9 C) (Oral)   Resp 18   Wt 71.3 kg   SpO2 100%   BMI 20.74 kg/m   Physical Exam Vitals signs and nursing note reviewed.  Constitutional:      Appearance: Normal appearance.  HENT:     Head: Normocephalic and atraumatic.     Nose: Nose normal.     Mouth/Throat:     Mouth: Mucous membranes are moist.     Pharynx: Oropharynx is clear.  Eyes:     Conjunctiva/sclera: Conjunctivae normal.     Pupils: Pupils are equal, round, and reactive to light.  Neck:     Musculoskeletal: Normal range of motion and neck supple.  Cardiovascular:     Rate and Rhythm: Normal rate and regular rhythm.     Pulses: Normal pulses.     Heart sounds: Normal heart sounds.  Pulmonary:     Effort: Pulmonary effort is normal.     Breath sounds: Normal breath sounds.  Abdominal:     General: Abdomen is flat. Bowel sounds are normal.     Tenderness: There is no abdominal tenderness. There is no guarding.  Musculoskeletal: Normal range of motion.     Right ankle: Normal. Achilles tendon normal.     Left ankle: Normal. Achilles tendon normal.     Comments: No wounds of the feet intact dorsalis pedis onchomycosis  Skin:    General: Skin is warm and dry.     Capillary Refill: Capillary refill takes less than 2 seconds.  Neurological:     General: No focal deficit present.     Mental Status: He is alert and oriented to person, place, and time.  Psychiatric:        Mood and Affect: Mood normal.        Behavior: Behavior normal.       ED Treatments / Results  Labs (all labs ordered are listed, but only abnormal results are displayed) Results for orders placed or performed during the hospital encounter of 10/09/18  CBC with Differential/Platelet  Result Value Ref Range   WBC 6.0 4.0 - 10.5 K/uL   RBC 3.77 (L) 4.22 - 5.81 MIL/uL   Hemoglobin 10.5 (L) 13.0 - 17.0 g/dL   HCT 31.4 (L) 39.0 - 52.0 %   MCV 83.3 80.0 - 100.0 fL   MCH 27.9 26.0 - 34.0 pg   MCHC 33.4 30.0 - 36.0 g/dL   RDW 13.2 11.5 - 15.5 %   Platelets 182 150 - 400 K/uL   nRBC 0.0 0.0 - 0.2 %   Neutrophils Relative % 62 %   Neutro Abs 3.7 1.7 - 7.7 K/uL   Lymphocytes Relative 21 %   Lymphs Abs 1.3 0.7 - 4.0 K/uL   Monocytes Relative 12 %   Monocytes Absolute 0.7 0.1 - 1.0 K/uL   Eosinophils Relative 4 %   Eosinophils Absolute 0.3 0.0 - 0.5 K/uL   Basophils Relative 1 %   Basophils Absolute 0.0 0.0 - 0.1 K/uL   Immature Granulocytes 0 %   Abs Immature Granulocytes 0.01 0.00 - 0.07 K/uL  Basic metabolic panel  Result Value Ref Range   Sodium 132 (L) 135 - 145 mmol/L   Potassium 4.5 3.5 - 5.1 mmol/L   Chloride 101 98 - 111 mmol/L   CO2 24 22 - 32 mmol/L   Glucose, Bld 353 (H) 70 - 99 mg/dL   BUN 26 (H) 8 - 23 mg/dL   Creatinine, Ser 1.01 0.61 - 1.24 mg/dL   Calcium 8.8 (L) 8.9 - 10.3 mg/dL   GFR calc non Af Amer >60 >60 mL/min   GFR calc Af Amer >60 >60 mL/min   Anion gap 7 5 - 15  CBG monitoring, ED  Result Value Ref Range   Glucose-Capillary 331 (H) 70 - 99 mg/dL  POC CBG, ED  Result Value Ref Range   Glucose-Capillary 316 (H) 70 - 99 mg/dL   No results found.  EKG None  Radiology No results found.  Procedures Procedures (including critical care time)  Medications Ordered in ED Medications  acetaminophen (TYLENOL) tablet 1,000 mg (1,000 mg Oral Given 10/09/18 0329)  sodium chloride 0.9 % bolus 500 mL (0  mLs Intravenous Stopped 10/09/18 0459)  insulin regular (NOVOLIN R,HUMULIN R) 100 units/mL injection 2 Units (2 Units  Subcutaneous Given 10/09/18 0459)     Saw PMD on 19th for UTI started on antibiotics but did not tell him he was out of insulin and did not have a glucometer. Moreover, patient has never been seen in our system with normal sugars.  They are always in the 300s.  Instructed son to call PMD today for order for glucometer and insulin.    Final Clinical Impressions(s) / ED Diagnoses   Return for pain, intractable cough, fevers >100.4 unrelieved by medication, shortness of breath, intractable vomiting, chest pain, shortness of breath, weakness numbness, changes in speech, facial asymmetry,abdominal pain, passing out,Inability to tolerate liquids or food, cough, altered mental status or any concerns. No signs of systemic illness or infection. The patient is nontoxic-appearing on exam and vital signs are within normal limits.   I have reviewed the triage vital signs and the nursing notes. Pertinent labs &imaging results that were available during my care of the patient were reviewed by me and considered in my medical decision making (see chart for details).  After history, exam, and medical workup I feel the patient has been appropriately medically screened and is safe for discharge home. Pertinent diagnoses were discussed with the patient. Patient was given return precautions.    Niala Stcharles, MD 10/09/18 [phone]

## 2018-10-09 NOTE — ED Triage Notes (Signed)
EMS dispatched out due to hyperglycemia; patient reports he has been out of his insulin for an unknown amount of time.

## 2018-10-09 NOTE — ED Notes (Addendum)
Patient repositioned in bed, clean brief applied and warm blankets given.

## 2018-10-09 NOTE — ED Notes (Signed)
ED Provider at bedside. 

## 2018-12-23 ENCOUNTER — Other Ambulatory Visit: Payer: Self-pay | Admitting: Internal Medicine

## 2019-04-16 ENCOUNTER — Other Ambulatory Visit: Payer: Self-pay

## 2019-04-16 ENCOUNTER — Emergency Department (HOSPITAL_BASED_OUTPATIENT_CLINIC_OR_DEPARTMENT_OTHER)
Admission: EM | Admit: 2019-04-16 | Discharge: 2019-04-16 | Disposition: A | Discharge status: Home / Self Care | Payer: Medicare Other | Attending: Emergency Medicine | Admitting: Emergency Medicine

## 2019-04-16 ENCOUNTER — Encounter (HOSPITAL_BASED_OUTPATIENT_CLINIC_OR_DEPARTMENT_OTHER): Payer: Self-pay

## 2019-04-16 DIAGNOSIS — Z794 Long term (current) use of insulin: Secondary | ICD-10-CM | POA: Diagnosis not present

## 2019-04-16 DIAGNOSIS — Z79899 Other long term (current) drug therapy: Secondary | ICD-10-CM | POA: Insufficient documentation

## 2019-04-16 DIAGNOSIS — Z856 Personal history of leukemia: Secondary | ICD-10-CM | POA: Diagnosis not present

## 2019-04-16 DIAGNOSIS — N3000 Acute cystitis without hematuria: Secondary | ICD-10-CM

## 2019-04-16 DIAGNOSIS — R197 Diarrhea, unspecified: Secondary | ICD-10-CM | POA: Diagnosis present

## 2019-04-16 DIAGNOSIS — N3090 Cystitis, unspecified without hematuria: Secondary | ICD-10-CM | POA: Insufficient documentation

## 2019-04-16 DIAGNOSIS — Z7982 Long term (current) use of aspirin: Secondary | ICD-10-CM | POA: Diagnosis not present

## 2019-04-16 DIAGNOSIS — I259 Chronic ischemic heart disease, unspecified: Secondary | ICD-10-CM | POA: Diagnosis not present

## 2019-04-16 DIAGNOSIS — E119 Type 2 diabetes mellitus without complications: Secondary | ICD-10-CM | POA: Insufficient documentation

## 2019-04-16 DIAGNOSIS — I10 Essential (primary) hypertension: Secondary | ICD-10-CM | POA: Insufficient documentation

## 2019-04-16 DIAGNOSIS — Z87891 Personal history of nicotine dependence: Secondary | ICD-10-CM | POA: Insufficient documentation

## 2019-04-16 LAB — CBC WITH DIFFERENTIAL/PLATELET
Abs Immature Granulocytes: 0.01 10*3/uL (ref 0.00–0.07)
Basophils Absolute: 0.1 10*3/uL (ref 0.0–0.1)
Basophils Relative: 1 %
Eosinophils Absolute: 0.3 10*3/uL (ref 0.0–0.5)
Eosinophils Relative: 3 %
HCT: 33.4 % — ABNORMAL LOW (ref 39.0–52.0)
Hemoglobin: 11.2 g/dL — ABNORMAL LOW (ref 13.0–17.0)
Immature Granulocytes: 0 %
Lymphocytes Relative: 25 %
Lymphs Abs: 1.9 10*3/uL (ref 0.7–4.0)
MCH: 27.5 pg (ref 26.0–34.0)
MCHC: 33.5 g/dL (ref 30.0–36.0)
MCV: 82.1 fL (ref 80.0–100.0)
Monocytes Absolute: 1 10*3/uL (ref 0.1–1.0)
Monocytes Relative: 13 %
Neutro Abs: 4.4 10*3/uL (ref 1.7–7.7)
Neutrophils Relative %: 58 %
Platelets: 211 10*3/uL (ref 150–400)
RBC: 4.07 MIL/uL — ABNORMAL LOW (ref 4.22–5.81)
RDW: 14.2 % (ref 11.5–15.5)
WBC: 7.6 10*3/uL (ref 4.0–10.5)
nRBC: 0 % (ref 0.0–0.2)

## 2019-04-16 LAB — URINALYSIS, ROUTINE W REFLEX MICROSCOPIC
Bilirubin Urine: NEGATIVE
Glucose, UA: 100 mg/dL — AB
Ketones, ur: NEGATIVE mg/dL
Nitrite: POSITIVE — AB
Protein, ur: 100 mg/dL — AB
Specific Gravity, Urine: 1.025 (ref 1.005–1.030)
pH: 5.5 (ref 5.0–8.0)

## 2019-04-16 LAB — URINALYSIS, MICROSCOPIC (REFLEX): RBC / HPF: >50 RBC/hpf (ref 0–5)

## 2019-04-16 LAB — COMPREHENSIVE METABOLIC PANEL
ALT: 14 U/L (ref 0–44)
AST: 19 U/L (ref 15–41)
Albumin: 3.7 g/dL (ref 3.5–5.0)
Alkaline Phosphatase: 71 U/L (ref 38–126)
Anion gap: 10 (ref 5–15)
BUN: 17 mg/dL (ref 8–23)
CO2: 23 mmol/L (ref 22–32)
Calcium: 9 mg/dL (ref 8.9–10.3)
Chloride: 104 mmol/L (ref 98–111)
Creatinine, Ser: 0.96 mg/dL (ref 0.61–1.24)
GFR calc Af Amer: >60 mL/min (ref >60)
GFR calc non Af Amer: >60 mL/min (ref >60)
Glucose, Bld: 171 mg/dL — ABNORMAL HIGH (ref 70–99)
Potassium: 3.3 mmol/L — ABNORMAL LOW (ref 3.5–5.1)
Sodium: 137 mmol/L (ref 135–145)
Total Bilirubin: 0.5 mg/dL (ref 0.3–1.2)
Total Protein: 7.6 g/dL (ref 6.5–8.1)

## 2019-04-16 MED ORDER — NITROFURANTOIN MONOHYD MACRO 100 MG PO CAPS: 100.0000 mg | ORAL_CAPSULE | Freq: Two times a day (BID) | ORAL | 0 refills | Status: DC

## 2019-04-16 MED ORDER — NITROFURANTOIN MONOHYD MACRO 100 MG PO CAPS
100.0000 mg | ORAL_CAPSULE | Freq: Once | ORAL | Status: AC
  Administered 2019-04-16: 22:00:00 100 mg via ORAL
  Filled 2019-04-16: qty 1

## 2019-04-16 NOTE — ED Provider Notes (Signed)
[facility] DEPART[facility] Provider Note   CSN: [id number] Arrival date & time: 04/16/19  1958     History   Chief Complaint Chief Complaint  Patient presents with  . Diarrhea    HPI Jerry [name] is a 78 y.o. male.     Patient is a 78 year old male who presents with a possible urinary tract infection.  He has a history of diabetes, hypertension, hyperlipidemia and CML.  His son brought him in because he thinks he has a urinary tract infection.  He is having urinary frequency.  Previously when he has had the symptoms he has had urinary tract infections.  He had one loose stool today but no other episodes of diarrhea.  No nausea or vomiting.  No fevers.  He has a little bit of coughing.  No shortness of breath or chest pain.     Past Medical History:  Diagnosis Date  . Diabetes mellitus with neuropathy ([place])   . Hyperlipidemia   . Hypertension   . Leukemia ([place])   . MRSA infection 09/19/2015   left arm  . Urinary retention 01/11/2014    Patient Active Problem List   Diagnosis Date Noted  . Sepsis secondary to UTI ([place]) 07/28/2018  . Uncontrolled diabetes mellitus with diabetic autonomic neuropathy, with long-term current use of insulin ([place]) 07/28/2018  . Acute metabolic encephalopathy 62/69/4854  . Abscess of forearm 09/25/2015  . Hyperlipidemia LDL goal <100 09/25/2015  . Infection with methicillin-resistant Staphylococcus aureus 09/19/2015  . Cellulitis of upper extremity 09/15/2015  . Fall in home 09/15/2015  . Muscle ache 09/15/2015  . Type 2 diabetes mellitus with hyperglycemia ([place]) 09/15/2015  . Avitaminosis D 08/05/2015  . Chronic kidney disease (CKD), stage III (moderate) ([place]) 05/13/2015  . Long term current use of insulin ([place]) 05/13/2015  . Lumbar radiculopathy 04/15/2015  . LBP (low back pain) 11/05/2014  . Excessive falling 09/25/2014  . CAD in native artery 07/26/2014  . Congestive heart failure ([place]) 07/26/2014  . Benign fibroma of  prostate 05/11/2014  . Accumulation of fluid in tissues 11/05/2013  . Essential (primary) hypertension 11/05/2013  . Anemia in chronic illness 10/12/2013  . Dizziness 07/31/2013  . Allergic rhinitis 01/20/2013  . Anxiety state 01/20/2013  . Carpal tunnel syndrome 01/20/2013  . 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 Acid reflux 01/20/2013  . Diabetic polyneuropathy ([place]) 01/20/2013  . Restless leg 01/20/2013  . Detached retina 01/20/2013  . Absence of bladder continence 01/20/2013  . Bladder neck obstruction 11/29/2012    Past Surgical History:  Procedure Laterality Date  . APPENDECTOMY    . BACK SURGERY    . CHOLECYSTECTOMY    . EYE SURGERY    . INCISE AND DRAIN ABCESS Left 09/16/2015   forearm; debscess undere MAC; [facility]; Dr Curley Spice  . INCISION AND DRAINAGE ABSCESS Left 09/19/15   forearm wound washout with pulavac at [facility] by Dr Curley Spice        Home Medications    Prior to Admission medications   Medication Sig Start Date End Date Taking? Authorizing Provider  acetaminophen (TYLENOL) 325 MG tablet Take 325 mg by mouth every 6 (six) hours as needed for mild pain, moderate pain or headache.  01/03/14   [provider]  aspirin EC 81 MG tablet Take 81 mg by mouth every other day.     [provider]  atorvastatin (LIPITOR) 80 MG tablet  Take 80 mg by mouth daily.    [provider]  azelastine (ASTELIN) 0.1 % nasal spray Place 1 spray into both nostrils 2 (two) times daily as needed for rhinitis or allergies.    [provider]  ergocalciferol (VITAMIN D2) 1.25 MG (50000 UT) capsule Take 50,000 Units by mouth every Monday.    [provider]  finasteride (PROSCAR) 5 MG tablet Take 5 mg by mouth daily.  08/02/14   [provider]  fluticasone (FLONASE) 50 MCG/ACT nasal spray Place 2 sprays into both nostrils  daily as needed for allergies.  02/28/15   [provider]  Insulin Glargine (LANTUS SOLOSTAR [place]) Inject 30 Units into the skin every morning.    [provider]  insulin glulisine (APIDRA) 100 UNIT/ML injection Inject 8 Units into the skin 3 (three) times daily before meals.    [provider]  metoprolol succinate (TOPROL-XL) 25 MG 24 hr tablet Take 25 mg by mouth daily.  03/19/15   [provider]  montelukast (SINGULAIR) 10 MG tablet Take 10 mg by mouth at bedtime.    [provider]  nitrofurantoin, macrocrystal-monohydrate, (MACROBID) 100 MG capsule Take 1 capsule (100 mg total) by mouth 2 (two) times daily. X 7 days 04/16/19   Malvin Johns, MD  nitroGLYCERIN (NITROSTAT) 0.4 MG SL tablet Place 1 tablet (0.4 mg total) under the tongue every 5 (five) minutes as needed for chest pain. 08/01/18   Hongalgi, Lenis Dickinson, MD  omeprazole (PRILOSEC) 40 MG capsule Take 40 mg by mouth daily.  02/28/15   [provider]  tamsulosin (FLOMAX) 0.4 MG CAPS capsule Take 0.4 mg by mouth daily.     [provider]    Family History Family History  Problem Relation Age of Onset  . Heart attack Other   . Diabetes Other   . Hypertension Other     Social History Social History   Tobacco Use  . Smoking status: Former Research scientist (life sciences)  . Smokeless tobacco: Never Used  Substance Use Topics  . Alcohol use: No    Alcohol/week: 0.0 standard drinks  . Drug use: No     Allergies   Influenza vaccines, Aspirin, Cyclobenzaprine, Dextromethorphan, Doxycycline, Flu virus vaccine, Gabapentin, Glipizide, Hydrocodone-acetaminophen, Iodinated diagnostic agents, Latex, Lisinopril, Pregabalin, Ropinirole, Salicylates, Sulfamethoxazole-trimethoprim, Oxycodone-acetaminophen, and Penicillins   Review of Systems Review of Systems  Constitutional: Negative for chills, diaphoresis, fatigue and fever.  HENT: Negative for congestion, rhinorrhea and sneezing.   Eyes:  Negative.   Respiratory: Positive for cough. Negative for chest tightness and shortness of breath.   Cardiovascular: Negative for chest pain and leg swelling.  Gastrointestinal: Negative for abdominal pain, blood in stool, diarrhea, nausea and vomiting.  Genitourinary: Positive for frequency. Negative for difficulty urinating, flank pain and hematuria.  Musculoskeletal: Negative for arthralgias and back pain.  Skin: Negative for rash.  Neurological: Negative for dizziness, speech difficulty, weakness, numbness and headaches.     Physical Exam Updated Vital Signs BP 135/83 (BP Location: Right Arm)   Pulse 87   Temp 98.8 F (37.1 C) (Oral)   Resp 20   SpO2 100%   Physical Exam Constitutional:      Appearance: He is well-developed.  HENT:     Head: Normocephalic and atraumatic.  Eyes:     Pupils: Pupils are equal, round, and reactive to light.  Neck:     Musculoskeletal: Normal range of motion and neck supple.  Cardiovascular:     Rate and Rhythm: Normal rate  and regular rhythm.     Heart sounds: Normal heart sounds.  Pulmonary:     Effort: Pulmonary effort is normal. No respiratory distress.     Breath sounds: Normal breath sounds. No wheezing or rales.  Chest:     Chest wall: No tenderness.  Abdominal:     General: Bowel sounds are normal.     Palpations: Abdomen is soft.     Tenderness: There is no abdominal tenderness. There is no guarding or rebound.  Genitourinary:    Comments: Normal male external genitalia, no tenderness in the testicles or inguinal canals, no scrotal swelling Musculoskeletal: Normal range of motion.  Lymphadenopathy:     Cervical: No cervical adenopathy.  Skin:    General: Skin is warm and dry.     Findings: No rash.  Neurological:     Mental Status: He is alert and oriented to person, place, and time.      ED Treatments / Results  Labs (all labs ordered are listed, but only abnormal results are displayed) Labs Reviewed  CBC WITH  DIFFERENTIAL/PLATELET - Abnormal; Notable for the following components:      Result Value   RBC 4.07 (*)    Hemoglobin 11.2 (*)    HCT 33.4 (*)    All other components within normal limits  COMPREHENSIVE METABOLIC PANEL - Abnormal; Notable for the following components:   Potassium 3.3 (*)    Glucose, Bld 171 (*)    All other components within normal limits  URINALYSIS, ROUTINE W REFLEX MICROSCOPIC - Abnormal; Notable for the following components:   Glucose, UA 100 (*)    Hgb urine dipstick LARGE (*)    Protein, ur 100 (*)    Nitrite POSITIVE (*)    Leukocytes,Ua MODERATE (*)    All other components within normal limits  URINALYSIS, MICROSCOPIC (REFLEX) - Abnormal; Notable for the following components:   Bacteria, UA MANY (*)    All other components within normal limits  URINE CULTURE    EKG None  Radiology No results found.  Procedures Procedures (including critical care time)  Medications Ordered in ED Medications  nitrofurantoin (macrocrystal-monohydrate) (MACROBID) capsule 100 mg (100 mg Oral Given 04/16/19 2157)     Initial Impression / Assessment and Plan / ED Course  I have reviewed the triage vital signs and the nursing notes.  Pertinent labs & imaging results that were available during my care of the patient were reviewed by me and considered in my medical decision making (see chart for details).        Patient is a 78 year old who presents with urinary frequency.  He has symptoms consistent with his prior UTIs.  His urine is consistent with a urinary tract infection.  His other labs are non-concerning.  He has normal kidney function.  He is status baseline status otherwise.  His vital signs are stable.  He does not have any suggestions of systemic illness or sepsis.  He was discharged home in good condition.  He was started on Macrobid due to his multiple other drug allergies.  His urine was sent for culture.  He has a follow-up appointment with urology on  September 10.  I advised to follow-up sooner or return to the emergency department if he has any ongoing or worsening symptoms.  Final Clinical Impressions(s) / ED Diagnoses   Final diagnoses:  Acute cystitis without hematuria    ED Discharge Orders         Ordered    nitrofurantoin, macrocrystal-monohydrate, (  MACROBID) 100 MG capsule  2 times daily     04/16/19 2201           Malvin Johns, MD 04/16/19 2206

## 2019-04-16 NOTE — ED Triage Notes (Signed)
Pt brought to triage via w/c by son-son states he is going to the car and he does not have a contact ph# due to no minutes left on phone-pt c/o "infection down there" with one episode of diarrhea and "feeling dizzy after my bowels moved"-NAD

## 2019-04-18 LAB — URINE CULTURE: Culture: 60000 — AB

## 2019-04-22 ENCOUNTER — Telehealth: Payer: Self-pay | Admitting: Internal Medicine

## 2019-04-22 NOTE — Telephone Encounter (Signed)
78 yo male with h/o CVA, UTI, CAD, CHF, DM admitted to [facility] for rehab.  Was in [facility] ED on 8/31 for UTI and treated with macrodantin.  Sounds like he was then hospitalized outside of [place] for stroke and UTI, but oddly, I cannot find anything in careeverywhere for admission, only some notes from urology at [facility].  He is on plavix thru 10/3, ASA 81mg  qod while on plavix, then to be on asa 325mg  after plavix completed.  He's also newly on levaquin 500mg  daily for 3 more days and magnesium for 30 days, plus his regular meds.  CBGs ac and hs ordered for him.  Will need admission H&P.    Charle Clear L. Glyn Gerads, D.O. [facility] [address], [place] [address] Cell Phone (Mon-Fri 8am-5pm):  [phone] On Call:  [phone] & follow prompts after 5pm & weekends Office Phone:  [phone] Office Fax:  [phone]

## 2019-04-23 ENCOUNTER — Encounter: Payer: Self-pay | Admitting: Internal Medicine

## 2019-04-23 ENCOUNTER — Non-Acute Institutional Stay (SKILLED_NURSING_FACILITY): Payer: Medicare Other | Admitting: Internal Medicine

## 2019-04-23 DIAGNOSIS — N390 Urinary tract infection, site not specified: Secondary | ICD-10-CM | POA: Diagnosis not present

## 2019-04-23 DIAGNOSIS — I639 Cerebral infarction, unspecified: Secondary | ICD-10-CM | POA: Insufficient documentation

## 2019-04-23 DIAGNOSIS — C9210 Chronic myeloid leukemia, BCR/ABL-positive, not having achieved remission: Secondary | ICD-10-CM | POA: Diagnosis not present

## 2019-04-23 NOTE — Patient Instructions (Signed)
See assessment and plan under each diagnosis in the problem list and acutely for this visit 

## 2019-04-23 NOTE — Progress Notes (Signed)
NURSING HOME LOCATION:  [facility] ROOM NUMBER:  311-A  CODE STATUS:  DNR  PCP:  Beckie Salts, MD    This is a comprehensive admission note to [facility] performed on this date less than 30 days from date of admission. Included are preadmission medical/surgical history; reconciled medication list; family history; social history and comprehensive review of systems.  Corrections and additions to the records were documented. Comprehensive physical exam was also performed. Additionally a clinical summary was entered for each active diagnosis pertinent to this admission in the Problem List to enhance continuity of care.  HPI: The patient is a 78 year old male admitted to the SNF for rehab.   He was seen  04/16/2019 in the [facility] ED for dysuria.  Urinalysis was abnormal but subsequent culture revealed no growth.  He was treated for UTI with Macrodantin at that time. He was hospitalized  at [facility] which is now in the [facility] system for acute stroke  9/1-04/21/2019.  He states that he had pain in the right upper extremity and could not elevate it and had fallen several days prior to the ED visit.    CNS CT revealed no acute abnormalities.  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  Neurology consultant recommended 81 mg aspirin every other day and Plavix 75 mg daily for 30 days through 10/3 with 325 mg of aspirin daily thereafter.  Because of dysphagia ground/nectar thick liquids recommended.  During the hospitalization he was found to have elevated troponins which trended down.  This was thought to be demand ischemia in nature related to the stroke.  There was no evidence of ACS.  There was slight ST depression in the lateral leads.   Apparently had diarrhea while hospitalized; C. difficile studies were  negative. He is on both tamsulosin as well as finasteride in the context of a history of urinary retention. The allergy list does include aspirin as possibly been a factor with an episode of dizziness and unconsciousness.  Of note there are 17 agents or medicines listed among his allergies. At the time of  SNF admission 9/5 he was to complete 3 additional days of Levaquin.  Hypomagnesemia was documented and magnesium supplementation was ordered for 30 days.  Past medical and surgical history: Includes diabetes complicated by neuropathy, GERD, dyslipidemia, essential hypertension, history of CML, CKD stage III, history of CHF and history of MRSA in 2017 of the LUE.  Problem list includes diagnoses of benign fibroma of the prostate and bladder neck obstruction. Surgeries include cholecystectomy, I&D of an abscess of the left forearm, back surgery for lumbar radiculopathy, and appendectomy.  Social history: Nondrinker; former smoker.  Family history: Reviewed   Review of systems: He is hard of hearing and has difficulty with word retrieval.  It took him a short while to remember the diagnosis "diabetes".  He did give the date correctly but the response was delayed and initially he said "2000" then corrected this to 2020.   He describes occasional headaches.  He still denies dysphagia although he states this had been diagnosed at the other hospital.  He also describes occasional abdominal pain.  He has sharp pain below both knees extending to the feet.  He describes intolerance to cold. He states that he has "cancer" meaning CML.  He states that he is due for apparent  follow-up blood work on 9/10.  Constitutional: No fever, significant weight change, fatigue  Eyes: No redness, discharge, pain, vision change ENT/mouth: No nasal congestion, purulent discharge, earache, change in hearing, sore throat  Cardiovascular: No current chest pain, palpitations, paroxysmal nocturnal dyspnea, claudication, edema   Respiratory: No cough, sputum production, hemoptysis, DOE, significant snoring, apnea Gastrointestinal: No heartburn, nausea /vomiting, rectal bleeding, melena, change in bowels Genitourinary: No dysuria, hematuria, pyuria, incontinence, nocturia Musculoskeletal: No joint stiffness, joint swelling, RUE weakness Dermatologic: No rash, pruritus, change in appearance of skin Neurologic: No dizziness,  syncope, seizures, numbness, tingling Psychiatric: No significant anxiety, depression, insomnia, anorexia Endocrine: No change in hair/skin/nails, excessive thirst, excessive hunger, excessive urination  Hematologic/lymphatic: No significant bruising, lymphadenopathy, abnormal bleeding Allergy/immunology: No itchy/watery eyes, significant sneezing, urticaria, angioedema  Physical exam:  Pertinent or positive findings: Pattern alopecia is present.  As noted he is hard of hearing.  He describes slight tenderness to palpation over the mid abdomen.  Pedal pulses were decreased.  There is insignificant trace edema at the sock line.  He is weak to opposition in all extremities.  He has fusiform changes in the PIP joints.  There is vitiliginous scarring noted over the right upper extremity.  General appearance: Adequately nourished; no acute distress, increased work of breathing is present.   Lymphatic: No lymphadenopathy about the head, neck, axilla. Eyes: No conjunctival inflammation or lid edema is present. There is no scleral icterus. Ears:  External ear exam shows no significant lesions or deformities.   Nose:  External nasal examination shows no deformity or inflammation. Nasal mucosa are pink and moist without lesions, exudates Oral exam: Lips and gums are healthy appearing.There is no oropharyngeal erythema or exudate. Neck:  No thyromegaly, masses, tenderness noted.  Heart:  Normal rate and regular rhythm. S1 and S2 normal without gallop, murmur, click, rub.  Lungs: Chest clear to auscultation  without wheezes, rhonchi, rales, rubs. Abdomen: Bowel sounds are normal.  Abdomen is soft  with no organomegaly, hernias, masses. GU: Deferred  Extremities:  No cyanosis, clubbing. Neurologic exam:  Balance, Rhomberg, finger to nose testing could not be completed due to clinical state Skin: Warm & dry w/o tenting. No significant rash.  See clinical summary under each active problem in the Problem List with associated updated therapeutic plan

## 2019-04-23 NOTE — Assessment & Plan Note (Signed)
Complete 3 days of Levaquin

## 2019-04-23 NOTE — Assessment & Plan Note (Addendum)
04/21/2019 white blood count 7000, hemoglobin/hematocrit 10.9/30.8, platelet count 222,000 Reschedule any hematologic follow-up  because of the COVID quarantine.

## 2019-04-23 NOTE — Assessment & Plan Note (Addendum)
Dual therapy until 10/4 and then full dose aspirin daily thereafter Establish protective blood pressure goal.

## 2019-04-24 ENCOUNTER — Encounter: Payer: Self-pay | Admitting: Internal Medicine

## 2019-04-26 ENCOUNTER — Encounter: Payer: Self-pay | Admitting: Adult Health

## 2019-04-26 ENCOUNTER — Non-Acute Institutional Stay (SKILLED_NURSING_FACILITY): Payer: Medicare Other | Admitting: Adult Health

## 2019-04-26 DIAGNOSIS — R1310 Dysphagia, unspecified: Secondary | ICD-10-CM

## 2019-04-26 DIAGNOSIS — I509 Heart failure, unspecified: Secondary | ICD-10-CM | POA: Diagnosis not present

## 2019-04-26 DIAGNOSIS — I639 Cerebral infarction, unspecified: Secondary | ICD-10-CM

## 2019-04-26 DIAGNOSIS — Z7189 Other specified counseling: Secondary | ICD-10-CM

## 2019-04-26 DIAGNOSIS — I10 Essential (primary) hypertension: Secondary | ICD-10-CM

## 2019-04-26 DIAGNOSIS — Z794 Long term (current) use of insulin: Secondary | ICD-10-CM

## 2019-04-26 DIAGNOSIS — N400 Enlarged prostate without lower urinary tract symptoms: Secondary | ICD-10-CM | POA: Diagnosis not present

## 2019-04-26 DIAGNOSIS — E1165 Type 2 diabetes mellitus with hyperglycemia: Secondary | ICD-10-CM | POA: Diagnosis not present

## 2019-04-26 DIAGNOSIS — C9210 Chronic myeloid leukemia, BCR/ABL-positive, not having achieved remission: Secondary | ICD-10-CM

## 2019-04-26 NOTE — Progress Notes (Signed)
Location:  [facility] Room Number: 311/A Place of Service:  SNF (31) Provider:  Durenda Age, DNP, FNP-BC  Patient Care Team: Beckie Salts, MD as PCP - General (Internal Medicine)  Extended Emergency Contact Information Primary Emergency Contact: Boline,[name] Address: [address], [place] [address] [name] of [place] Phone: [phone] Mobile Phone: [phone] Relation: Son Secondary Emergency Contact: [name] [place] of [place] Mobile Phone: [phone] Relation: Other  Code Status: DNR   Goals of care: Advanced Directive information Advanced Directives 04/26/2019  Does Patient Have a Medical Advance Directive? Yes  Type of Advance Directive Out of facility DNR (pink MOST or yellow form)  Does patient want to make changes to medical advance directive? No - Patient declined  Would patient like information on creating a medical advance directive? -  Pre-existing out of facility DNR order (yellow form or pink MOST form) -     Chief Complaint  Patient presents with   Advanced Directive    Care Plan w/ Family     HPI:  Pt is a 78 y.o. male seen today for a telephone conference with son/daughter-in-law.  He has been admitted to [facility] on 04/21/19 from hospitalization 04/17/19 to 04/21/19 for CVA. He presented to the hospital with right arm pain and weakness in addition to a fall that happened a few days before hospitalization. He complained of dysuria and was found to have UTI and Levofloxacin was started. Brain CT showed no abnormality. Brain MRI showed small acute infarct right body of corpus collosum and adjacent frontal lobe. Echo with bubble study negative, carotid dopplers 40-59% stenosis bilateral ICA, transcranial ultrasound right intracranial velocity decreased, left not visualized, basilar artery velocity normal. Neurology was consulted and recommended aspirin 81 mg and  Plavix 75 mg daily for 30 days, then asa 325 mg daily. Lipitor 80 mg daily  Resident was seen in the room. He complained about the food texture. His current diet is mechanical soft diet. He has no teeth and wants his food pureed. Bilateral grips are strong. He is alert and oriented X 3. Called son, [name],on the phone but did not answer. Called daughter-in-law, Langley Gauss, and updated her with Colman's current condition. She said that [place] has no dentures at home. Discussed medications and vital signs. Informed her that they can schedule a window visit. She was happy about the window visit. Son has brought in Sprycel medication according to her. The conference call lasted for 20 minutes.   Past Medical History:  Diagnosis Date   Diabetes mellitus with neuropathy ([place])    Hyperlipidemia    Hypertension    Leukemia ([place])    MRSA infection 09/19/2015   left arm   Urinary retention 01/11/2014   Past Surgical History:  Procedure Laterality Date   APPENDECTOMY     BACK SURGERY     CHOLECYSTECTOMY     EYE SURGERY     INCISE AND DRAIN ABCESS Left 09/16/2015   forearm; debscess undere MAC; [facility]; Dr Curley Spice   INCISION AND DRAINAGE ABSCESS Left 09/19/15   forearm wound washout with pulavac at [facility] by Dr Curley Spice    Allergies  Allergen Reactions   Influenza Vaccines Hives   Aspirin Other (See Comments)    DIZZINESS, UNCONSCIOUSNESS 09/16/15 pt takes 81 mg aspirin at home.[name], RPh   Cyclobenzaprine Hives   Dextromethorphan Other (See Comments)    Unknown  Doxycycline Nausea Only   Flu Virus Vaccine    Gabapentin Other (See Comments)    GI UPSET,DROWSINESS   Glipizide Other (See Comments)    DIZZINESS   Hydrocodone-Acetaminophen Other (See Comments)    Unknown    Iodinated Diagnostic Agents Other (See Comments)    Unknown  Unknown    Latex Other (See Comments)    Unknown    Lisinopril Other (See Comments)    COUGH   Pregabalin Other  (See Comments)    GI UPSET,DROWSINESS   Ropinirole Nausea Only   Salicylates Other (See Comments)    Unknown    Sulfamethoxazole-Trimethoprim Nausea Only   Oxycodone-Acetaminophen Hives and Nausea Only   Penicillins Hives, Itching and Rash    Has patient had a PCN reaction causing immediate rash, facial/tongue/throat swelling, SOB or lightheadedness with hypotension: Yes Has patient had a PCN reaction causing severe rash involving mucus membranes or skin necrosis: No Has patient had a PCN reaction that required hospitalization: No Has patient had a PCN reaction occurring within the last 10 years: No If all of the above answers are "NO", then may proceed with Cephalosporin use.     Outpatient Encounter Medications as of 04/26/2019  Medication Sig   acetaminophen (TYLENOL) 325 MG tablet Take 325 mg by mouth every 6 (six) hours as needed for mild pain, moderate pain or headache.    aspirin EC 81 MG tablet Take 81 mg by mouth every other day.    atorvastatin (LIPITOR) 80 MG tablet Take 80 mg by mouth daily.   azelastine (ASTELIN) 0.1 % nasal spray Place 1 spray into both nostrils 2 (two) times daily as needed for rhinitis or allergies.   bisacodyl (DULCOLAX) 10 MG suppository If not relieved by MOM, give 10 mg Bisacodyl suppositiory rectally X 1 dose in 24 hours as needed (Do not use constipation standing orders for residents with renal failure/CFR less than 30. Contact MD for orders) (Physician Order)   clopidogrel (PLAVIX) 75 MG tablet Take 75 mg by mouth daily. FOR CVA FOR 4 WEEKS   dasatinib (SPRYCEL) 20 MG tablet Take 50 mg by mouth daily. Take 2.5 tablets to = 50 mg   finasteride (PROSCAR) 5 MG tablet Take 5 mg by mouth daily.    insulin lispro (HUMALOG) 100 UNIT/ML KwikPen Junior INJECT 8 UNITS SUBCUTANEOUSLY AFTER EACH MEAL FOR DM (PRIME PEN WITH 2 UNITS PRIOR TO EACH USE - PEN EXPIRES 28 DAYS AFTER OPENING) (USE SAF   losartan (COZAAR) 25 MG tablet Take 25 mg by mouth  daily.   magnesium hydroxide (MILK OF MAGNESIA) 400 MG/5ML suspension If no BM in 3 days, give 30 cc Milk of Magnesium p.o. x 1 dose in 24 hours as needed (Do not use standing constipation orders for residents with renal failure CFR less than 30. Contact MD for orders) (Physician Order)   magnesium oxide (MAG-OX) 400 MG tablet Take 400 mg by mouth 2 (two) times daily. x1 month   metoprolol succinate (TOPROL-XL) 25 MG 24 hr tablet Take 25 mg by mouth daily.    NON FORMULARY Diet: Mechanical soft with thin liquids   Sodium Phosphates (RA SALINE ENEMA RE) If not relieved by Biscodyl suppository, give disposable Saline Enema rectally X 1 dose/24 hrs as needed (Do not use constipation standing orders for residents with renal failure/CFR less than 30. Contact MD for orders)(Physician Or   tamsulosin (FLOMAX) 0.4 MG CAPS capsule Take 0.4 mg by mouth daily.    No facility-administered encounter medications  on file as of 04/26/2019.     Review of Systems  GENERAL: No change in appetite, no fatigue, no weight changes, no fever, chills or weakness MOUTH and THROAT: Denies oral discomfort, gingival pain or bleeding RESPIRATORY: no cough, SOB, DOE, wheezing, hemoptysis CARDIAC: No chest pain, edema or palpitations GI: No abdominal pain, diarrhea, constipation, heart burn, nausea or vomiting GU: Denies dysuria, frequency, hematuria, incontinence, or discharge NEUROLOGICAL: Denies dizziness, syncope, numbness, or headache PSYCHIATRIC: Denies feelings of depression or anxiety. No report of hallucinations, insomnia, paranoia, or agitation   Immunization History  Administered Date(s) Administered   Influenza, Seasonal, Injecte, Preservative Fre 08/21/2012   Influenza-Unspecified 08/21/2012   Pneumococcal Conjugate-13 02/28/2015, 11/25/2015   Pneumococcal Polysaccharide-23 08/26/2016   Pertinent  Health Maintenance Due  Topic Date Due   INFLUENZA VACCINE  05/26/2019 (Originally 03/17/2019)    FOOT EXAM  05/26/2019 (Originally 08/14/1951)   HEMOGLOBIN A1C  05/26/2019 (Originally 01/27/2019)   OPHTHALMOLOGY EXAM  05/26/2019 (Originally 08/14/1951)   COLONOSCOPY  05/26/2019 (Originally 08/14/1993)   PNA vac Low Risk Adult  Completed   No flowsheet data found.   Vitals:   04/26/19 1246  BP: (!) 118/40  Pulse: (!) 44  Resp: 20  Temp: 98.6 F (37 C)  TempSrc: Oral  SpO2: 100%  Weight: 161 lb 3.2 oz (73.1 kg)  Height: _0  (1.854 m)   Body mass index is 21.27 kg/m.  Physical Exam  GENERAL APPEARANCE: Well nourished. In no acute distress. Normal body habitus SKIN:  Skin is warm and dry.  MOUTH and THROAT: Lips are without lesions. Oral mucosa is moist and without lesions. Tongue is normal in shape, size, and color and without lesions RESPIRATORY: Breathing is even & unlabored, BS CTAB CARDIAC: RRR, no murmur,no extra heart sounds, no edema GI: Abdomen soft, normal BS, no masses, no tenderness EXTREMITIES:  Able to move X4 extremities NEUROLOGICAL: There is no tremor. Speech is clear.Alert and oriented X 3  PSYCHIATRIC: . Affect and behavior are appropriate  Labs reviewed: Recent Labs    07/28/18 1430  07/30/18 0620 10/09/18 0328 04/16/19 2027  NA  --    < > 135 132* 137  K  --    < > 4.4 4.5 3.3*  CL  --    < > 104 101 104  CO2  --    < > _1 GLUCOSE  --    < > 334* 353* 171*  BUN  --    < > 26* 26* 17  CREATININE 1.18   < > 1.02 1.01 0.96  CALCIUM  --    < > 8.2* 8.8* 9.0  MG 2.1  --   --   --   --    < > = values in this interval not displayed.   Recent Labs    07/28/18 0907 04/16/19 2027  AST 32 19  ALT 22 14  ALKPHOS 101 71  BILITOT 0.9 0.5  PROT 8.2* 7.6  ALBUMIN 4.4 3.7   Recent Labs    07/28/18 0907  08/01/18 0623 10/09/18 0328 04/16/19 2027  WBC 13.7*   < > 5.0 6.0 7.6  NEUTROABS 9.0*  --   --  3.7 4.4  HGB 12.6*   < > 9.6* 10.5* 11.2*  HCT 37.3*   < > 30.2* 31.4* 33.4*  MCV 83.6   < > 88.3 83.3 82.1  PLT 275   < > 146*  182 211   < > = values in  this interval not displayed.   Lab Results  Component Value Date   TSH 2.322 07/28/2018   Lab Results  Component Value Date   HGBA1C 13.2 (H) 07/28/2018    Assessment/Plan  1. Cerebrovascular accident (CVA), unspecified mechanism ([place]) - continue Plavix 75 mg daily and aspirin 81 mg daily for 30 days then aspirin 325 mg daily, continue Lipitor 80 mg daily, for PT and OT for therapeutic strengthening exercises, fall precautions  2. Type 2 diabetes mellitus with hyperglycemia, with long-term current use of insulin (HCC) Lab Results  Component Value Date   HGBA1C 13.2 (H) 07/28/2018  -Continue insulin lispro 100 unit/mL inject 8 units subcutaneously 3 times a day, Toujeo 300 unit/mL inject 30 units subcutaneously at bedtime, CBG before meals and at bedtime  3. Benign fibroma of prostate -Continue finasteride 5 mg 1 tab daily and tamsulosin 0.4 mg 1 capsule daily  4. Congestive heart failure, unspecified HF chronicity, unspecified heart failure type (HCC) -No SOB, ejection fraction is estimated at 55 to 60%, continue metoprolol succinate ER 25 mg 1 tab daily  5. Essential (primary) hypertension -Continue losartan 25 mg 1 tab daily, metoprolol succinate ER 25 mg 1 tab daily  6. Chronic myeloid leukemia (HCC) -Continue Sprycel 50 mg daily  7.  Dysphagia -ST for speech and swallow therapy, change diet to pure with thin liquids  8. Advance care planning -Current conditions, medications, diet, vital signs, code status DNR    Family/ staff Communication:  Discussed plan of care with resident, charge nurse and daugter-in-law.  Labs/tests ordered:  None  Goals of care:   Short-term care   Durenda Age, DNP, FNP-BC [facility] [phone] (Monday-Friday 8:00 a.m. - 5:00 p.m.) [phone] (after hours)

## 2019-04-26 NOTE — Progress Notes (Deleted)
Location:  [facility] Room Number: 311/A Place of Service:  SNF (31) Provider:  Durenda Age, DNP, FNP-BC  Patient Care Team: Beckie Salts, MD as PCP - General (Internal Medicine)  Extended Emergency Contact Information Primary Emergency Contact: Casciano,[name] Address: [place], [place] [address] [name] of [place] Phone: [phone] Mobile Phone: [phone] Relation: Son Secondary Emergency Contact: [name] [place] of [place] Mobile Phone: [phone] Relation: Other  Code Status:  DNR  Goals of care: Advanced Directive information Advanced Directives 04/26/2019  Does Patient Have a Medical Advance Directive? Yes  Type of Advance Directive Out of facility DNR (pink MOST or yellow form)  Does patient want to make changes to medical advance directive? No - Patient declined  Would patient like information on creating a medical advance directive? -  Pre-existing out of facility DNR order (yellow form or pink MOST form) -     Chief Complaint  Patient presents with  . Acute Visit    Care Plan w/Family, Blood Sugar 185 mg/dl    HPI:  Pt is a 78 y.o. male seen today for medical management of chronic diseases.     Past Medical History:  Diagnosis Date  . Diabetes mellitus with neuropathy ([place])   . Hyperlipidemia   . Hypertension   . Leukemia ([place])   . MRSA infection 09/19/2015   left arm  . Urinary retention 01/11/2014   Past Surgical History:  Procedure Laterality Date  . APPENDECTOMY    . BACK SURGERY    . CHOLECYSTECTOMY    . EYE SURGERY    . INCISE AND DRAIN ABCESS Left 09/16/2015   forearm; debscess undere MAC; [facility]; Dr Curley Spice  . INCISION AND DRAINAGE ABSCESS Left 09/19/15   forearm wound washout with pulavac at [facility] by Dr Curley Spice    Allergies  Allergen Reactions  . Influenza Vaccines Hives  . Aspirin Other (See Comments)    DIZZINESS, UNCONSCIOUSNESS 09/16/15  pt takes 81 mg aspirin at home.[name], RPh  . Cyclobenzaprine Hives  . Dextromethorphan Other (See Comments)    Unknown   . Doxycycline Nausea Only  . Flu Virus Vaccine   . Gabapentin Other (See Comments)    GI UPSET,DROWSINESS  . Glipizide Other (See Comments)    DIZZINESS  . Hydrocodone-Acetaminophen Other (See Comments)    Unknown   . Iodinated Diagnostic Agents Other (See Comments)    Unknown  Unknown   . Latex Other (See Comments)    Unknown   . Lisinopril Other (See Comments)    COUGH  . Pregabalin Other (See Comments)    GI UPSET,DROWSINESS  . Ropinirole Nausea Only  . Salicylates Other (See Comments)    Unknown   . Sulfamethoxazole-Trimethoprim Nausea Only  . Oxycodone-Acetaminophen Hives and Nausea Only  . Penicillins Hives, Itching and Rash    Has patient had a PCN reaction causing immediate rash, facial/tongue/throat swelling, SOB or lightheadedness with hypotension: Yes Has patient had a PCN reaction causing severe rash involving mucus membranes or skin necrosis: No Has patient had a PCN reaction that required hospitalization: No Has patient had a PCN reaction occurring within the last 10 years: No If all of the above answers are "NO", then may proceed with Cephalosporin use.     Outpatient Encounter Medications as of 04/26/2019  Medication Sig  . acetaminophen (TYLENOL) 325 MG tablet Take 325 mg by mouth every 6 (six) hours  as needed for mild pain, moderate pain or headache.   Marland Kitchen aspirin EC 81 MG tablet Take 81 mg by mouth every other day.   Marland Kitchen atorvastatin (LIPITOR) 80 MG tablet Take 80 mg by mouth daily.  Marland Kitchen azelastine (ASTELIN) 0.1 % nasal spray Place 1 spray into both nostrils 2 (two) times daily as needed for rhinitis or allergies.  . bisacodyl (DULCOLAX) 10 MG suppository If not relieved by MOM, give 10 mg Bisacodyl suppositiory rectally X 1 dose in 24 hours as needed (Do not use constipation standing orders for residents with renal failure/CFR less  than 30. Contact MD for orders) (Physician Order)  . clopidogrel (PLAVIX) 75 MG tablet Take 75 mg by mouth daily. FOR CVA FOR 4 WEEKS  . dasatinib (SPRYCEL) 20 MG tablet Take 50 mg by mouth daily. Take 2.5 tablets to = 50 mg  . finasteride (PROSCAR) 5 MG tablet Take 5 mg by mouth daily.   . insulin lispro (HUMALOG) 100 UNIT/ML KwikPen Junior INJECT 8 UNITS SUBCUTANEOUSLY AFTER EACH MEAL FOR DM (PRIME PEN WITH 2 UNITS PRIOR TO EACH USE - PEN EXPIRES 28 DAYS AFTER OPENING) (USE SAF  . losartan (COZAAR) 25 MG tablet Take 25 mg by mouth daily.  . magnesium hydroxide (MILK OF MAGNESIA) 400 MG/5ML suspension If no BM in 3 days, give 30 cc Milk of Magnesium p.o. x 1 dose in 24 hours as needed (Do not use standing constipation orders for residents with renal failure CFR less than 30. Contact MD for orders) (Physician Order)  . magnesium oxide (MAG-OX) 400 MG tablet Take 400 mg by mouth 2 (two) times daily. x1 month  . metoprolol succinate (TOPROL-XL) 25 MG 24 hr tablet Take 25 mg by mouth daily.   . NON FORMULARY Diet: Mechanical soft with thin liquids  . Sodium Phosphates (RA SALINE ENEMA RE) If not relieved by Biscodyl suppository, give disposable Saline Enema rectally X 1 dose/24 hrs as needed (Do not use constipation standing orders for residents with renal failure/CFR less than 30. Contact MD for orders)(Physician Or  . tamsulosin (FLOMAX) 0.4 MG CAPS capsule Take 0.4 mg by mouth daily.   . [DISCONTINUED] clopidogrel (PLAVIX) 75 MG tablet Take 75 mg by mouth daily. x4 weeks  . [DISCONTINUED] ergocalciferol (VITAMIN D2) 1.25 MG (50000 UT) capsule Take 50,000 Units by mouth every Monday.  . [DISCONTINUED] Insulin Glargine, 1 Unit Dial, (TOUJEO SOLOSTAR) 300 UNIT/ML SOPN Inject 30 Units into the skin at bedtime. Toujeo Solostart  . [DISCONTINUED] insulin glulisine (APIDRA) 100 UNIT/ML injection Inject 8 Units into the skin 3 (three) times daily before meals.   No facility-administered encounter  medications on file as of 04/26/2019.     Review of Systems  GENERAL: No change in appetite, no fatigue, no weight changes, no fever, chills or weakness SKIN: Denies rash, itching, wounds, ulcer sores, or nail abnormalities EYES: Denies change in vision, dry eyes, eye pain, itching or discharge EARS: Denies change in hearing, ringing in ears, or earache NOSE: Denies nasal congestion or epistaxis MOUTH and THROAT: Denies oral discomfort, gingival pain or bleeding, pain from teeth or hoarseness   RESPIRATORY: no cough, SOB, DOE, wheezing, hemoptysis CARDIAC: No chest pain, edema or palpitations GI: No abdominal pain, diarrhea, constipation, heart burn, nausea or vomiting GU: Denies dysuria, frequency, hematuria, incontinence, or discharge MUSCULOSKELETAL: Denies joint pain, muscle pain, back pain, restricted movement, or unusual weakness CIRCULATION: Denies claudication, edema of legs, varicosities, or cold extremities NEUROLOGICAL: Denies dizziness, syncope, numbness, or headache PSYCHIATRIC:  Denies feelings of depression or anxiety. No report of hallucinations, insomnia, paranoia, or agitation ENDOCRINE: Denies polyphagia, polyuria, polydipsia, heat or cold intolerance HEME/LYMPH: Denies excessive bruising, petechia, enlarged lymph nodes, or bleeding problems IMMUNOLOGIC: Denies history of frequent infections, AIDS, or use of immunosuppressive agents   Immunization History  Administered Date(s) Administered  . Influenza, Seasonal, Injecte, Preservative Fre 08/21/2012  . Influenza-Unspecified 08/21/2012  . Pneumococcal Conjugate-13 02/28/2015, 11/25/2015  . Pneumococcal Polysaccharide-23 08/26/2016   Pertinent  Health Maintenance Due  Topic Date Due  . INFLUENZA VACCINE  05/26/2019 (Originally 03/17/2019)  . FOOT EXAM  05/26/2019 (Originally 08/14/1951)  . HEMOGLOBIN A1C  05/26/2019 (Originally 01/27/2019)  . OPHTHALMOLOGY EXAM  05/26/2019 (Originally 08/14/1951)  . COLONOSCOPY   05/26/2019 (Originally 08/14/1993)  . PNA vac Low Risk Adult  Completed   No flowsheet data found.   Vitals:   04/26/19 1144  BP: (!) 118/40  Pulse: (!) 44  Resp: 20  Temp: 98.6 F (37 C)  TempSrc: Oral  SpO2: 100%  Weight: 161 lb 3.2 oz (73.1 kg)  Height: _0  (1.854 m)   Body mass index is 21.27 kg/m.  Physical Exam  GENERAL APPEARANCE: Well nourished. In no acute distress. Normal body habitus SKIN:  Skin is warm and dry. There are no suspicious lesions or rash HEAD: Normal in size and contour. No evidence of trauma EYES: Lids open and close normally. No blepharitis, entropion or ectropion. PERRL. Conjunctivae are clear and sclerae are white. Lenses are without opacity EARS: Pinnae are normal. Patient hears normal voice tunes of the examiner MOUTH and THROAT: Lips are without lesions. Oral mucosa is moist and without lesions. Tongue is normal in shape, size, and color and without lesions NECK: supple, trachea midline, no neck masses, no thyroid tenderness, no thyromegaly LYMPHATICS: No LAN in the neck, no supraclavicular LAN RESPIRATORY: Breathing is even & unlabored, BS CTAB CARDIAC: RRR, no murmur,no extra heart sounds, no edema GI: Abdomen soft, normal BS, no masses, no tenderness, no hepatomegaly, no splenomegaly MUSCULOSKELETAL: No deformities. Movement at each extremity is full and painless. Strength is 5/5 at each extremity. Back is without kyphosis or scoliosis CIRCULATION: Pedal pulses are 2+. There is no edema of the legs, ankles and feet NEUROLOGICAL: There is no tremor. Speech is clear PSYCHIATRIC: Alert and oriented X 3. Affect and behavior are appropriate  Labs reviewed: Recent Labs    07/28/18 1430  07/30/18 0620 10/09/18 0328 04/16/19 2027  NA  --    < > 135 132* 137  K  --    < > 4.4 4.5 3.3*  CL  --    < > 104 101 104  CO2  --    < > _1 GLUCOSE  --    < > 334* 353* 171*  BUN  --    < > 26* 26* 17  CREATININE 1.18   < > 1.02 1.01 0.96   CALCIUM  --    < > 8.2* 8.8* 9.0  MG 2.1  --   --   --   --    < > = values in this interval not displayed.   Recent Labs    07/28/18 0907 04/16/19 2027  AST 32 19  ALT 22 14  ALKPHOS 101 71  BILITOT 0.9 0.5  PROT 8.2* 7.6  ALBUMIN 4.4 3.7   Recent Labs    07/28/18 0907  08/01/18 0623 10/09/18 0328 04/16/19 2027  WBC 13.7*   < > 5.0 6.0  7.6  NEUTROABS 9.0*  --   --  3.7 4.4  HGB 12.6*   < > 9.6* 10.5* 11.2*  HCT 37.3*   < > 30.2* 31.4* 33.4*  MCV 83.6   < > 88.3 83.3 82.1  PLT 275   < > 146* 182 211   < > = values in this interval not displayed.   Lab Results  Component Value Date   TSH 2.322 07/28/2018   Lab Results  Component Value Date   HGBA1C 13.2 (H) 07/28/2018   No results found for: CHOL, HDL, LDLCALC, LDLDIRECT, TRIG, CHOLHDL  Significant Diagnostic Results in last 30 days:  No results found.  Assessment/Plan    Family/ staff Communication:   Labs/tests ordered:    Goals of care:      Durenda Age, DNP, FNP-BC [facility] [phone] (Monday-Friday 8:00 a.m. - 5:00 p.m.) [phone] (after hours)

## 2019-04-26 NOTE — Progress Notes (Signed)
This encounter was created in error - please disregard.

## 2019-05-15 ENCOUNTER — Non-Acute Institutional Stay (SKILLED_NURSING_FACILITY): Payer: Medicare Other | Admitting: Adult Health

## 2019-05-15 ENCOUNTER — Encounter: Payer: Self-pay | Admitting: Adult Health

## 2019-05-15 DIAGNOSIS — I10 Essential (primary) hypertension: Secondary | ICD-10-CM | POA: Diagnosis not present

## 2019-05-15 DIAGNOSIS — Z794 Long term (current) use of insulin: Secondary | ICD-10-CM

## 2019-05-15 DIAGNOSIS — C9010 Plasma cell leukemia not having achieved remission: Secondary | ICD-10-CM

## 2019-05-15 DIAGNOSIS — I639 Cerebral infarction, unspecified: Secondary | ICD-10-CM

## 2019-05-15 DIAGNOSIS — N400 Enlarged prostate without lower urinary tract symptoms: Secondary | ICD-10-CM

## 2019-05-15 DIAGNOSIS — E1165 Type 2 diabetes mellitus with hyperglycemia: Secondary | ICD-10-CM | POA: Diagnosis not present

## 2019-05-15 NOTE — Progress Notes (Signed)
Location:  [facility] Room Number: 124/A Place of Service:  SNF (31) Provider:  Durenda Age, DNP, FNP-BC  Patient Care Team: Beckie Salts, MD as PCP - General (Internal Medicine)  Extended Emergency Contact Information Primary Emergency Contact: Furber,[name] Address: [place], [place] [address] [name] of [place] Phone: [phone] Mobile Phone: [phone] Relation: Son Secondary Emergency Contact: [name] [place] of [place] Mobile Phone: [phone] Relation: Other  Code Status:  DNR  Goals of care: Advanced Directive information Advanced Directives 05/15/2019  Does Patient Have a Medical Advance Directive? Yes  Type of Advance Directive Out of facility DNR (pink MOST or yellow form)  Does patient want to make changes to medical advance directive? No - Patient declined  Would patient like information on creating a medical advance directive? -  Pre-existing out of facility DNR order (yellow form or pink MOST form) -     Chief Complaint  Patient presents with  . Medical Management of Chronic Issues    Routine visit of medical management    HPI:  Pt is a 78 y.o. male seen today for medical management of chronic diseases.  He has PMH of diabetes mellitus with neuropathy, GERD, dyslipidemia, essential hypertension, history of CML, chronic kidney disease stage III and history of CHF.  He was seen in the room today. He asked when he can go home. He is currently having short-term rehabilitation after a recent hospitalization due to CVA - PT, OT and ST. Informed him that when it is safe for him to be discharged then he will go home.  He is currently taking aspirin and Plavix for CVA.  CBGs 100, 249, 188, 191, 134.  He is currently taking insulin lispro and Toujeo for diabetes mellitus.  BPs 140/77, 136/85, 140/78, 110/54, 122/68.  He is currently taking metoprolol succinate ER and losartan for hypertension.    Past Medical History:  Diagnosis Date  . Diabetes mellitus with neuropathy ([place])   . Hyperlipidemia   . Hypertension   . Leukemia ([place])   . MRSA infection 09/19/2015   left arm  . Urinary retention 01/11/2014   Past Surgical History:  Procedure Laterality Date  . APPENDECTOMY    . BACK SURGERY    . CHOLECYSTECTOMY    . EYE SURGERY    . INCISE AND DRAIN ABCESS Left 09/16/2015   forearm; debscess undere MAC; [facility]; Dr Curley Spice  . INCISION AND DRAINAGE ABSCESS Left 09/19/15   forearm wound washout with pulavac at [facility] by Dr Curley Spice    Allergies  Allergen Reactions  . Influenza Vaccines Hives  . Aspirin Other (See Comments)    DIZZINESS, UNCONSCIOUSNESS 09/16/15 pt takes 81 mg aspirin at home.[name], RPh  . Cyclobenzaprine Hives  . Dextromethorphan Other (See Comments)    Unknown   . Doxycycline Nausea Only  . Flu Virus Vaccine   . Gabapentin Other (See Comments)    GI UPSET,DROWSINESS  . Glipizide Other (See Comments)    DIZZINESS  . Hydrocodone-Acetaminophen Other (See Comments)    Unknown   . Iodinated Diagnostic Agents Other (See Comments)    Unknown  Unknown   . Latex Other (See Comments)    Unknown   . Lisinopril Other (See Comments)    COUGH  . Pregabalin Other (See Comments)    GI UPSET,DROWSINESS  . Ropinirole Nausea Only  . Salicylates Other (See Comments)    Unknown   .  Sulfamethoxazole-Trimethoprim Nausea Only  . Oxycodone-Acetaminophen Hives and Nausea Only  . Penicillins Hives, Itching and Rash    Has patient had a PCN reaction causing immediate rash, facial/tongue/throat swelling, SOB or lightheadedness with hypotension: Yes Has patient had a PCN reaction causing severe rash involving mucus membranes or skin necrosis: No Has patient had a PCN reaction that required hospitalization: No Has patient had a PCN reaction occurring within the last 10 years: No If all of the above answers are "NO", then may proceed with  Cephalosporin use.     Outpatient Encounter Medications as of 05/15/2019  Medication Sig  . acetaminophen (TYLENOL) 325 MG tablet Take 325 mg by mouth every 6 (six) hours as needed for mild pain, moderate pain or headache.   Marland Kitchen aspirin EC 81 MG tablet Take 81 mg by mouth every other day.   Marland Kitchen atorvastatin (LIPITOR) 80 MG tablet Take 80 mg by mouth daily.  Marland Kitchen azelastine (ASTELIN) 0.1 % nasal spray Place 1 spray into both nostrils 2 (two) times daily as needed for rhinitis or allergies.  . bisacodyl (DULCOLAX) 10 MG suppository If not relieved by MOM, give 10 mg Bisacodyl suppositiory rectally X 1 dose in 24 hours as needed (Do not use constipation standing orders for residents with renal failure/CFR less than 30. Contact MD for orders) (Physician Order)  . clopidogrel (PLAVIX) 75 MG tablet Take 75 mg by mouth daily. FOR CVA FOR 4 WEEKS  . dasatinib (SPRYCEL) 50 MG tablet Take 50 mg by mouth daily. For Leukemia  . ergocalciferol (VITAMIN D2) 1.25 MG (50000 UT) capsule Take 50,000 Units by mouth once a week. FOR VITAMIN D DEFICIENCY (DO NOT CRUSH)  . famotidine (PEPCID) 20 MG tablet Take 20 mg by mouth daily. For 2 weeks for upset stomach  . finasteride (PROSCAR) 5 MG tablet Take 5 mg by mouth daily.   . Insulin Glargine, 2 Unit Dial, (TOUJEO MAX SOLOSTAR) 300 UNIT/ML SOPN Inject 30 Units into the skin at bedtime. FOR DM * ADJUSTS BY 2 UNITS* (PRIME PEN WITH 3 UNITS PRIOR TO EACH USE, USE WITHIN 56 DAYS AFTER O  . insulin lispro (HUMALOG) 100 UNIT/ML KwikPen Junior INJECT 8 UNITS SUBCUTANEOUSLY AFTER EACH MEAL FOR DM (PRIME PEN WITH 2 UNITS PRIOR TO EACH USE - PEN EXPIRES 28 DAYS AFTER OPENING) (USE SAF  . losartan (COZAAR) 25 MG tablet Take 25 mg by mouth daily.  . magnesium hydroxide (MILK OF MAGNESIA) 400 MG/5ML suspension If no BM in 3 days, give 30 cc Milk of Magnesium p.o. x 1 dose in 24 hours as needed (Do not use standing constipation orders for residents with renal failure CFR less than 30.  Contact MD for orders) (Physician Order)  . magnesium oxide (MAG-OX) 400 MG tablet Take 400 mg by mouth 2 (two) times daily. x1 month  . metoprolol succinate (TOPROL-XL) 25 MG 24 hr tablet Take 25 mg by mouth daily.   . NON FORMULARY Diet: Mechanical soft with thin liquids  . Sodium Phosphates (RA SALINE ENEMA RE) If not relieved by Biscodyl suppository, give disposable Saline Enema rectally X 1 dose/24 hrs as needed (Do not use constipation standing orders for residents with renal failure/CFR less than 30. Contact MD for orders)(Physician Or  . tamsulosin (FLOMAX) 0.4 MG CAPS capsule Take 0.4 mg by mouth daily.   . [DISCONTINUED] dasatinib (SPRYCEL) 20 MG tablet Take 50 mg by mouth daily.    No facility-administered encounter medications on file as of 05/15/2019.  Review of Systems  GENERAL: No change in appetite, no fatigue, no weight changes, no fever, chills or weakness MOUTH and THROAT: Denies oral discomfort, gingival pain or bleeding RESPIRATORY: no cough, SOB, DOE, wheezing, hemoptysis CARDIAC: No chest pain, edema or palpitations GI: No abdominal pain, diarrhea, constipation, heart burn, nausea or vomiting GU: Denies dysuria, frequency, hematuria, or discharge NEUROLOGICAL: Denies dizziness, syncope, numbness, or headache PSYCHIATRIC: Denies feelings of depression or anxiety. No report of hallucinations, insomnia, paranoia, or agitation    Immunization History  Administered Date(s) Administered  . Influenza, Seasonal, Injecte, Preservative Fre 08/21/2012  . Influenza-Unspecified 08/21/2012  . Pneumococcal Conjugate-13 02/28/2015, 11/25/2015  . Pneumococcal Polysaccharide-23 08/26/2016   Pertinent  Health Maintenance Due  Topic Date Due  . INFLUENZA VACCINE  05/26/2019 (Originally 03/17/2019)  . FOOT EXAM  05/26/2019 (Originally 08/14/1951)  . HEMOGLOBIN A1C  05/26/2019 (Originally 01/27/2019)  . OPHTHALMOLOGY EXAM  05/26/2019 (Originally 08/14/1951)  . COLONOSCOPY   05/26/2019 (Originally 08/14/1993)  . PNA vac Low Risk Adult  Completed   No flowsheet data found.   Vitals:   05/15/19 1103  BP: 140/77  Pulse: 87  Resp: 20  Temp: (!) 96.8 F (36 C)  TempSrc: Oral  SpO2: 100%  Weight: 162 lb 6.4 oz (73.7 kg)  Height: _0  (1.854 m)   Body mass index is 21.43 kg/m.  Physical Exam  GENERAL APPEARANCE: Well nourished. In no acute distress. Normal body habitus SKIN:  Skin is warm and dry.  MOUTH and THROAT: Lips are without lesions. Oral mucosa is moist and without lesions. Tongue is normal in shape, size, and color and without lesions RESPIRATORY: Breathing is even & unlabored, BS CTAB CARDIAC: RRR, no murmur,no extra heart sounds, no edema GI: Abdomen soft, normal BS, no masses, no tenderness EXTREMITIES:  Able to move X 4 extremities NEUROLOGICAL: There is no tremor. Speech is clear. Alert and oriented X 3. PSYCHIATRIC:  Affect and behavior are appropriate   Labs reviewed: Recent Labs    07/28/18 1430  07/30/18 0620 10/09/18 0328 04/16/19 2027  NA  --    < > 135 132* 137  K  --    < > 4.4 4.5 3.3*  CL  --    < > 104 101 104  CO2  --    < > _1 GLUCOSE  --    < > 334* 353* 171*  BUN  --    < > 26* 26* 17  CREATININE 1.18   < > 1.02 1.01 0.96  CALCIUM  --    < > 8.2* 8.8* 9.0  MG 2.1  --   --   --   --    < > = values in this interval not displayed.   Recent Labs    07/28/18 0907 04/16/19 2027  AST 32 19  ALT 22 14  ALKPHOS 101 71  BILITOT 0.9 0.5  PROT 8.2* 7.6  ALBUMIN 4.4 3.7   Recent Labs    07/28/18 0907  08/01/18 0623 10/09/18 0328 04/16/19 2027  WBC 13.7*   < > 5.0 6.0 7.6  NEUTROABS 9.0*  --   --  3.7 4.4  HGB 12.6*   < > 9.6* 10.5* 11.2*  HCT 37.3*   < > 30.2* 31.4* 33.4*  MCV 83.6   < > 88.3 83.3 82.1  PLT 275   < > 146* 182 211   < > = values in this interval not displayed.   Lab Results  Component Value Date   TSH 2.322 07/28/2018   Lab Results  Component Value Date   HGBA1C 13.2 (H)  07/28/2018     Assessment/Plan  1. Cerebrovascular accident (CVA), unspecified mechanism ([place]) - continue PT and OT for therapeutic strengthening exercises, ice 15, Plavix and atorvastatin  2. Essential (primary) hypertension -Stable, continue metoprolol succinate ER and losartan  3. Type 2 diabetes mellitus with hyperglycemia, with long-term current use of insulin ([facility]) -stable, continue Toujeo and insulin lispro  4. Benign fibroma of prostate -Denies urinary retention, continue finasteride and tamsulosin  5. 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  Labs/tests ordered:   None  Goals of care:   Short-term care   Durenda Age, DNP, FNP-BC [facility] [phone] (Monday-Friday 8:00 a.m. - 5:00 p.m.) [phone] (after hours)

## 2019-05-17 DIAGNOSIS — C9590 Leukemia, unspecified not having achieved remission: Secondary | ICD-10-CM | POA: Insufficient documentation

## 2019-05-18 ENCOUNTER — Encounter: Payer: Self-pay | Admitting: Adult Health

## 2019-05-18 ENCOUNTER — Non-Acute Institutional Stay (SKILLED_NURSING_FACILITY): Payer: Medicare Other | Admitting: Adult Health

## 2019-05-18 DIAGNOSIS — Z794 Long term (current) use of insulin: Secondary | ICD-10-CM

## 2019-05-18 DIAGNOSIS — E1165 Type 2 diabetes mellitus with hyperglycemia: Secondary | ICD-10-CM

## 2019-05-18 DIAGNOSIS — I639 Cerebral infarction, unspecified: Secondary | ICD-10-CM | POA: Diagnosis not present

## 2019-05-18 DIAGNOSIS — C9010 Plasma cell leukemia not having achieved remission: Secondary | ICD-10-CM

## 2019-05-18 DIAGNOSIS — I509 Heart failure, unspecified: Secondary | ICD-10-CM

## 2019-05-18 DIAGNOSIS — N400 Enlarged prostate without lower urinary tract symptoms: Secondary | ICD-10-CM

## 2019-05-18 DIAGNOSIS — I10 Essential (primary) hypertension: Secondary | ICD-10-CM | POA: Diagnosis not present

## 2019-05-18 DIAGNOSIS — E559 Vitamin D deficiency, unspecified: Secondary | ICD-10-CM

## 2019-05-18 LAB — HEMOGLOBIN A1C: Hemoglobin A1C: 7.6

## 2019-05-18 MED ORDER — LOSARTAN POTASSIUM 25 MG PO TABS: 25.0000 mg | ORAL_TABLET | Freq: Every day | ORAL | 0 refills | Status: DC

## 2019-05-18 MED ORDER — INSULIN LISPRO (0.5 UNIT DIAL) 100 UNIT/ML (KWIKPEN JR): 8.0000 [IU] | PEN_INJECTOR | Freq: Three times a day (TID) | SUBCUTANEOUS | 0 refills | Status: DC

## 2019-05-18 MED ORDER — FINASTERIDE 5 MG PO TABS: 5.0000 mg | ORAL_TABLET | Freq: Every day | ORAL | 0 refills | Status: AC

## 2019-05-18 MED ORDER — TOUJEO MAX SOLOSTAR 300 UNIT/ML ~~LOC~~ SOPN: 30.0000 [IU] | PEN_INJECTOR | Freq: Every day | SUBCUTANEOUS | 0 refills | Status: DC

## 2019-05-18 MED ORDER — METOPROLOL SUCCINATE ER 25 MG PO TB24: 25.0000 mg | ORAL_TABLET | Freq: Every day | ORAL | 0 refills | Status: DC

## 2019-05-18 MED ORDER — ATORVASTATIN CALCIUM 80 MG PO TABS: 80.0000 mg | ORAL_TABLET | Freq: Every day | ORAL | 0 refills | Status: DC

## 2019-05-18 MED ORDER — DASATINIB 50 MG PO TABS: 50.0000 mg | ORAL_TABLET | Freq: Every day | ORAL | 0 refills | Status: DC

## 2019-05-18 MED ORDER — TAMSULOSIN HCL 0.4 MG PO CAPS: 0.4000 mg | ORAL_CAPSULE | Freq: Every day | ORAL | 0 refills | Status: DC

## 2019-05-18 MED ORDER — ERGOCALCIFEROL 1.25 MG (50000 UT) PO CAPS: 50000.0000 [IU] | ORAL_CAPSULE | ORAL | 0 refills | Status: DC

## 2019-05-18 NOTE — Progress Notes (Signed)
Location:  [facility] Room Number: 124A Place of Service:  SNF (31) Provider:  Durenda Age, DNP, FNP-BC  Patient Care Team: Beckie Salts, MD as PCP - General (Internal Medicine)  Extended Emergency Contact Information Primary Emergency Contact: Ries,[name] Address: [place], [place] [address] [name] of [place] Phone: [phone] Mobile Phone: [phone] Relation: Son Secondary Emergency Contact: [name] [place] of [place] Mobile Phone: [phone] Relation: Other  Code Status:  DNR  Goals of care: Advanced Directive information Advanced Directives 05/15/2019  Does Patient Have a Medical Advance Directive? Yes  Type of Advance Directive Out of facility DNR (pink MOST or yellow form)  Does patient want to make changes to medical advance directive? No - Patient declined  Would patient like information on creating a medical advance directive? -  Pre-existing out of facility DNR order (yellow form or pink MOST form) -     Chief Complaint  Patient presents with  . Discharge Note    HPI:  Pt is a 78 y.o. Jerry [name] who is for discharge home on 05/21/19 with Home health  PT and OT.  He has been admitted to [facility] on 04/21/19 from hospitalization 04/17/19 to 04/21/19 due to CVA. He presented to the hospital with right arm pain and weakness in addition to a fall that happened a few days before hospitalization. He complained of dysuria and was found to have UTI and Levofloxacin was started. Brain CT showed no abnormality. Brain MRI showed small acute infarct right body of corpus collosum and adjacent frontal lobe. Echo with bubble study negative, carotid doppler 40-59% stenosis bilateral ICA, transcranial ultrasound right intracranial velocity decreased, left not visualized, basilar artery velocity normal. Neurology was consulted and recommended aspirin 81 mg and Plavix 75 mg daily for 30  days and then ASA 325 mg daily with Lipitor 80 mg daily.  Patient was admitted to this facility for short-term rehabilitation after the patient's recent hospitalization.  Patient has completed SNF rehabilitation and therapy has cleared the patient for discharge.    Past Medical History:  Diagnosis Date  . Diabetes mellitus with neuropathy ([place])   . Hyperlipidemia   . Hypertension   . Leukemia ([place])   . MRSA infection 09/19/2015   left arm  . Urinary retention 01/11/2014   Past Surgical History:  Procedure Laterality Date  . APPENDECTOMY    . BACK SURGERY    . CHOLECYSTECTOMY    . EYE SURGERY    . INCISE AND DRAIN ABCESS Left 09/16/2015   forearm; debscess undere MAC; [facility]; Dr Curley Spice  . INCISION AND DRAINAGE ABSCESS Left 09/19/15   forearm wound washout with pulavac at [facility] by Dr Curley Spice    Allergies  Allergen Reactions  . Influenza Vaccines Hives  . Aspirin Other (See Comments)    DIZZINESS, UNCONSCIOUSNESS 09/16/15 pt takes 81 mg aspirin at home.[name], RPh  . Cyclobenzaprine Hives  . Dextromethorphan Other (See Comments)    Unknown   . Doxycycline Nausea Only  . Flu Virus Vaccine   . Gabapentin Other (See Comments)    GI UPSET,DROWSINESS  . Glipizide Other (See Comments)    DIZZINESS  . Hydrocodone-Acetaminophen Other (See Comments)    Unknown   . Iodinated Diagnostic Agents Other (See Comments)    Unknown  Unknown   . Latex Other (See Comments)    Unknown   . Lisinopril Other (See Comments)  COUGH  . Pregabalin Other (See Comments)    GI UPSET,DROWSINESS  . Ropinirole Nausea Only  . Salicylates Other (See Comments)    Unknown   . Sulfamethoxazole-Trimethoprim Nausea Only  . Oxycodone-Acetaminophen Hives and Nausea Only  . Penicillins Hives, Itching and Rash    Has patient had a PCN reaction causing immediate rash, facial/tongue/throat swelling, SOB or lightheadedness with hypotension: Yes Has patient had a PCN reaction causing  severe rash involving mucus membranes or skin necrosis: No Has patient had a PCN reaction that required hospitalization: No Has patient had a PCN reaction occurring within the last 10 years: No If all of the above answers are "NO", then may proceed with Cephalosporin use.     Outpatient Encounter Medications as of 05/18/2019  Medication Sig  . acetaminophen (TYLENOL) 325 MG tablet Take 325 mg by mouth every 6 (six) hours as needed for mild pain, moderate pain or headache.   Marland Kitchen aspirin EC 81 MG tablet Take 81 mg by mouth every other day.   Marland Kitchen atorvastatin (LIPITOR) 80 MG tablet Take 1 tablet (80 mg total) by mouth daily.  Marland Kitchen azelastine (ASTELIN) 0.1 % nasal spray Place 1 spray into both nostrils 2 (two) times daily as needed for rhinitis or allergies.  . bisacodyl (DULCOLAX) 10 MG suppository If not relieved by MOM, give 10 mg Bisacodyl suppositiory rectally X 1 dose in 24 hours as needed (Do not use constipation standing orders for residents with renal failure/CFR less than 30. Contact MD for orders) (Physician Order)  . clopidogrel (PLAVIX) 75 MG tablet Take 75 mg by mouth daily. FOR CVA FOR 4 WEEKS  . dasatinib (SPRYCEL) 50 MG tablet Take 1 tablet (50 mg total) by mouth daily. For Leukemia  . ergocalciferol (VITAMIN D2) 1.25 MG (50000 UT) capsule Take 1 capsule (50,000 Units total) by mouth once a week. FOR VITAMIN D DEFICIENCY (DO NOT CRUSH)  . famotidine (PEPCID) 20 MG tablet Take 20 mg by mouth daily. For 2 weeks for upset stomach  . finasteride (PROSCAR) 5 MG tablet Take 1 tablet (5 mg total) by mouth daily.  . Insulin Glargine, 2 Unit Dial, (TOUJEO MAX SOLOSTAR) 300 UNIT/ML SOPN Inject 30 Units into the skin at bedtime for 30 doses. FOR DM * ADJUSTS BY 2 UNITS* (PRIME PEN WITH 3 UNITS PRIOR TO EACH USE, USE WITHIN Jerry DAYS AFTER O  . insulin lispro (HUMALOG) 100 UNIT/ML KwikPen Junior Inject 0.08 mLs (8 Units total) into the skin 3 (three) times daily. INJECT 8 UNITS SUBCUTANEOUSLY AFTER EACH  MEAL FOR DM (PRIME PEN WITH 2 UNITS PRIOR TO EACH USE - PEN EXPIRES 28 DAYS AFTER OPENING) (USE SAF  . losartan (COZAAR) 25 MG tablet Take 1 tablet (25 mg total) by mouth daily.  . magnesium hydroxide (MILK OF MAGNESIA) 400 MG/5ML suspension If no BM in 3 days, give 30 cc Milk of Magnesium p.o. x 1 dose in 24 hours as needed (Do not use standing constipation orders for residents with renal failure CFR less than 30. Contact MD for orders) (Physician Order)  . magnesium oxide (MAG-OX) 400 MG tablet Take 400 mg by mouth 2 (two) times daily. x1 month  . metoprolol succinate (TOPROL-XL) 25 MG 24 hr tablet Take 1 tablet (25 mg total) by mouth daily.  . NON FORMULARY Diet: Mechanical soft with thin liquids  . Sodium Phosphates (RA SALINE ENEMA RE) If not relieved by Biscodyl suppository, give disposable Saline Enema rectally X 1 dose/24 hrs as needed (Do not  use constipation standing orders for residents with renal failure/CFR less than 30. Contact MD for orders)(Physician Or  . tamsulosin (FLOMAX) 0.4 MG CAPS capsule Take 1 capsule (0.4 mg total) by mouth daily.  . [DISCONTINUED] atorvastatin (LIPITOR) 80 MG tablet Take 80 mg by mouth daily.  . [DISCONTINUED] dasatinib (SPRYCEL) 50 MG tablet Take 50 mg by mouth daily. For Leukemia  . [DISCONTINUED] ergocalciferol (VITAMIN D2) 1.25 MG (50000 UT) capsule Take 50,000 Units by mouth once a week. FOR VITAMIN D DEFICIENCY (DO NOT CRUSH)  . [DISCONTINUED] finasteride (PROSCAR) 5 MG tablet Take 5 mg by mouth daily.   . [DISCONTINUED] Insulin Glargine, 2 Unit Dial, (TOUJEO MAX SOLOSTAR) 300 UNIT/ML SOPN Inject 30 Units into the skin at bedtime. FOR DM * ADJUSTS BY 2 UNITS* (PRIME PEN WITH 3 UNITS PRIOR TO EACH USE, USE WITHIN Jerry DAYS AFTER O  . [DISCONTINUED] insulin lispro (HUMALOG) 100 UNIT/ML KwikPen Junior INJECT 8 UNITS SUBCUTANEOUSLY AFTER EACH MEAL FOR DM (PRIME PEN WITH 2 UNITS PRIOR TO EACH USE - PEN EXPIRES 28 DAYS AFTER OPENING) (USE SAF  . [DISCONTINUED]  losartan (COZAAR) 25 MG tablet Take 25 mg by mouth daily.  . [DISCONTINUED] metoprolol succinate (TOPROL-XL) 25 MG 24 hr tablet Take 25 mg by mouth daily.   . [DISCONTINUED] tamsulosin (FLOMAX) 0.4 MG CAPS capsule Take 0.4 mg by mouth daily.    No facility-administered encounter medications on file as of 05/18/2019.     Review of Systems  GENERAL: No change in appetite, no fatigue, no weight changes, no fever, chills or weakness MOUTH and THROAT: Denies oral discomfort, gingival pain or bleeding RESPIRATORY: no cough, SOB, DOE, wheezing, hemoptysis CARDIAC: No chest pain, edema or palpitations GI: No abdominal pain, diarrhea, constipation, heart burn, nausea or vomiting GU: Denies dysuria, frequency, hematuria, incontinence, or discharge NEUROLOGICAL: Denies dizziness, syncope, numbness, or headache PSYCHIATRIC: Denies feelings of depression or anxiety. No report of hallucinations, insomnia, paranoia, or agitation   Immunization History  Administered Date(s) Administered  . Influenza, Seasonal, Injecte, Preservative Fre 08/21/2012  . Influenza-Unspecified 08/21/2012  . Pneumococcal Conjugate-13 02/28/2015, 11/25/2015  . Pneumococcal Polysaccharide-23 08/26/2016   Pertinent  Health Maintenance Due  Topic Date Due  . INFLUENZA VACCINE  05/26/2019 (Originally 03/17/2019)  . FOOT EXAM  05/26/2019 (Originally 08/14/1951)  . HEMOGLOBIN A1C  05/26/2019 (Originally 01/27/2019)  . OPHTHALMOLOGY EXAM  05/26/2019 (Originally 08/14/1951)  . COLONOSCOPY  05/26/2019 (Originally 08/14/1993)  . PNA vac Low Risk Adult  Completed   No flowsheet data found.   Vitals:   05/18/19 1504  BP: (!) 118/59  Pulse: 95  Resp: 20  Temp: 98.2 F (36.8 C)  Weight: 161 lb 3.2 oz (73.1 kg)   Body mass index is 21.27 kg/m.  Physical Exam  GENERAL APPEARANCE: Well nourished. In no acute distress. Normal body habitus SKIN:  Skin is warm and dry.  MOUTH and THROAT: Lips are without lesions. Oral mucosa  is moist and without lesions. Tongue is normal in shape, size, and color and without lesions RESPIRATORY: Breathing is even & unlabored, BS CTAB CARDIAC: RRR, no murmur,no extra heart sounds, no edema GI: Abdomen soft, normal BS, no masses, no tenderness EXTREMITIES:  Able to move X 4 extremities NEUROLOGICAL: There is no tremor. Speech is clear. Alert and oriented X 3. PSYCHIATRIC:  Affect and behavior are appropriate  Labs reviewed: Recent Labs    07/28/18 1430  07/30/18 0620 10/09/18 0328 04/16/19 2027  NA  --    < > 135 132*  137  K  --    < > 4.4 4.5 3.3*  CL  --    < > 104 101 104  CO2  --    < > _0 GLUCOSE  --    < > 334* 353* 171*  BUN  --    < > 26* 26* 17  CREATININE 1.18   < > 1.02 1.01 0.96  CALCIUM  --    < > 8.2* 8.8* 9.0  MG 2.1  --   --   --   --    < > = values in this interval not displayed.   Recent Labs    07/28/18 0907 04/16/19 2027  AST 32 19  ALT 22 14  ALKPHOS 101 71  BILITOT 0.9 0.5  PROT 8.2* 7.6  ALBUMIN 4.4 3.7   Recent Labs    07/28/18 0907  08/01/18 0623 10/09/18 0328 04/16/19 2027  WBC 13.7*   < > 5.0 6.0 7.6  NEUTROABS 9.0*  --   --  3.7 4.4  HGB 12.6*   < > 9.6* 10.5* 11.2*  HCT 37.3*   < > 30.2* 31.4* 33.4*  MCV 83.6   < > 88.3 83.3 82.1  PLT 275   < > 146* 182 211   < > = values in this interval not displayed.   Lab Results  Component Value Date   TSH 2.322 07/28/2018   Lab Results  Component Value Date   HGBA1C 13.2 (H) 07/28/2018    Assessment/Plan  1. Cerebrovascular accident (CVA), unspecified mechanism ([place]) - for Home health PT and OT for therapeutic strengthening exercises, follow- up with neurology  - will start ASA EC 325 mg 1 tab by mouth daily starting on 05/20/19  - Plavix and ASA 81 mg will be discontinued on 04/19/19  - metoprolol succinate (TOPROL-XL) 25 MG 24 hr tablet; Take 1 tablet (25 mg total) by mouth daily.  Dispense: 30 tablet; Refill: 0 - losartan (COZAAR) 25 MG tablet; Take 1 tablet  (25 mg total) by mouth daily.  Dispense: 30 tablet; Refill: 0 - atorvastatin (LIPITOR) 80 MG tablet; Take 1 tablet (80 mg total) by mouth daily.  Dispense: 30 tablet; Refill: 0 - 2. Congestive heart failure, unspecified HF chronicity, unspecified heart failure type (HCC) - metoprolol succinate (TOPROL-XL) 25 MG 24 hr tablet; Take 1 tablet (25 mg total) by mouth daily.  Dispense: 30 tablet; Refill: 0 -  losartan (COZAAR) 25 MG tablet; Take 1 tablet (25 mg total) by mouth daily.  3. Essential (primary) hypertension - metoprolol succinate (TOPROL-XL) 25 MG 24 hr tablet; Take 1 tablet (25 mg total) by mouth daily.  Dispense: 30 tablet; Refill: 0 - losartan (COZAAR) 25 MG tablet; Take 1 tablet (25 mg total) by mouth daily.  Dispense: 30 tablet; Refill: 0  4. Type 2 diabetes mellitus with hyperglycemia, with long-term current use of insulin (HCC) - insulin lispro (HUMALOG) 100 UNIT/ML KwikPen Junior; Inject 0.08 mLs (8 Units total) into the skin 3 (three) times daily. INJECT 8 UNITS SUBCUTANEOUSLY AFTER EACH MEAL FOR DM (PRIME PEN WITH 2 UNITS PRIOR TO EACH USE - PEN EXPIRES 28 DAYS AFTER OPENING) (USE SAF  Dispense: 7.2 mL; Refill: 0 - Insulin Glargine, 2 Unit Dial, (TOUJEO MAX SOLOSTAR) 300 UNIT/ML SOPN; Inject 30 Units into the skin at bedtime for 30 doses. FOR DM * ADJUSTS BY 2 UNITS* (PRIME PEN WITH 3 UNITS PRIOR TO EACH USE, USE WITHIN Jerry DAYS AFTER O  Dispense: 4.5 mL; Refill: 0  5. Benign fibroma of prostate - finasteride (PROSCAR) 5 MG tablet; Take 1 tablet (5 mg total) by mouth daily.  Dispense: 30 tablet; Refill: 0 - tamsulosin (FLOMAX) 0.4 MG CAPS capsule; Take 1 capsule (0.4 mg total) by mouth daily.  Dispense: 30 capsule; Refill: 0  6. 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 For Leukemia  Dispense: 30 tablet; Refill: 0  7. Avitaminosis D - ergocalciferol (VITAMIN D2) 1.25 MG (50000 UT)  capsule; Take 1 capsule (50,000 Units total) by mouth once a week. FOR VITAMIN D DEFICIENCY (DO NOT CRUSH)  Dispense: 4 capsule; Refill: 0     I have filled out patient's discharge paperwork and written prescriptions.  Patient will receive home health PT and OT.  DME provided: 3-in-1 bedside commode, rolling walker and shower chair  Total discharge time: Greater than 30 minutes Greater than 50% was spent in counseling and coordination of care.   Discharge time involved coordination of the discharge process with social worker, nursing staff and therapy department. Medical justification for home health services/DME verified.   Durenda Age, DNP, FNP-BC [facility] [phone] (Monday-Friday 8:00 a.m. - 5:00 p.m.) [phone] (after hours)

## 2019-05-19 LAB — HEMOGLOBIN A1C: Hemoglobin A1C: 7.6

## 2019-07-06 IMAGING — CT CT HEAD W/O CM
4 of 7 series · 15 of 47 positions shown, 17 images · non-contrast
Comparison: September 26, 2016

CLINICAL DATA: Altered mental status with nausea and vomiting.

EXAM:
CT HEAD WITHOUT CONTRAST
TECHNIQUE: Contiguous axial images were obtained from the base of the skull
through the vertex without intravenous contrast.

[Series 2: head wo · axial · 0.47mm/px · z∈[-104,+16]mm · 7 of 32 slices shown, 9 images (1 of 2)]
[im 4/32  brain]
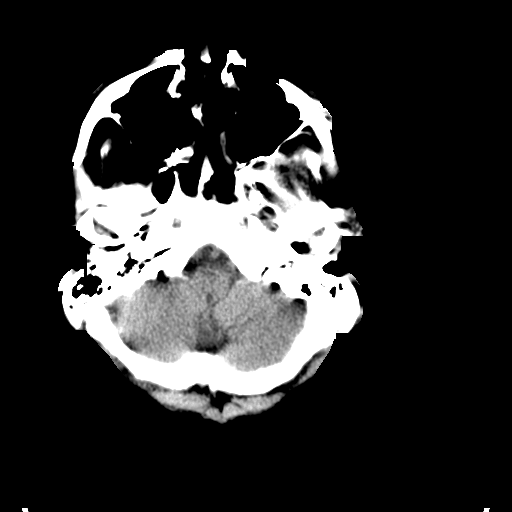
[im 4/32  bone]
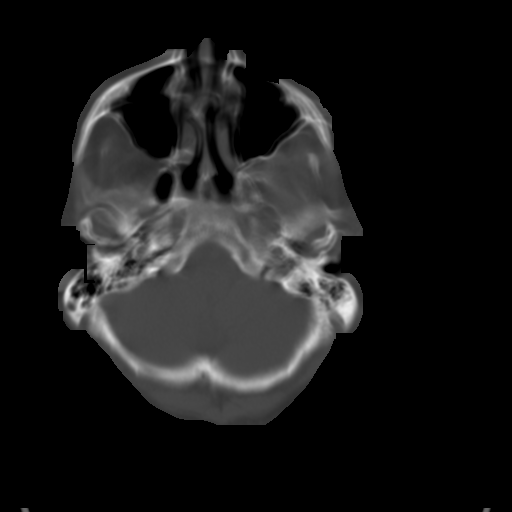
[im 8/32  brain]
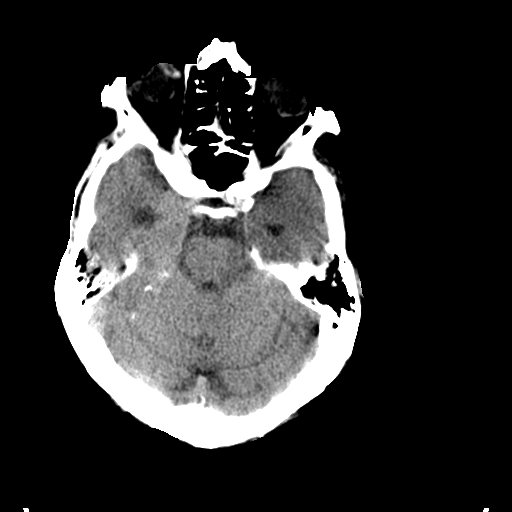
[im 12/32  brain]
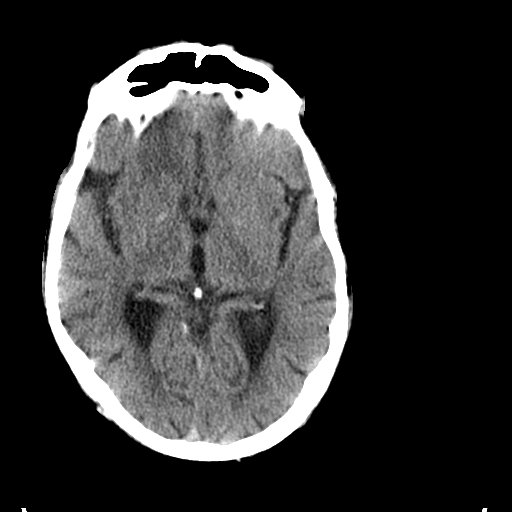
[im 16/32  brain]
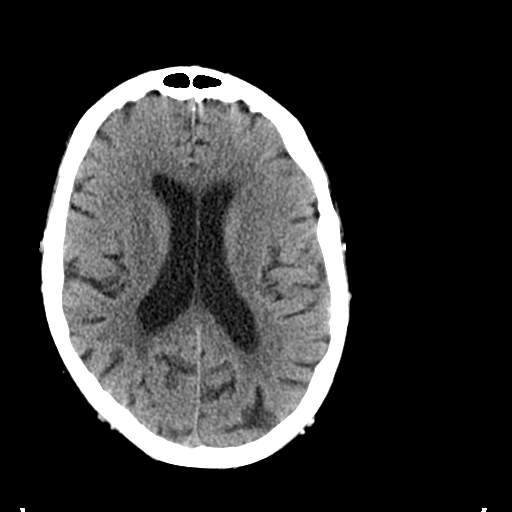
[im 20/32  brain]
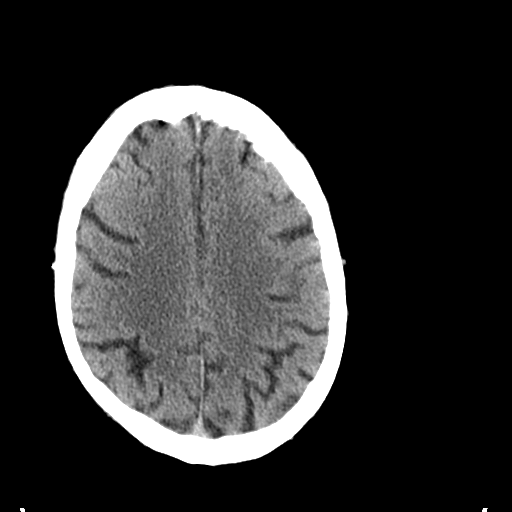
[im 20/32  bone]
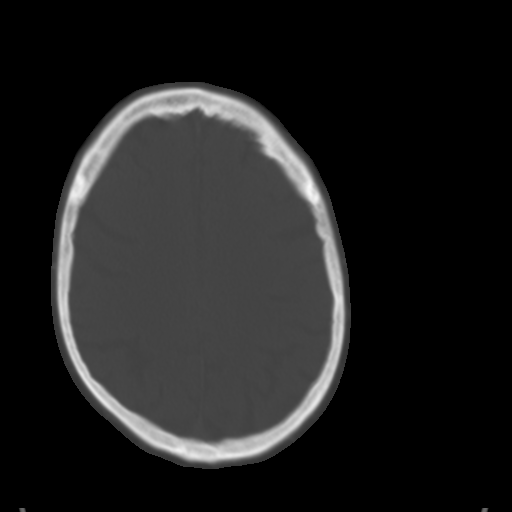
[im 24/32  brain]
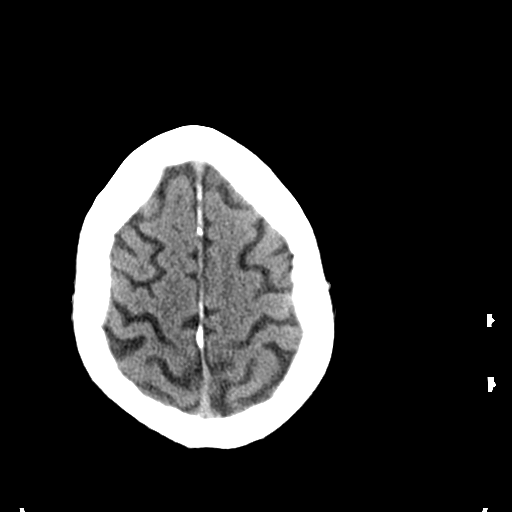
[im 28/32  brain]
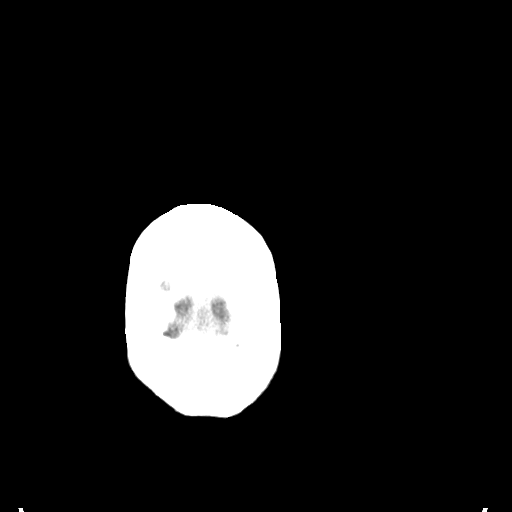

[Series 3: coronal soft tissue · coronal · 0.32mm/px · 3 of 84 slices shown]
[im 21/84  brain]
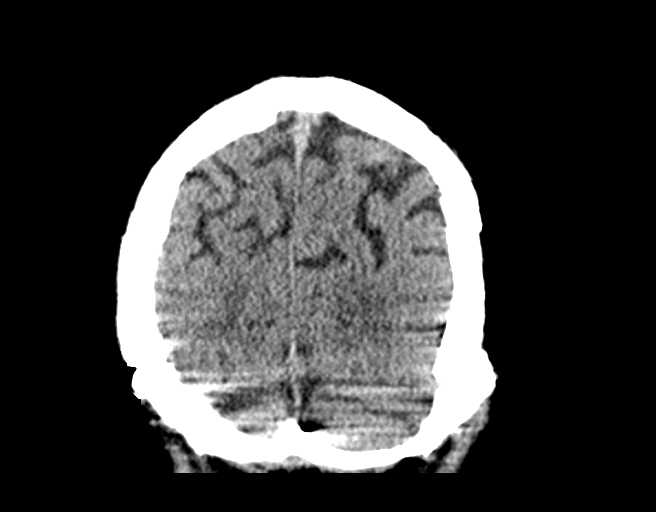
[im 42/84  brain]
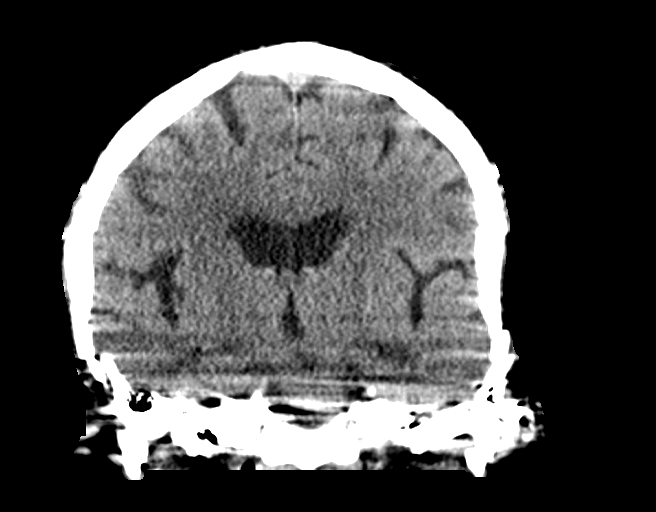
[im 63/84  brain]
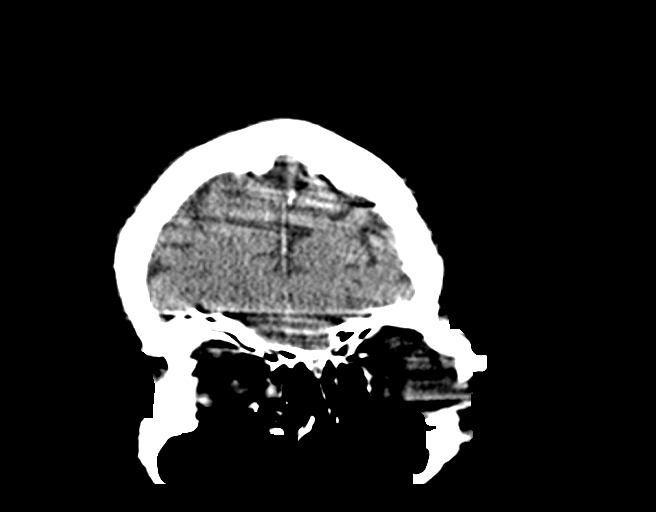

[Series 6: head wo · axial · 0.47mm/px · z∈[-99,-54]mm · 3 of 33 slices shown (2 of 2)]
[im 5/33  brain]
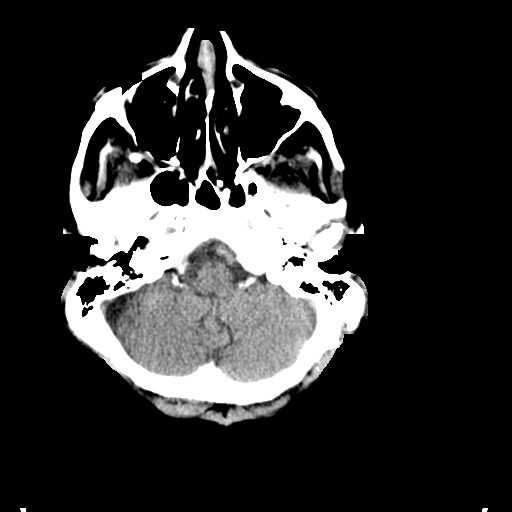
[im 10/33  brain]
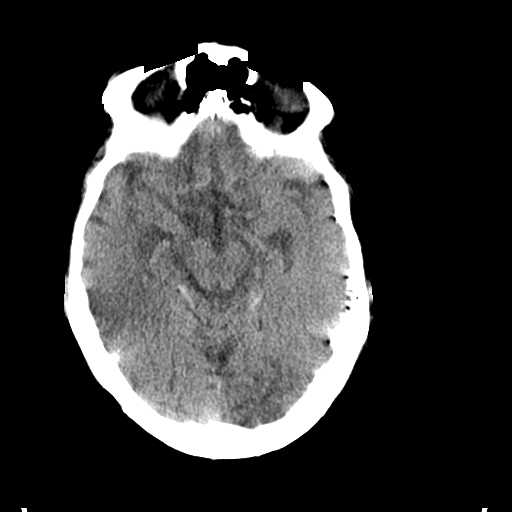
[im 14/33  brain]
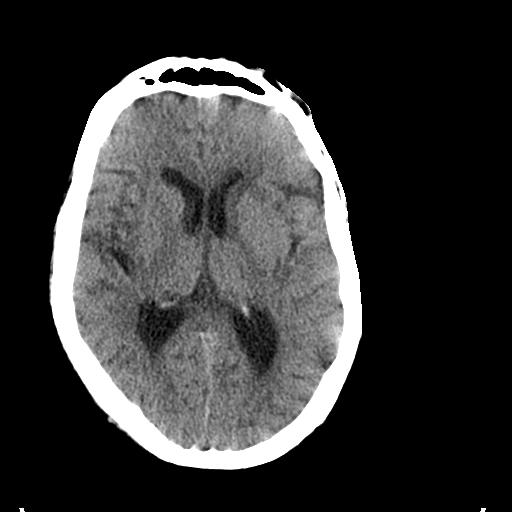

[Series 10: sagittal soft tissue · sagittal · 0.32mm/px · 2 of 84 slices shown]
[im 28/84  brain]
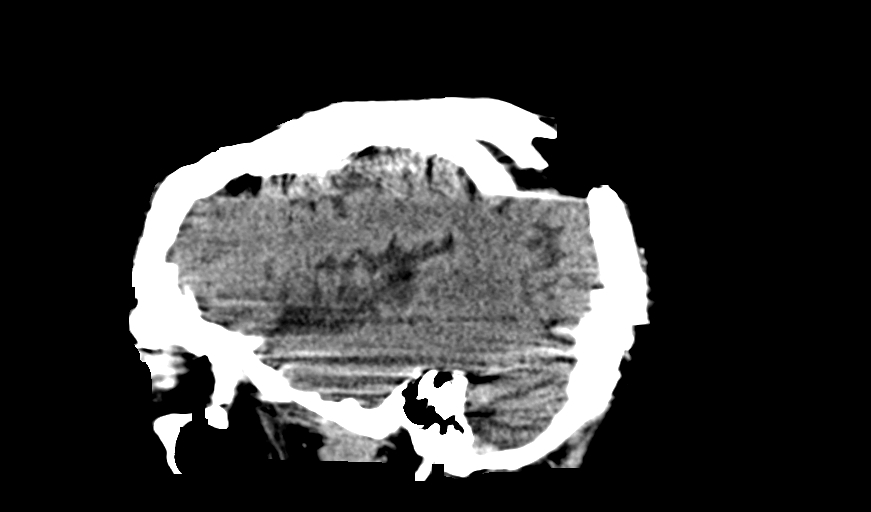
[im 56/84  brain]
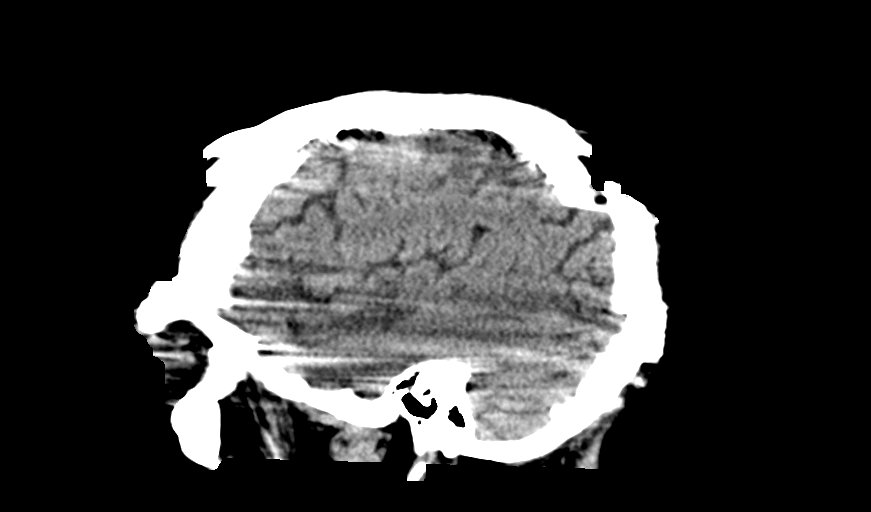

[15 of 47 positions shown; findings below may reference images not displayed]

FINDINGS: Brain: There is age related volume loss, stable. There is no
intracranial mass, hemorrhage, extra-axial fluid collection, or
midline shift. There is slight small vessel disease in the centra
semiovale bilaterally. No acute appearing infarct is demonstrable on
this study.

Vascular: There is no appreciable hyperdense vessel. There is
calcification in each distal vertebral artery as well as in each
carotid siphon region.

Skull: The bony calvarium appears intact.

Sinuses/Orbits: There is mucosal thickening in several ethmoid air
cells bilaterally. Other visualized paranasal sinuses are clear.
There is evidence of prior scleral banding on the right. Orbits
appear symmetric elsewhere bilaterally.

Other: Mastoid air cells are clear.
IMPRESSION: Age related volume loss with mild periventricular small vessel
disease. No acute infarct evident. No mass or hemorrhage.

Foci of arterial vascular calcification noted. Mucosal thickening
noted in several ethmoid air cells. Scleral banding right globe.

## 2019-07-06 IMAGING — CT CT RENAL STONE PROTOCOL
2 of 4 series · 16 of 46 positions shown, 18 images · non-contrast
Comparison: 02/11/2018

CLINICAL DATA: Renal failure.  Vomiting.  Lactic acidosis.

EXAM:
CT ABDOMEN AND PELVIS WITHOUT CONTRAST
TECHNIQUE: Multidetector CT imaging of the abdomen and pelvis was performed
following the standard protocol without IV contrast.

[Series 2: axial st · axial · 0.77mm/px · z∈[+1038,+1502]mm · 13 of 105 slices shown, 15 images]
[im 6/105  soft-tissue]
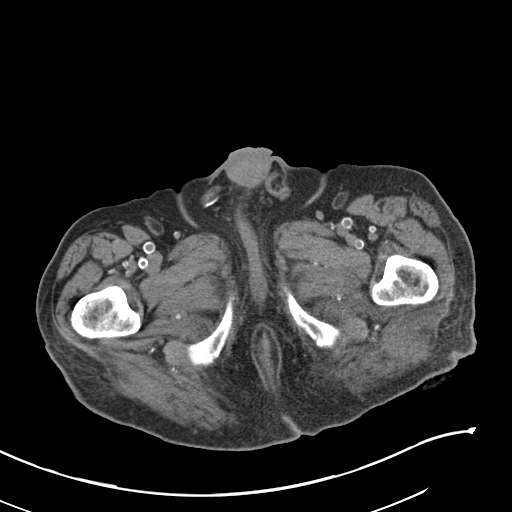
[im 6/105  bone]
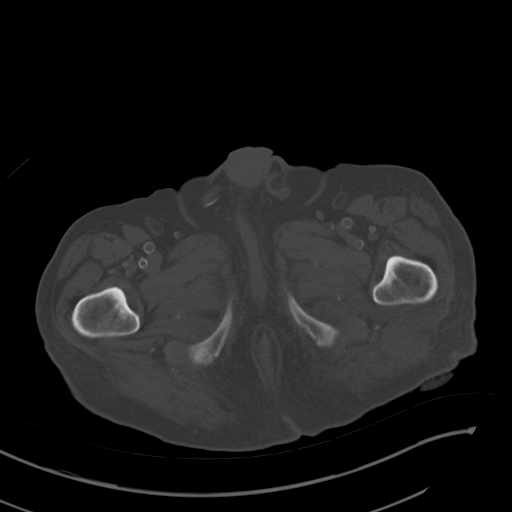
[im 12/105  soft-tissue]
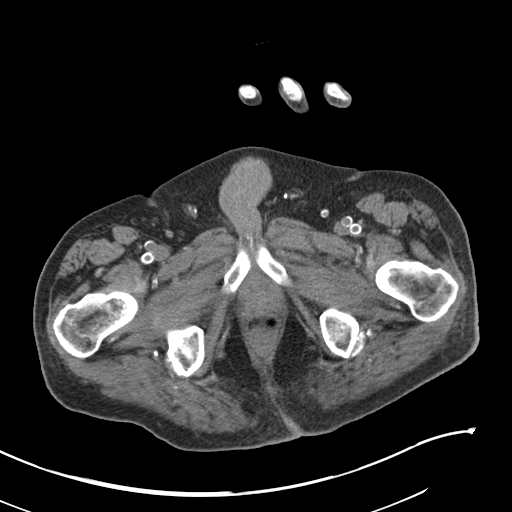
[im 24/105  soft-tissue]
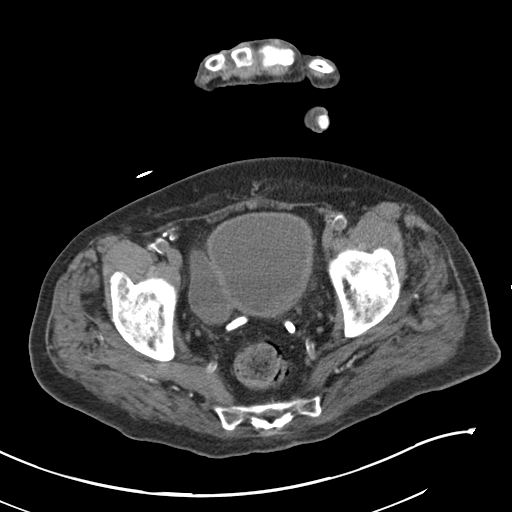
[im 29/105  soft-tissue]
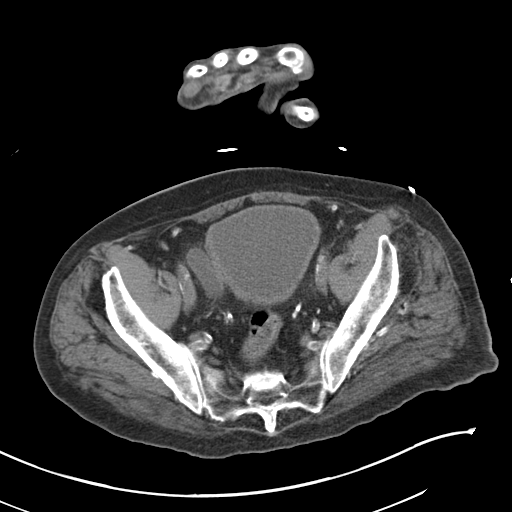
[im 35/105  soft-tissue]
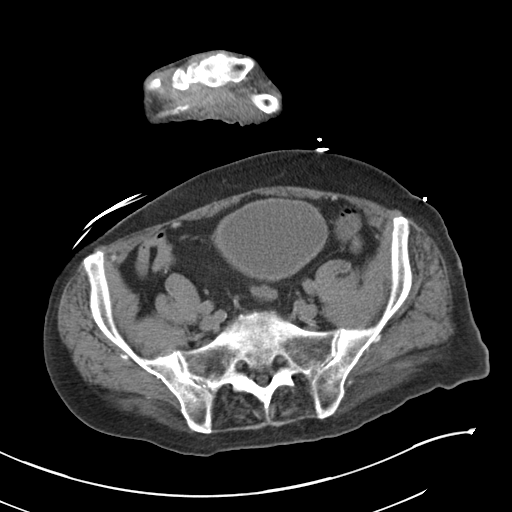
[im 47/105  soft-tissue]
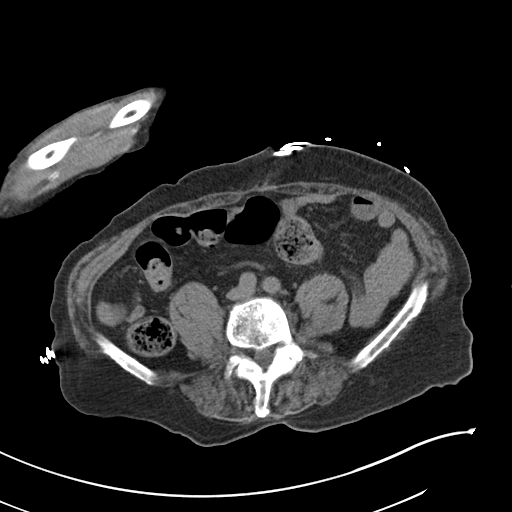
[im 53/105  soft-tissue]
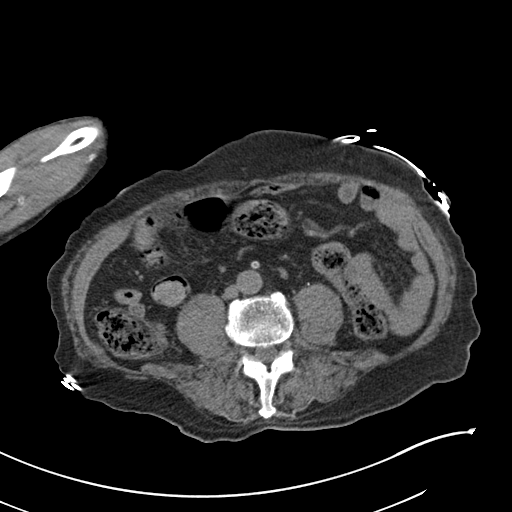
[im 58/105  soft-tissue]
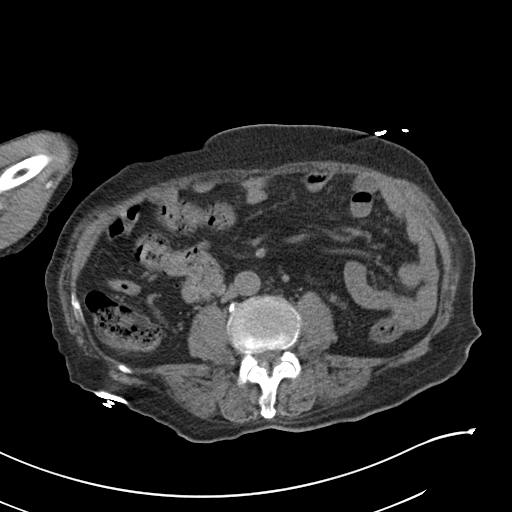
[im 70/105  soft-tissue]
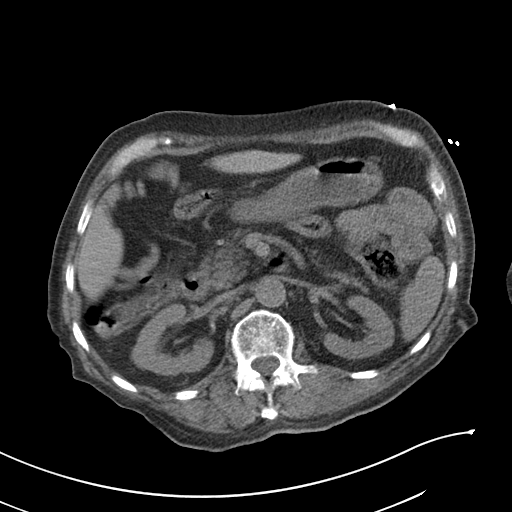
[im 70/105  bone]
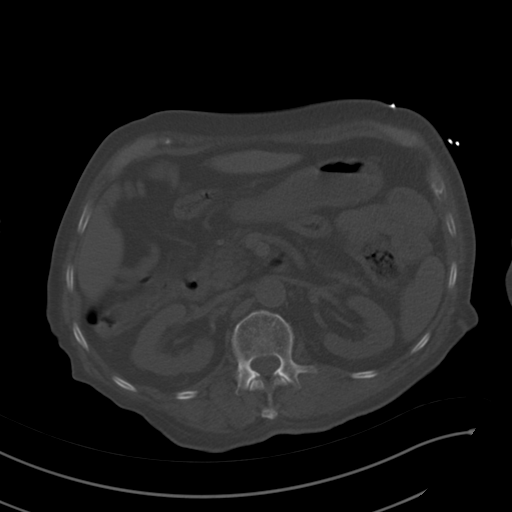
[im 76/105  soft-tissue]
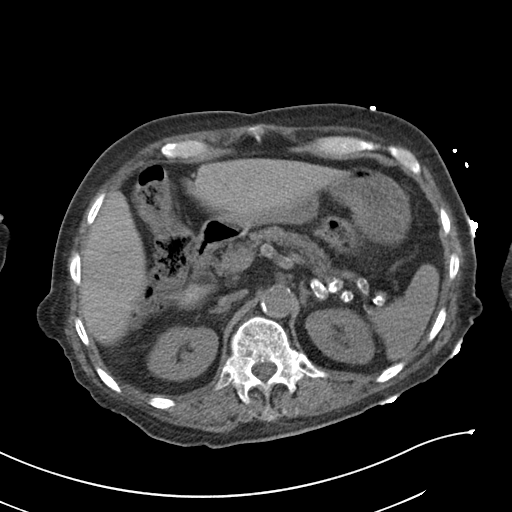
[im 81/105  soft-tissue]
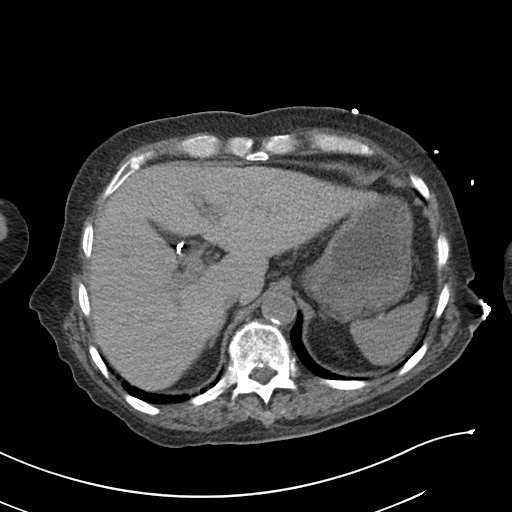
[im 93/105  soft-tissue]
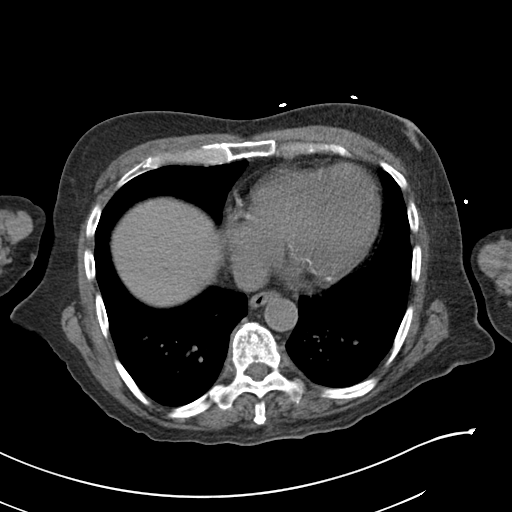
[im 99/105  soft-tissue]
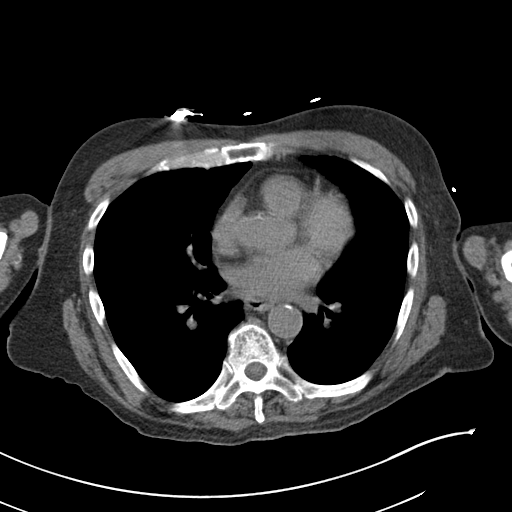

[Series 5: coronal · coronal · 0.82mm/px · 3 of 173 slices shown]
[im 58/173  soft-tissue]
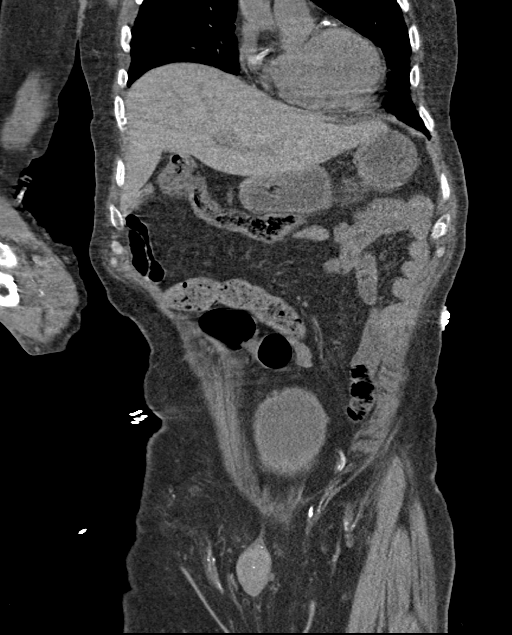
[im 77/173  soft-tissue]
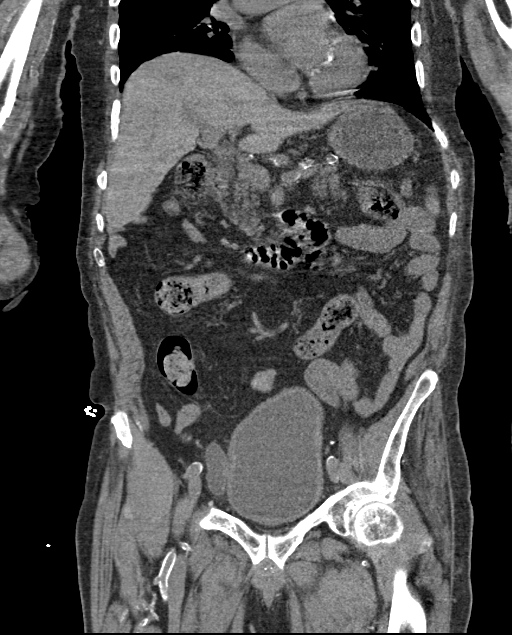
[im 96/173  soft-tissue]
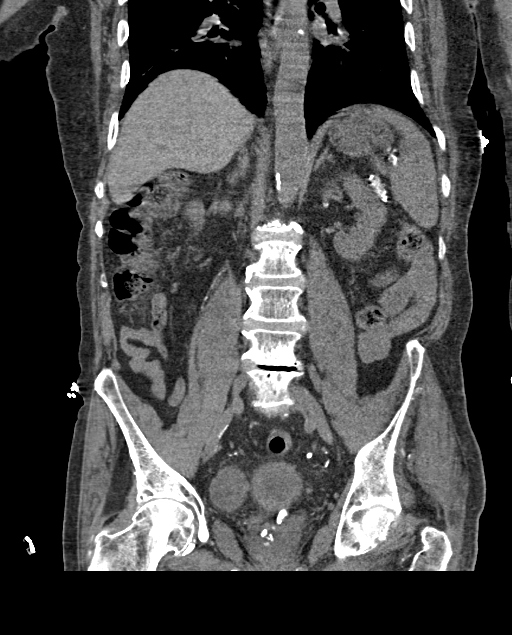

[16 of 46 positions shown; findings below may reference images not displayed]

FINDINGS: Lower chest: Moderate to diffuse coronary artery calcifications.
Heart is normal size.

Hepatobiliary: No focal hepatic abnormality.  Prior cholecystectomy

Pancreas: No focal abnormality or ductal dilatation.

Spleen: No focal abnormality.  Normal size.

Adrenals/Urinary Tract: No adrenal abnormality. No focal renal
abnormality. No stones or hydronephrosis. Bladder wall diverticula
noted both right lateral and left lateral. The largest is on the
right measuring up to 5.6 cm.

Stomach/Bowel: Stomach, large and small bowel grossly unremarkable.

Vascular/Lymphatic: Aortic atherosclerosis. No enlarged abdominal or
pelvic lymph nodes.

Reproductive: No visible focal abnormality.

Other: No free fluid or free air.

Musculoskeletal: No acute bony abnormality.
IMPRESSION: No renal or ureteral stones. No hydronephrosis. Two bladder wall
diverticula, the largest on the right measuring up to 5.6 cm.

Prior cholecystectomy.

Aortic atherosclerosis.

No acute findings in the abdomen or pelvis.

## 2019-12-06 ENCOUNTER — Non-Acute Institutional Stay (SKILLED_NURSING_FACILITY): Payer: Medicare Other | Admitting: Internal Medicine

## 2019-12-06 ENCOUNTER — Encounter: Payer: Self-pay | Admitting: Internal Medicine

## 2019-12-06 DIAGNOSIS — C9010 Plasma cell leukemia not having achieved remission: Secondary | ICD-10-CM | POA: Diagnosis not present

## 2019-12-06 DIAGNOSIS — E1143 Type 2 diabetes mellitus with diabetic autonomic (poly)neuropathy: Secondary | ICD-10-CM | POA: Diagnosis not present

## 2019-12-06 DIAGNOSIS — I509 Heart failure, unspecified: Secondary | ICD-10-CM | POA: Diagnosis not present

## 2019-12-06 DIAGNOSIS — Z794 Long term (current) use of insulin: Secondary | ICD-10-CM

## 2019-12-06 DIAGNOSIS — R4189 Other symptoms and signs involving cognitive functions and awareness: Secondary | ICD-10-CM

## 2019-12-06 DIAGNOSIS — R29818 Other symptoms and signs involving the nervous system: Secondary | ICD-10-CM

## 2019-12-06 DIAGNOSIS — E1165 Type 2 diabetes mellitus with hyperglycemia: Secondary | ICD-10-CM

## 2019-12-06 DIAGNOSIS — IMO0002 Reserved for concepts with insufficient information to code with codable children: Secondary | ICD-10-CM

## 2019-12-06 MED ORDER — LOSARTAN POTASSIUM 25 MG PO TABS
25.00 | ORAL_TABLET | ORAL | Status: DC
Start: 2019-12-06 — End: 2019-12-06

## 2019-12-06 MED ORDER — INSULIN LISPRO 100 UNIT/ML ~~LOC~~ SOLN
1.00 | SUBCUTANEOUS | Status: DC
Start: 2019-12-05 — End: 2019-12-06

## 2019-12-06 MED ORDER — DEXTROSE 10 % IV SOLN
125.00 | INTRAVENOUS | Status: DC
Start: ? — End: 2019-12-06

## 2019-12-06 MED ORDER — FUROSEMIDE 20 MG PO TABS
20.00 | ORAL_TABLET | ORAL | Status: DC
Start: 2019-12-06 — End: 2019-12-06

## 2019-12-06 MED ORDER — GLUCAGON (RDNA) 1 MG IJ KIT
1.00 | PACK | INTRAMUSCULAR | Status: DC
Start: ? — End: 2019-12-06

## 2019-12-06 MED ORDER — TAMSULOSIN HCL 0.4 MG PO CAPS
0.40 | ORAL_CAPSULE | ORAL | Status: DC
Start: 2019-12-06 — End: 2019-12-06

## 2019-12-06 MED ORDER — METOPROLOL SUCCINATE ER 50 MG PO TB24
50.00 | ORAL_TABLET | ORAL | Status: DC
Start: 2019-12-06 — End: 2019-12-06

## 2019-12-06 MED ORDER — FINASTERIDE 5 MG PO TABS
5.00 | ORAL_TABLET | ORAL | Status: DC
Start: 2019-12-06 — End: 2019-12-06

## 2019-12-06 MED ORDER — CLOTRIMAZOLE 1 % EX CREA
TOPICAL_CREAM | CUTANEOUS | Status: DC
Start: 2019-12-05 — End: 2019-12-06

## 2019-12-06 MED ORDER — GLUCOSE 40 % PO GEL
15.00 | ORAL | Status: DC
Start: ? — End: 2019-12-06

## 2019-12-06 MED ORDER — ACETAMINOPHEN 325 MG PO TABS
650.00 | ORAL_TABLET | ORAL | Status: DC
Start: ? — End: 2019-12-06

## 2019-12-06 MED ORDER — DASATINIB 50 MG PO TABS
50.00 | ORAL_TABLET | ORAL | Status: DC
Start: 2019-12-06 — End: 2019-12-06

## 2019-12-06 MED ORDER — ONDANSETRON HCL 4 MG/2ML IJ SOLN
4.00 | INTRAMUSCULAR | Status: DC
Start: ? — End: 2019-12-06

## 2019-12-06 MED ORDER — HEPARIN SODIUM (PORCINE) 5000 UNIT/ML IJ SOLN
5000.00 | INTRAMUSCULAR | Status: DC
Start: 2019-12-05 — End: 2019-12-06

## 2019-12-06 MED ORDER — ATORVASTATIN CALCIUM 40 MG PO TABS
80.00 | ORAL_TABLET | ORAL | Status: DC
Start: 2019-12-05 — End: 2019-12-06

## 2019-12-06 MED ORDER — INSULIN GLARGINE 100 UNIT/ML ~~LOC~~ SOLN
35.00 | SUBCUTANEOUS | Status: DC
Start: 2019-12-05 — End: 2019-12-06

## 2019-12-06 MED ORDER — BENZONATATE 100 MG PO CAPS
100.00 | ORAL_CAPSULE | ORAL | Status: DC
Start: ? — End: 2019-12-06

## 2019-12-06 NOTE — Assessment & Plan Note (Signed)
09/21/2019 A1c 7.6% indicating adequate but not optimal control.  Primary goal is to avoid hypoglycemia which has been an issue previously.

## 2019-12-06 NOTE — Assessment & Plan Note (Signed)
He remains on [facility] Copy Oncologist on this H&P

## 2019-12-06 NOTE — Patient Instructions (Signed)
See assessment and plan under each diagnosis in the problem list and acutely for this visit 

## 2019-12-06 NOTE — Progress Notes (Signed)
NURSING HOME LOCATION:  [facility] ROOM NUMBER:  317-B  CODE STATUS:  Full Code  PCP:  Beckie Salts, MD No address on file  This is a comprehensive admission note to [facility] performed on this date less than 30 days from date of admission. Included are preadmission medical/surgical history; reconciled medication list; family history; social history and comprehensive review of systems.  Corrections and additions to the records were documented. Comprehensive physical exam was also performed. Additionally a clinical summary was entered for each active diagnosis pertinent to this admission in the Problem List to enhance continuity of care.  HPI: Patient was hospitalized at [facility] 4/17-4/21/2021 with acute on chronic diastolic congestive heart failure associated with generalized weakness and dyspnea present for 2 months. Troponin was elevated suggesting demand ischemia.   Echocardiogram revealed mild concentric left ventricular hypertrophy with ejection fraction of 50-55%.  LV systolic function is low normal and filling pattern is pseudonormal.  There was mild apical hypokinesis.  Borderline LAE was present.  Aortic valve sclerosis was also noted without aortic stenosis.  Mild mitral regurgitation was present. He did exhibit a relatively stable anemia H/H 9.3/28.1.  Chemistries revealed prerenal azotemia with a BUN of 39, hypocalcemia with a calcium of 8.1, and hypoalbuminemia with a value of 2.9. With diuresis the patient had progressive improvement symptomatically.  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 Surgeries include cholecystectomy and I&D of abscess x2.  Social history: Nondrinker, former smoker.  He states that he quit drinking and smoking 49 years ago when he got married. His contact is his  son Inocente Salles and wife Langley Gauss at [phone].  Family history: Reviewed   Review of systems: Veracity is in question as date given as December 02, 1999. He could not name his Oncologist.  He believes he last saw him a year ago.  He described the condition as "white corpuscles eating up the red".  He was unaware of that he is still on Sprycel. He denies nocturnal dyspnea but states he does wake up through the evening.  He also describes edema "sometimes".  He has a nonproductive cough without constitutional symptoms or purulent secretions.  He states that while hospitalized his glucoses dropped as low as 49.  At home he states the fasting glucoses were approximately 200.  He does describe polyuria and increased thirst.  He also describes slight dysuria occasionally.  He states that his legs will jerk and he also has numbness in the feet.  PT/OT staff questions weakness on the left. He denies any history of CVA. He states that his stool was dark recently. He denies anxiety or depression although when he talks about his wife he stated "wish I had her back".  Constitutional: No fever, significant weight change, fatigue  Eyes: No redness, discharge, pain, vision change ENT/mouth: No nasal congestion, purulent discharge, earache, change in hearing, sore throat  Cardiovascular: No chest pain, palpitations Respiratory: No sputum production, hemoptysis,  significant snoring, apnea  Gastrointestinal: No heartburn, dysphagia, abdominal pain, nausea /vomiting, rectal bleeding Genitourinary: No hematuria, pyuria, incontinence, nocturia Musculoskeletal: No joint stiffness, joint swelling Dermatologic: No rash, pruritus, change in appearance of skin Neurologic: No dizziness, headache, syncope, seizures,  tingling Psychiatric: No significant anorexia Endocrine: No change in hair/skin/nails,  excessive hunger Hematologic/lymphatic: No significant bruising, lymphadenopathy, abnormal bleeding Allergy/immunology: No  itchy/watery eyes, significant sneezing, urticaria, angioedema  Physical  exam:  Pertinent or positive findings: He appears disheveled and chronically ill.  Pattern alopecia is present.He has a beard and mustache. He is hard of hearing.  His responses are slow and he is slightly hoarse.  Pupils are small.  He stares blankly at the examiner.  Lower lids are puffy.  He is edentulous.  Second heart sound is increased.  He has scattered minor low-grade rales.  Pedal pulses are decreased.  Interosseous and gastrocnemius wasting is present.  Toenails are deformed.  Strength to opposition is decreased possibly slightly more on the left.   General appearance:  no acute distress, increased work of breathing is present.   Lymphatic: No lymphadenopathy about the head, neck, axilla. Eyes: No conjunctival inflammation  is present. There is no scleral icterus. Ears:  External ear exam shows no significant lesions or deformities.   Nose:  External nasal examination shows no deformity or inflammation. Nasal mucosa are pink and moist without lesions, exudates Oral exam: Lips and gums are healthy appearing.There is no oropharyngeal erythema or exudate. Neck:  No thyromegaly, masses, tenderness noted.    Heart:  Normal rate and regular rhythm. S1 normal without gallop, murmur, click, rub.  Lungs: without wheezes, rhonchi, rubs. Abdomen: Bowel sounds are normal.  Abdomen is soft and nontender with no organomegaly, hernias, masses. GU: Deferred  Extremities:  No cyanosis, clubbing, edema. Neurologic exam:Balance, Rhomberg, finger to nose testing could not be completed due to clinical state Skin: Warm & dry w/o tenting. No significant lesions or rash.  See clinical summary under each active problem in the Problem List with associated updated therapeutic plan

## 2019-12-06 NOTE — Assessment & Plan Note (Signed)
Continue sodium restriction and low-dose furosemide with adjustment as clinically indicated

## 2019-12-07 NOTE — Assessment & Plan Note (Signed)
Attempted to discuss with son , but no answer @ [phone] son

## 2019-12-11 ENCOUNTER — Encounter: Payer: Self-pay | Admitting: Adult Health

## 2019-12-11 ENCOUNTER — Non-Acute Institutional Stay (SKILLED_NURSING_FACILITY): Payer: Medicare Other | Admitting: Adult Health

## 2019-12-11 DIAGNOSIS — I5032 Chronic diastolic (congestive) heart failure: Secondary | ICD-10-CM

## 2019-12-11 DIAGNOSIS — M79672 Pain in left foot: Secondary | ICD-10-CM

## 2019-12-11 DIAGNOSIS — F5101 Primary insomnia: Secondary | ICD-10-CM | POA: Diagnosis not present

## 2019-12-11 NOTE — Progress Notes (Signed)
Location:  [facility] Room Number: 317-B Place of Service:  SNF (31) Provider:  Durenda Age, DNP, FNP-BC  Patient Care Team: Jerry Salts, MD as PCP - General (Internal Medicine)  Extended Emergency Contact Information Primary Emergency Contact: Jerry [name],Jerry [name] Address: [place],  [address] Jerry [name] of [place] Phone: [phone] Mobile Phone: [phone] Relation: Son Secondary Emergency Contact: Jerry [name] [place] of [place] Mobile Phone: [phone] Relation: Other  Code Status:  Full Code  Goals of care: Advanced Directive information Advanced Directives 05/15/2019  Does Patient Have a Medical Advance Directive? Yes  Type of Advance Directive Out of facility DNR (pink MOST or yellow form)  Does patient want to make changes to medical advance directive? No - Patient declined  Would patient like information on creating a medical advance directive? -  Pre-existing out of facility DNR order (yellow form or pink MOST form) -     Chief Complaint  Patient presents with  . Acute Visit    Patient is seen for left ankle pain.    HPI:  Pt is a 79 y.o. male seen today for left ankle pain. He was seen in his room today while in bed. He reported having bone spur on his left foot. No erythema, wound nor edema noted on his left foot. He has 18 allergies,ASA and Percocet included. Last night, he reported not having enough sleep and would requested for medication for sleep. He is currently having a short-term rehabilitation at [facility] post hospitalization 12/01/19 to 12/05/19 at [facility] for CHF exacerbation.  He was diuresed and improved symptomatically.  He has a PMH of CHF, diabetes mellitus, BPH and hyperlipidemia.    Past Medical History:  Diagnosis Date  . Diabetes mellitus with neuropathy ([place])   . Hyperlipidemia   . Hypertension   . Leukemia ([place])    on  Sprycel  . MRSA infection 09/19/2015   left arm  . Urinary retention 01/11/2014   Past Surgical History:  Procedure Laterality Date  . APPENDECTOMY    . BACK SURGERY    . CHOLECYSTECTOMY    . EYE SURGERY    . INCISE AND DRAIN ABCESS Left 09/16/2015   forearm; debscess undere MAC; [facility]; Dr Curley Spice  . INCISION AND DRAINAGE ABSCESS Left 09/19/15   forearm wound washout with pulavac at [facility] by Dr Curley Spice    Allergies  Allergen Reactions  . Influenza Vaccines Hives  . Aspirin Other (See Comments)    DIZZINESS, UNCONSCIOUSNESS 09/16/15 pt takes 81 mg aspirin at home.Jerry [name], RPh  . Cyclobenzaprine Hives  . Dextromethorphan Other (See Comments)    Unknown   . Doxycycline Nausea Only  . Flu Virus Vaccine   . Gabapentin Other (See Comments)    GI UPSET,DROWSINESS  . Glipizide Other (See Comments)    DIZZINESS  . Hydrocodone-Acetaminophen Other (See Comments)    Unknown   . Iodinated Diagnostic Agents Other (See Comments)    Unknown  Unknown   . Latex Other (See Comments)    Unknown   . Lisinopril Other (See Comments)    COUGH  . Pregabalin Other (See Comments)    GI UPSET,DROWSINESS  . Ropinirole Nausea Only  . Salicylates Other (See Comments)    Unknown   . Sulfamethoxazole-Trimethoprim Nausea Only  . Oxycodone-Acetaminophen Hives and Nausea Only  . Penicillins Hives, Itching and Rash    Has patient  had a PCN reaction causing immediate rash, facial/tongue/throat swelling, SOB or lightheadedness with hypotension: Yes Has patient had a PCN reaction causing severe rash involving mucus membranes or skin necrosis: No Has patient had a PCN reaction that required hospitalization: No Has patient had a PCN reaction occurring within the last 10 years: No If all of the above answers are "NO", then may proceed with Cephalosporin use.     Outpatient Encounter Medications as of 12/11/2019  Medication Sig  . acetaminophen (TYLENOL) 325 MG tablet Take 325 mg  by mouth every 6 (six) hours as needed for mild pain, moderate pain or headache.   Marland Kitchen atorvastatin (LIPITOR) 80 MG tablet Take 1 tablet (80 mg total) by mouth daily.  . bisacodyl (DULCOLAX) 10 MG suppository If not relieved by MOM, give 10 mg Bisacodyl suppositiory rectally X 1 dose in 24 hours as needed (Do not use constipation standing orders for residents with renal failure/CFR less than 30. Contact MD for orders) (Physician Order)  . magnesium hydroxide (MILK OF MAGNESIA) 400 MG/5ML suspension If no BM in 3 days, give 30 cc Milk of Magnesium p.o. x 1 dose in 24 hours as needed (Do not use standing constipation orders for residents with renal failure CFR less than 30. Contact MD for orders) (Physician Order)  . Nutritional Supplement LIQD Take 120 mLs by mouth in the morning and at bedtime.  . Sodium Phosphates (RA SALINE ENEMA RE) If not relieved by Biscodyl suppository, give disposable Saline Enema rectally X 1 dose/24 hrs as needed (Do not use constipation standing orders for residents with renal failure/CFR less than 30. Contact MD for orders)(Physician Or  . azelastine (ASTELIN) 0.1 % nasal spray Place 1 spray into both nostrils 2 (two) times daily as needed for rhinitis or allergies.  Marland Kitchen dasatinib (SPRYCEL) 50 MG tablet Take 1 tablet (50 mg total) by mouth daily. For Leukemia  . ergocalciferol (VITAMIN D2) 1.25 MG (50000 UT) capsule Take 1 capsule (50,000 Units total) by mouth once a week. FOR VITAMIN D DEFICIENCY (DO NOT CRUSH)  . finasteride (PROSCAR) 5 MG tablet Take 5 mg by mouth daily.  . furosemide (LASIX) 20 MG tablet Take 20 mg by mouth daily.  . insulin glargine, 1 Unit Dial, (TOUJEO SOLOSTAR) 300 UNIT/ML Solostar Pen Inject 35 Units into the skin at bedtime.  Marland Kitchen losartan (COZAAR) 25 MG tablet Take 1 tablet (25 mg total) by mouth daily.  . metoprolol succinate (TOPROL-XL) 50 MG 24 hr tablet Take 50 mg by mouth daily. Take with or immediately following a meal.  . nitroGLYCERIN (NITROSTAT)  0.4 MG SL tablet Place 0.4 mg under the tongue every 5 (five) minutes as needed for chest pain.  . tamsulosin (FLOMAX) 0.4 MG CAPS capsule Take 1 capsule (0.4 mg total) by mouth daily.   No facility-administered encounter medications on file as of 12/11/2019.    Review of Systems  GENERAL: No change in appetite, no fatigue, no weight changes, no fever, chills or weakness MOUTH and THROAT: Denies oral discomfort, gingival pain or bleeding RESPIRATORY: no cough, SOB, DOE, wheezing, hemoptysis CARDIAC: No chest pain, edema or palpitations GI: No abdominal pain, diarrhea, constipation, heart burn, nausea or vomiting GU: Denies dysuria, frequency, hematuria or discharge NEUROLOGICAL: Denies dizziness, syncope, numbness, or headache PSYCHIATRIC: Denies feelings of depression or anxiety. No report of hallucinations, insomnia, paranoia, or agitation   Immunization History  Administered Date(s) Administered  . Influenza, Seasonal, Injecte, Preservative Fre 08/21/2012  . Influenza-Unspecified 08/21/2012  . Pneumococcal Conjugate-13  02/28/2015, 11/25/2015  . Pneumococcal Polysaccharide-23 08/26/2016   Pertinent  Health Maintenance Due  Topic Date Due  . FOOT EXAM  Never done  . OPHTHALMOLOGY EXAM  Never done  . HEMOGLOBIN A1C  11/17/2019  . INFLUENZA VACCINE  03/16/2020  . PNA vac Low Risk Adult  Completed   No flowsheet data found.   Vitals:   12/11/19 1459  BP: (!) 91/53  Pulse: 68  Resp: 20  Temp: 97.7 F (36.5 C)  TempSrc: Oral  Weight: 160 lb 6.4 oz (72.8 kg)  Height: _0  (1.854 m)   Body mass index is 21.16 kg/m.  Physical Exam  GENERAL APPEARANCE: Well nourished. In no acute distress. Normal body habitus SKIN:  Skin is warm and dry.  MOUTH and THROAT: Lips are without lesions. Oral mucosa is moist and without lesions. Tongue is normal in shape, size, and color and without lesions RESPIRATORY: Breathing is even & unlabored, BS CTAB CARDIAC: RRR, no murmur,no extra  heart sounds, no edema GI: Abdomen soft, normal BS, no masses, no tenderness EXTREMITIES:  Able to move X 4 extremities NEUROLOGICAL: There is no tremor. Speech is clear. PSYCHIATRIC:  Affect and behavior are appropriate  Labs reviewed: Recent Labs    04/16/19 2027  NA 137  K 3.3*  CL 104  CO2 23  GLUCOSE 171*  BUN 17  CREATININE 0.96  CALCIUM 9.0   Recent Labs    04/16/19 2027  AST 19  ALT 14  ALKPHOS 71  BILITOT 0.5  PROT 7.6  ALBUMIN 3.7   Recent Labs    04/16/19 2027  WBC 7.6  NEUTROABS 4.4  HGB 11.2*  HCT 33.4*  MCV 82.1  PLT 211   Lab Results  Component Value Date   TSH 2.322 07/28/2018   Lab Results  Component Value Date   HGBA1C 7.6 05/19/2019     Assessment/Plan:  1. Left foot pain - will change Acetaminophen order to 650 mg BID and BID PRN  2. Chronic diastolic heart failure (HCC) - no SOB nor edema, continue Lasix and Metoprolol succinate   3. Primary insomnia.  - will start on Melatonin 3 mg at bedtime    Family/ staff Communication: Discussed plan of care with resident and charge nurse.  Labs/tests ordered: None  Goals of care:   Short-term care   Jerry Age, DNP, FNP-BC [facility] [phone] (Monday-Friday 8:00 a.m. - 5:00 p.m.) [phone] (after hours)

## 2019-12-19 ENCOUNTER — Non-Acute Institutional Stay (SKILLED_NURSING_FACILITY): Payer: Medicare Other | Admitting: Adult Health

## 2019-12-19 ENCOUNTER — Encounter: Payer: Self-pay | Admitting: Adult Health

## 2019-12-19 DIAGNOSIS — C9010 Plasma cell leukemia not having achieved remission: Secondary | ICD-10-CM

## 2019-12-19 DIAGNOSIS — E785 Hyperlipidemia, unspecified: Secondary | ICD-10-CM

## 2019-12-19 DIAGNOSIS — I10 Essential (primary) hypertension: Secondary | ICD-10-CM | POA: Diagnosis not present

## 2019-12-19 DIAGNOSIS — I639 Cerebral infarction, unspecified: Secondary | ICD-10-CM

## 2019-12-19 DIAGNOSIS — N400 Enlarged prostate without lower urinary tract symptoms: Secondary | ICD-10-CM | POA: Diagnosis not present

## 2019-12-19 DIAGNOSIS — I5032 Chronic diastolic (congestive) heart failure: Secondary | ICD-10-CM

## 2019-12-19 DIAGNOSIS — Z794 Long term (current) use of insulin: Secondary | ICD-10-CM

## 2019-12-19 DIAGNOSIS — E1165 Type 2 diabetes mellitus with hyperglycemia: Secondary | ICD-10-CM

## 2019-12-19 NOTE — Progress Notes (Signed)
Location:  [place] Room Number: 317-B Place of Service:  SNF (31) Provider:  Durenda Age, DNP, FNP-BC  Patient Care Team: Beckie Salts, MD as PCP - General (Internal Medicine)  Extended Emergency Contact Information Primary Emergency Contact: Dooner,[name] Address: [place], [place] [address] [name] Phone: [phone] Mobile Phone: [phone] Relation: Son Secondary Emergency Contact: [name] [place] of [place] Mobile Phone: [phone] Relation: Other  Code Status:  FULL CODE  Goals of care: Advanced Directive information Advanced Directives 05/15/2019  Does Patient Have a Medical Advance Directive? Yes  Type of Advance Directive Out of facility DNR (pink MOST or yellow form)  Does patient want to make changes to medical advance directive? No - Patient declined  Would patient like information on creating a medical advance directive? -  Pre-existing out of facility DNR order (yellow form or pink MOST form) -     Chief Complaint  Patient presents with  . Acute Visit    Possible discharge home    HPI:  Pt is a 79 y.o. male who is for possible discharge home with Home health PT and OT.   He was admitted to [facility] on 12/05/19 post hospitalization on 12/01/19 to 12/05/19 at [facility] for CHF exacerbation. He was diuresed and improved symptomatically.  He has PMH of CHF, diabetes mellitus, BPH and hyperlipidemia.  Resident and son decided to stay in the facility as long-term care for a few months.   Past Medical History:  Diagnosis Date  . Diabetes mellitus with neuropathy ([place])   . Hyperlipidemia   . Hypertension   . Leukemia ([place])    on Sprycel  . MRSA infection 09/19/2015   left arm  . Urinary retention 01/11/2014   Past Surgical History:  Procedure Laterality Date  . APPENDECTOMY    . BACK SURGERY    . CHOLECYSTECTOMY    .  EYE SURGERY    . INCISE AND DRAIN ABCESS Left 09/16/2015   forearm; debscess undere MAC; [facility]; Dr Curley Spice  . INCISION AND DRAINAGE ABSCESS Left 09/19/15   forearm wound washout with pulavac at [facility] by Dr Curley Spice    Allergies  Allergen Reactions  . Influenza Vaccines Hives  . Aspirin Other (See Comments)    DIZZINESS, UNCONSCIOUSNESS 09/16/15 pt takes 81 mg aspirin at home.[name], RPh  . Cyclobenzaprine Hives  . Dextromethorphan Other (See Comments)    Unknown   . Doxycycline Nausea Only  . Flu Virus Vaccine   . Gabapentin Other (See Comments)    GI UPSET,DROWSINESS  . Glipizide Other (See Comments)    DIZZINESS  . Hydrocodone-Acetaminophen Other (See Comments)    Unknown   . Iodinated Diagnostic Agents Other (See Comments)    Unknown  Unknown   . Latex Other (See Comments)    Unknown   . Lisinopril Other (See Comments)    COUGH  . Pregabalin Other (See Comments)    GI UPSET,DROWSINESS  . Ropinirole Nausea Only  . Salicylates Other (See Comments)    Unknown   . Sulfamethoxazole-Trimethoprim Nausea Only  . Oxycodone-Acetaminophen Hives and Nausea Only  . Penicillins Hives, Itching and Rash    Has patient had a PCN reaction causing immediate rash, facial/tongue/throat swelling, SOB or lightheadedness with hypotension: Yes Has patient had a PCN reaction causing severe rash involving mucus membranes or skin necrosis: No Has patient had  a PCN reaction that required hospitalization: No Has patient had a PCN reaction occurring within the last 10 years: No If all of the above answers are "NO", then may proceed with Cephalosporin use.     Outpatient Encounter Medications as of 12/19/2019  Medication Sig  . acetaminophen (TYLENOL) 325 MG tablet Take 325 mg by mouth every 8 (eight) hours as needed for mild pain, moderate pain or headache.   Marland Kitchen acetaminophen (TYLENOL) 325 MG tablet Take by mouth in the morning and at bedtime.  Marland Kitchen atorvastatin (LIPITOR) 80  MG tablet Take 1 tablet (80 mg total) by mouth daily.  Marland Kitchen azelastine (ASTELIN) 0.1 % nasal spray Place 1 spray into both nostrils 2 (two) times daily as needed for rhinitis or allergies.  . bisacodyl (DULCOLAX) 10 MG suppository If not relieved by MOM, give 10 mg Bisacodyl suppositiory rectally X 1 dose in 24 hours as needed (Do not use constipation standing orders for residents with renal failure/CFR less than 30. Contact MD for orders) (Physician Order)  . dasatinib (SPRYCEL) 20 MG tablet Take 50 mg by mouth daily. Take 2.5 tablets to = 50 mg  . ergocalciferol (VITAMIN D2) 1.25 MG (50000 UT) capsule Take 1 capsule (50,000 Units total) by mouth once a week. FOR VITAMIN D DEFICIENCY (DO NOT CRUSH)  . finasteride (PROSCAR) 5 MG tablet Take 5 mg by mouth daily.  . furosemide (LASIX) 20 MG tablet Take 20 mg by mouth daily.  . insulin glargine, 1 Unit Dial, (TOUJEO SOLOSTAR) 300 UNIT/ML Solostar Pen Inject 35 Units into the skin at bedtime.  Marland Kitchen loperamide (IMODIUM A-D) 2 MG tablet Take 4 mg by mouth as needed for diarrhea or loose stools. Take 4 mg after 1st loose stool  . losartan (COZAAR) 25 MG tablet Take 1 tablet (25 mg total) by mouth daily.  . magnesium hydroxide (MILK OF MAGNESIA) 400 MG/5ML suspension If no BM in 3 days, give 30 cc Milk of Magnesium p.o. x 1 dose in 24 hours as needed (Do not use standing constipation orders for residents with renal failure CFR less than 30. Contact MD for orders) (Physician Order)  . melatonin 3 MG TABS tablet Take 3 mg by mouth at bedtime.  . metoprolol succinate (TOPROL-XL) 50 MG 24 hr tablet Take 50 mg by mouth daily. Take with or immediately following a meal.  . nitroGLYCERIN (NITROSTAT) 0.4 MG SL tablet Place 0.4 mg under the tongue every 5 (five) minutes as needed for chest pain.  . Nutritional Supplement LIQD Take 120 mLs by mouth in the morning and at bedtime. MedPass  . ondansetron (ZOFRAN) 4 MG tablet Take 4 mg by mouth every 6 (six) hours as needed for  nausea or vomiting.  . Sodium Phosphates (RA SALINE ENEMA RE) If not relieved by Biscodyl suppository, give disposable Saline Enema rectally X 1 dose/24 hrs as needed (Do not use constipation standing orders for residents with renal failure/CFR less than 30. Contact MD for orders)(Physician Or  . tamsulosin (FLOMAX) 0.4 MG CAPS capsule Take 1 capsule (0.4 mg total) by mouth daily.   No facility-administered encounter medications on file as of 12/19/2019.    Review of Systems  GENERAL: No change in appetite, no fatigue, no weight changes, no fever, chills or weakness MOUTH and THROAT: Denies oral discomfort, gingival pain or bleeding RESPIRATORY: no cough, SOB, DOE, wheezing, hemoptysis CARDIAC: No chest pain, edema or palpitations GI: No abdominal pain, diarrhea, constipation, heart burn, nausea or vomiting GU: Denies dysuria, frequency, hematuria,  incontinence, or discharge NEUROLOGICAL: Denies dizziness, syncope, numbness, or headache PSYCHIATRIC: Denies feelings of depression or anxiety. No report of hallucinations, insomnia, paranoia, or agitation   Immunization History  Administered Date(s) Administered  . Influenza, Seasonal, Injecte, Preservative Fre 08/21/2012  . Influenza-Unspecified 08/21/2012  . Pneumococcal Conjugate-13 02/28/2015, 11/25/2015  . Pneumococcal Polysaccharide-23 08/26/2016   Pertinent  Health Maintenance Due  Topic Date Due  . FOOT EXAM  Never done  . OPHTHALMOLOGY EXAM  Never done  . HEMOGLOBIN A1C  11/17/2019  . INFLUENZA VACCINE  03/16/2020  . PNA vac Low Risk Adult  Completed    Vitals:   12/19/19 0839  BP: 128/63  Pulse: 65  Resp: 18  Temp: 97.7 F (36.5 C)  TempSrc: Oral  Weight: 156 lb 6.4 oz (70.9 kg)  Height: _0  (1.854 m)   Body mass index is 20.63 kg/m.  Physical Exam  GENERAL APPEARANCE: Well nourished. In no acute distress. Normal body habitus SKIN:  Skin is warm and dry.  MOUTH and THROAT: Lips are without lesions. Oral  mucosa is moist and without lesions. Tongue is normal in shape, size, and color and without lesions RESPIRATORY: Breathing is even & unlabored, BS CTAB CARDIAC: RRR, no murmur,no extra heart sounds, no edema GI: Abdomen soft, normal BS, no masses, no tenderness EXTREMITIES:  Able to move X 4 extremities NEUROLOGICAL: There is no tremor. Speech is clear. Alert and oriented X 3. PSYCHIATRIC:  Affect and behavior are appropriate  Labs reviewed: Recent Labs    04/16/19 2027  NA 137  K 3.3*  CL 104  CO2 23  GLUCOSE 171*  BUN 17  CREATININE 0.96  CALCIUM 9.0   Recent Labs    04/16/19 2027  AST 19  ALT 14  ALKPHOS 71  BILITOT 0.5  PROT 7.6  ALBUMIN 3.7   Recent Labs    04/16/19 2027  WBC 7.6  NEUTROABS 4.4  HGB 11.2*  HCT 33.4*  MCV 82.1  PLT 211   Lab Results  Component Value Date   TSH 2.322 07/28/2018   Lab Results  Component Value Date   HGBA1C 7.6 05/19/2019    Assessment/Plan  1. Chronic diastolic heart failure (HCC) -  Continue Lasix and Metoprolol - weigh daily, notify provider for weight gain of 3 lbs in a day and 5 lbs in a week  2. Essential (primary) hypertension - controlled, continue Losartan  3. Benign fibroma of prostate - continue Tamsulosin and Finasteride  4. 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 Type 2 diabetes mellitus with hyperglycemia, with long-term current use of insulin (HCC) Lab Results  Component Value Date   HGBA1C 7.6 05/19/2019   -  Stable, continue Toujeo -  monitor CBGs BID  6. Hyperlipidemia LDL goal <100 - continue Atorvastatin     Family/ staff Communication: Discussed plan of care with resident and charge nurse.  Labs/tests ordered: None  Goals of care:   Long-term care   Durenda Age, DNP, FNP-BC [facility] and Adult Medicine [phone] (Monday-Friday 8:00 a.m. - 5:00 p.m.)  [phone] (after hours)

## 2019-12-24 ENCOUNTER — Encounter: Payer: Self-pay | Admitting: Adult Health

## 2019-12-24 ENCOUNTER — Non-Acute Institutional Stay (SKILLED_NURSING_FACILITY): Payer: Medicare Other | Admitting: Adult Health

## 2019-12-24 DIAGNOSIS — I2510 Atherosclerotic heart disease of native coronary artery without angina pectoris: Secondary | ICD-10-CM

## 2019-12-24 DIAGNOSIS — I10 Essential (primary) hypertension: Secondary | ICD-10-CM

## 2019-12-24 DIAGNOSIS — I5032 Chronic diastolic (congestive) heart failure: Secondary | ICD-10-CM

## 2019-12-24 DIAGNOSIS — F5101 Primary insomnia: Secondary | ICD-10-CM

## 2019-12-24 DIAGNOSIS — N400 Enlarged prostate without lower urinary tract symptoms: Secondary | ICD-10-CM

## 2019-12-24 DIAGNOSIS — Z794 Long term (current) use of insulin: Secondary | ICD-10-CM

## 2019-12-24 DIAGNOSIS — E1165 Type 2 diabetes mellitus with hyperglycemia: Secondary | ICD-10-CM | POA: Diagnosis not present

## 2019-12-24 DIAGNOSIS — C9010 Plasma cell leukemia not having achieved remission: Secondary | ICD-10-CM | POA: Diagnosis not present

## 2019-12-24 DIAGNOSIS — E785 Hyperlipidemia, unspecified: Secondary | ICD-10-CM

## 2019-12-24 MED ORDER — LOSARTAN POTASSIUM 25 MG PO TABS: 25.0000 mg | ORAL_TABLET | Freq: Every day | ORAL | 0 refills | Status: AC

## 2019-12-24 MED ORDER — ERGOCALCIFEROL 1.25 MG (50000 UT) PO CAPS: 50000.0000 [IU] | ORAL_CAPSULE | ORAL | 0 refills | Status: AC

## 2019-12-24 MED ORDER — ATORVASTATIN CALCIUM 80 MG PO TABS: 80.0000 mg | ORAL_TABLET | Freq: Every day | ORAL | 0 refills | Status: DC

## 2019-12-24 MED ORDER — MELATONIN 3 MG PO TABS: 3.0000 mg | ORAL_TABLET | Freq: Every day | ORAL | 0 refills | Status: AC

## 2019-12-24 MED ORDER — DASATINIB 20 MG PO TABS: 20.0000 mg | ORAL_TABLET | Freq: Every day | ORAL | 0 refills | Status: DC

## 2019-12-24 MED ORDER — TAMSULOSIN HCL 0.4 MG PO CAPS: 0.4000 mg | ORAL_CAPSULE | Freq: Every day | ORAL | 0 refills | Status: AC

## 2019-12-24 MED ORDER — AZELASTINE HCL 0.1 % NA SOLN: 1.0000 | Freq: Two times a day (BID) | NASAL | 0 refills | Status: AC | PRN

## 2019-12-24 MED ORDER — METOPROLOL SUCCINATE ER 50 MG PO TB24: 50.0000 mg | ORAL_TABLET | Freq: Every day | ORAL | 0 refills | Status: AC

## 2019-12-24 MED ORDER — NITROGLYCERIN 0.4 MG SL SUBL: 0.4000 mg | SUBLINGUAL_TABLET | SUBLINGUAL | 0 refills | Status: AC | PRN

## 2019-12-24 MED ORDER — TOUJEO SOLOSTAR 300 UNIT/ML ~~LOC~~ SOPN: 35.0000 [IU] | PEN_INJECTOR | Freq: Every day | SUBCUTANEOUS | 1 refills | Status: DC

## 2019-12-24 MED ORDER — FINASTERIDE 5 MG PO TABS: 5.0000 mg | ORAL_TABLET | Freq: Every day | ORAL | 0 refills | Status: AC

## 2019-12-24 MED ORDER — FUROSEMIDE 20 MG PO TABS: 20.0000 mg | ORAL_TABLET | Freq: Every day | ORAL | 0 refills | Status: AC

## 2019-12-24 NOTE — Progress Notes (Addendum)
Location:  [place] Room Number: [place] of Service:  SNF (31) Provider:  Durenda Age, DNP, FNP-BC  Patient Care Team: Beckie Salts, MD as PCP - General (Internal Medicine)  Extended Emergency Contact Information Primary Emergency Contact: Govan,[name] Address: [place], [place] [address] [place] Phone: [phone] Mobile Phone: [phone] Relation: Son Secondary Emergency Contact: [name] [place] Mobile Phone: [phone] Relation: Other  Code Status:  Full Code  Goals of care: Advanced Directive information Advanced Directives 05/15/2019  Does Patient Have a Medical Advance Directive? Yes  Type of Advance Directive Out of facility DNR (pink MOST or yellow form)  Does patient want to make changes to medical advance directive? No - Patient declined  Would patient like information on creating a medical advance directive? -  Pre-existing out of facility DNR order (yellow form or pink MOST form) -     Chief Complaint  Patient presents with  . Discharge Note    for possible discharge home on 12/25/19    HPI:  Pt is a 79 y.o. male seen today for possible discharge home on 12/25/19 with [place], OT, Social Worker for [employer] and Nursing for medication management.  He was admitted to [facility] on 12/05/19 post hospitalization 12/01/19 to 12/05/19 t [facility] for CHF exacerbation. He was diuresed and improved symptomatically. He has PMH of CHF, diabetes mellitus, BPH and hyperlipidemia.  Family has earlier wanted resident to stay at [facility] as long-term resident but has now changed their mind to discharge resident to his home. Family was advised for resident to have 24/7 care. He scored 15/15 on BIMS, which is normal, and 17/30 on SLUMS (dementia range is 0-20). He had his COVID-19 vaccine today on his left deltoid.  No adverse reaction has been noted.  Addendum 12/31/19  Family/son decided for resident to stay at [facility] for now.    Past Medical History:  Diagnosis Date  . Diabetes mellitus with neuropathy ([place])   . Hyperlipidemia   . Hypertension   . Leukemia ([place])    on Sprycel  . MRSA infection 09/19/2015   left arm  . Urinary retention 01/11/2014   Past Surgical History:  Procedure Laterality Date  . APPENDECTOMY    . BACK SURGERY    . CHOLECYSTECTOMY    . EYE SURGERY    . INCISE AND DRAIN ABCESS Left 09/16/2015   forearm; debscess undere MAC; [facility]; Dr Curley Spice  . INCISION AND DRAINAGE ABSCESS Left 09/19/15   forearm wound washout with pulavac at [facility] by Dr Curley Spice    Allergies  Allergen Reactions  . Influenza Vaccines Hives  . Aspirin Other (See Comments)    DIZZINESS, UNCONSCIOUSNESS 09/16/15 pt takes 81 mg aspirin at home.[name], RPh  . Cyclobenzaprine Hives  . Dextromethorphan Other (See Comments)    Unknown   . Doxycycline Nausea Only  . Flu Virus Vaccine   . Gabapentin Other (See Comments)    GI UPSET,DROWSINESS  . Glipizide Other (See Comments)    DIZZINESS  . Hydrocodone-Acetaminophen Other (See Comments)    Unknown   . Iodinated Diagnostic Agents Other (See Comments)    Unknown  Unknown   . Latex Other (See Comments)    Unknown   . Lisinopril Other (See Comments)    COUGH  . Pregabalin Other (See Comments)  GI UPSET,DROWSINESS  . Ropinirole Nausea Only  . Salicylates Other (See Comments)    Unknown   . Sulfamethoxazole-Trimethoprim Nausea Only  . Oxycodone-Acetaminophen Hives and Nausea Only  . Penicillins Hives, Itching and Rash    Has patient had a PCN reaction causing immediate rash, facial/tongue/throat swelling, SOB or lightheadedness with hypotension: Yes Has patient had a PCN reaction causing severe rash involving mucus membranes or skin necrosis: No Has patient had a PCN reaction that  required hospitalization: No Has patient had a PCN reaction occurring within the last 10 years: No If all of the above answers are "NO", then may proceed with Cephalosporin use.     Outpatient Encounter Medications as of 12/24/2019  Medication Sig  . acetaminophen (TYLENOL) 325 MG tablet Take 325 mg by mouth every 8 (eight) hours as needed for mild pain, moderate pain or headache.   Marland Kitchen acetaminophen (TYLENOL) 325 MG tablet Take by mouth in the morning and at bedtime.  Marland Kitchen atorvastatin (LIPITOR) 80 MG tablet Take 1 tablet (80 mg total) by mouth daily.  Marland Kitchen azelastine (ASTELIN) 0.1 % nasal spray Place 1 spray into both nostrils 2 (two) times daily as needed for rhinitis or allergies.  . bisacodyl (DULCOLAX) 10 MG suppository If not relieved by MOM, give 10 mg Bisacodyl suppositiory rectally X 1 dose in 24 hours as needed (Do not use constipation standing orders for residents with renal failure/CFR less than 30. Contact MD for orders) (Physician Order)  . ergocalciferol (VITAMIN D2) 1.25 MG (50000 UT) capsule Take 1 capsule (50,000 Units total) by mouth once a week. FOR VITAMIN D DEFICIENCY (DO NOT CRUSH)  . finasteride (PROSCAR) 5 MG tablet Take 1 tablet (5 mg total) by mouth daily.  . furosemide (LASIX) 20 MG tablet Take 1 tablet (20 mg total) by mouth daily.  . insulin glargine, 1 Unit Dial, (TOUJEO SOLOSTAR) 300 UNIT/ML Solostar Pen Inject 35 Units into the skin at bedtime.  Marland Kitchen loperamide (IMODIUM A-D) 2 MG tablet Take 4 mg by mouth as needed for diarrhea or loose stools. Take 4 mg after 1st loose stool  . losartan (COZAAR) 25 MG tablet Take 1 tablet (25 mg total) by mouth daily.  . magnesium hydroxide (MILK OF MAGNESIA) 400 MG/5ML suspension If no BM in 3 days, give 30 cc Milk of Magnesium p.o. x 1 dose in 24 hours as needed (Do not use standing constipation orders for residents with renal failure CFR less than 30. Contact MD for orders) (Physician Order)  . melatonin 3 MG TABS tablet Take 1 tablet (3  mg total) by mouth at bedtime.  . metoprolol succinate (TOPROL-XL) 50 MG 24 hr tablet Take 1 tablet (50 mg total) by mouth daily. Take with or immediately following a meal.  . nitroGLYCERIN (NITROSTAT) 0.4 MG SL tablet Place 1 tablet (0.4 mg total) under the tongue every 5 (five) minutes as needed for chest pain.  . Nutritional Supplement LIQD Take 120 mLs by mouth in the morning and at bedtime. MedPass  . ondansetron (ZOFRAN) 4 MG tablet Take 4 mg by mouth every 6 (six) hours as needed for nausea or vomiting.  . Sodium Phosphates (RA SALINE ENEMA RE) If not relieved by Biscodyl suppository, give disposable Saline Enema rectally X 1 dose/24 hrs as needed (Do not use constipation standing orders for residents with renal failure/CFR less than 30. Contact MD for orders)(Physician Or  . tamsulosin (FLOMAX) 0.4 MG CAPS capsule Take 1 capsule (0.4 mg total) by mouth daily.  . [  DISCONTINUED] atorvastatin (LIPITOR) 80 MG tablet Take 1 tablet (80 mg total) by mouth daily.  . [DISCONTINUED] azelastine (ASTELIN) 0.1 % nasal spray Place 1 spray into both nostrils 2 (two) times daily as needed for rhinitis or allergies.  . [DISCONTINUED] dasatinib (SPRYCEL) 20 MG tablet Take 50 mg by mouth daily. Take 2.5 tablets to = 50 mg  . [DISCONTINUED] dasatinib (SPRYCEL) 20 MG tablet Take 1 tablet (20 mg total) by mouth daily. Take 2.5 tablets to = 50 mg  . [DISCONTINUED] ergocalciferol (VITAMIN D2) 1.25 MG (50000 UT) capsule Take 1 capsule (50,000 Units total) by mouth once a week. FOR VITAMIN D DEFICIENCY (DO NOT CRUSH)  . [DISCONTINUED] finasteride (PROSCAR) 5 MG tablet Take 5 mg by mouth daily.  . [DISCONTINUED] furosemide (LASIX) 20 MG tablet Take 20 mg by mouth daily.  . [DISCONTINUED] insulin glargine, 1 Unit Dial, (TOUJEO SOLOSTAR) 300 UNIT/ML Solostar Pen Inject 35 Units into the skin at bedtime.  . [DISCONTINUED] losartan (COZAAR) 25 MG tablet Take 1 tablet (25 mg total) by mouth daily.  . [DISCONTINUED]  melatonin 3 MG TABS tablet Take 3 mg by mouth at bedtime.  . [DISCONTINUED] metoprolol succinate (TOPROL-XL) 50 MG 24 hr tablet Take 50 mg by mouth daily. Take with or immediately following a meal.  . [DISCONTINUED] nitroGLYCERIN (NITROSTAT) 0.4 MG SL tablet Place 0.4 mg under the tongue every 5 (five) minutes as needed for chest pain.  . [DISCONTINUED] tamsulosin (FLOMAX) 0.4 MG CAPS capsule Take 1 capsule (0.4 mg total) by mouth daily.   No facility-administered encounter medications on file as of 12/24/2019.    Review of Systems  GENERAL: No change in appetite, no fatigue, no weight changes, no fever, chills or weakness MOUTH and THROAT: Denies oral discomfort, gingival pain or bleeding, pain from teeth or hoarseness   RESPIRATORY: no cough, SOB, DOE, wheezing, hemoptysis CARDIAC: No chest pain, edema or palpitations GI: No abdominal pain, diarrhea, constipation, heart burn, nausea or vomiting GU: Denies dysuria, frequency, hematuria, incontinence, or discharge NEUROLOGICAL: Denies dizziness, syncope, numbness, or headache PSYCHIATRIC: Denies feelings of depression or anxiety. No report of hallucinations, insomnia, paranoia, or agitation   Immunization History  Administered Date(s) Administered  . Influenza, Seasonal, Injecte, Preservative Fre 08/21/2012  . Influenza-Unspecified 08/21/2012  . Pneumococcal Conjugate-13 02/28/2015, 11/25/2015  . Pneumococcal Polysaccharide-23 08/26/2016   Pertinent  Health Maintenance Due  Topic Date Due  . FOOT EXAM  Never done  . OPHTHALMOLOGY EXAM  Never done  . HEMOGLOBIN A1C  11/17/2019  . INFLUENZA VACCINE  03/16/2020  . PNA vac Low Risk Adult  Completed   No flowsheet data found.   Vitals:   12/24/19 1610  BP: 122/76  Pulse: 80  Resp: 18  Temp: 98.4 F (36.9 C)  Weight: 156 lb 9.6 oz (71 kg)  Height: _0  (1.854 m)   Body mass index is 20.66 kg/m.  Physical Exam  GENERAL APPEARANCE: Well nourished. In no acute distress.  Normal body habitus SKIN:  Skin is warm and dry.  MOUTH and THROAT: Lips are without lesions. Oral mucosa is moist and without lesions. Tongue is normal in shape, size, and color and without lesions RESPIRATORY: Breathing is even & unlabored, BS CTAB CARDIAC: RRR, no murmur,no extra heart sounds, no edema GI: Abdomen soft, normal BS, no masses, no tenderness EXTREMITIES:  Able to move X 4 extremities NEUROLOGICAL: There is no tremor. Speech is clear. Alert and oriented X 3. PSYCHIATRIC:  Affect and behavior are appropriate  Labs  reviewed: Recent Labs    04/16/19 2027  NA 137  K 3.3*  CL 104  CO2 23  GLUCOSE 171*  BUN 17  CREATININE 0.96  CALCIUM 9.0   Recent Labs    04/16/19 2027  AST 19  ALT 14  ALKPHOS 71  BILITOT 0.5  PROT 7.6  ALBUMIN 3.7   Recent Labs    04/16/19 2027  WBC 7.6  NEUTROABS 4.4  HGB 11.2*  HCT 33.4*  MCV 82.1  PLT 211   Lab Results  Component Value Date   TSH 2.322 07/28/2018   Lab Results  Component Value Date   HGBA1C 7.6 05/19/2019    Assessment/Plan  1. Chronic diastolic heart failure ([place]) - recently followed up with cardiology, Dr. Sherryle Lis, on 12/17/19,  who has ordered support stockings and to prop legs up when sitting and to follow up in 3 months, 02/15/20 at 11:40 AM - metoprolol succinate (TOPROL-XL) 50 MG 24 hr tablet; Take 1 tablet (50 mg total) by mouth daily. Take with or immediately following a meal.  Dispense: 30 tablet; Refill: 0 - furosemide (LASIX) 20 MG tablet; Take 1 tablet (20 mg total) by mouth daily.  Dispense: 30 tablet; Refill: 0  2. Type 2 diabetes mellitus with hyperglycemia, with long-term current use of insulin (HCC) Lab Results  Component Value Date   HGBA1C 7.6 05/19/2019   - insulin glargine, 1 Unit Dial, (TOUJEO SOLOSTAR) 300 UNIT/ML Solostar Pen; Inject 35 Units into the skin at bedtime.  Dispense: 1.5 mL; Refill: 1  3. Benign fibroma of prostate - tamsulosin (FLOMAX) 0.4 MG CAPS capsule;  Take 1 capsule (0.4 mg total) by mouth daily.  Dispense: 30 capsule; Refill: 0 - finasteride (PROSCAR) 5 MG tablet; Take 1 tablet (5 mg total) by mouth daily.  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 Take 2.5 tablets to = 50 mg  Dispense: 30 tablet; Refill: 0  5. Essential (primary) hypertension - metoprolol succinate (TOPROL-XL) 50 MG 24 hr tablet; Take 1 tablet (50 mg total) by mouth daily. Take with or immediately following a meal.  Dispense: 30 tablet; Refill: 0 - losartan (COZAAR) 25 MG tablet; Take 1 tablet (25 mg total) by mouth daily.  Dispense: 30 tablet; Refill: 0  6. Hyperlipidemia LDL goal <100 - atorvastatin (LIPITOR) 80 MG tablet; Take 1 tablet (80 mg total) by mouth daily.  Dispense: 30 tablet; Refill: 0  7. Primary insomnia - melatonin 3 MG TABS tablet; Take 1 tablet (3 mg total) by mouth at bedtime.  Dispense: 30 tablet; Refill: 0  8. CAD in native artery - nitroGLYCERIN (NITROSTAT) 0.4 MG SL tablet; Place 1 tablet (0.4 mg total) under the tongue every 5 (five) minutes as needed for chest pain.  Dispense: 30 tablet; Refill: 0 - atorvastatin (LIPITOR) 80 MG tablet; Take 1 tablet (80 mg total) by mouth daily.  Dispense: 30 tablet; Refill: 0    I have filled out patient's discharge paperwork and e-prescribed medications.  Patient will have home health PT, OT, Social Worker for [employer] and Nursing for medication management.  Addendum 12/31/19:   Family/son decided for resident to stay at the facility for now.     Durenda Age, DNP, FNP-BC [facility] and Adult Medicine [phone] (Monday-Friday 8:00 a.m. - 5:00 p.m.) [phone] (after hours)

## 2019-12-25 ENCOUNTER — Other Ambulatory Visit: Payer: Self-pay | Admitting: Adult Health

## 2019-12-25 ENCOUNTER — Telehealth: Payer: Self-pay

## 2019-12-25 DIAGNOSIS — C9010 Plasma cell leukemia not having achieved remission: Secondary | ICD-10-CM

## 2019-12-25 MED ORDER — DASATINIB 20 MG PO TABS: 50.0000 mg | ORAL_TABLET | Freq: Every day | ORAL | 0 refills | Status: DC

## 2019-12-25 NOTE — Telephone Encounter (Signed)
I called and spoke with pharmacy tech Fritz Pickerel regarding the direction of how patients Dasatinib (Sprycel) should be taken when I asked [place] she said it was 50 mg per day not the 20 mg

## 2019-12-31 ENCOUNTER — Encounter: Payer: Self-pay | Admitting: Adult Health

## 2019-12-31 ENCOUNTER — Non-Acute Institutional Stay (SKILLED_NURSING_FACILITY): Payer: Medicare Other | Admitting: Adult Health

## 2019-12-31 DIAGNOSIS — R451 Restlessness and agitation: Secondary | ICD-10-CM

## 2019-12-31 NOTE — Addendum Note (Signed)
Addended by: Durenda Age C on: 12/31/2019 02:18 PM   Modules accepted: Level of Service

## 2019-12-31 NOTE — Progress Notes (Signed)
Location:  [facility] Room Number: 113-A Place of Service:  SNF (31) Provider:  Durenda Age, DNP, FNP-BC  Patient Care Team: Beckie Salts, MD as PCP - General (Internal Medicine)  Extended Emergency Contact Information Primary Emergency Contact: Kohlbeck,[name] Address: [place], [place] [address] [name] Phone: [phone] Mobile Phone: [phone] Relation: Son Secondary Emergency Contact: [name] [place] of [place] Mobile Phone: [phone] Relation: Other  Code Status:  FULL CODE  Goals of care: Advanced Directive information Advanced Directives 05/15/2019  Does Patient Have a Medical Advance Directive? Yes  Type of Advance Directive Out of facility DNR (pink MOST or yellow form)  Does patient want to make changes to medical advance directive? No - Patient declined  Would patient like information on creating a medical advance directive? -  Pre-existing out of facility DNR order (yellow form or pink MOST form) -     Chief Complaint  Patient presents with  . Acute Visit    Patient is seen for agitation.     HPI:  Pt is a 79 y.o. male seen for agitation. He is currently having a short-term rehabilitation after a recent hospitalization. "I am not happy and I will go home." He denies having pain. No reported fever nor dysuria.   He was admitted to [facility] on 12/05/19 post [facility] hospitalization 12/01/19 to 12/05/19 for CHF exacerbation.  He was diuresed and improved symptomatically.  He has PMH of CHF, diabetes mellitus, BPH and hyperlipidemia.  Past Medical History:  Diagnosis Date  . Diabetes mellitus with neuropathy ([place])   . Hyperlipidemia   . Hypertension   . Leukemia ([place])    on Sprycel  . MRSA infection 09/19/2015   left arm  . Urinary retention 01/11/2014   Past Surgical History:  Procedure Laterality Date  . APPENDECTOMY    . BACK SURGERY    .  CHOLECYSTECTOMY    . EYE SURGERY    . INCISE AND DRAIN ABCESS Left 09/16/2015   forearm; debscess undere MAC; [facility]; Dr Curley Spice  . INCISION AND DRAINAGE ABSCESS Left 09/19/15   forearm wound washout with pulavac at [facility] by Dr Curley Spice    Allergies  Allergen Reactions  . Influenza Vaccines Hives  . Aspirin Other (See Comments)    DIZZINESS, UNCONSCIOUSNESS 09/16/15 pt takes 81 mg aspirin at home.[name], RPh  . Cyclobenzaprine Hives  . Dextromethorphan Other (See Comments)    Unknown   . Doxycycline Nausea Only  . Flu Virus Vaccine   . Gabapentin Other (See Comments)    GI UPSET,DROWSINESS  . Glipizide Other (See Comments)    DIZZINESS  . Hydrocodone-Acetaminophen Other (See Comments)    Unknown   . Iodinated Diagnostic Agents Other (See Comments)    Unknown  Unknown   . Latex Other (See Comments)    Unknown   . Lisinopril Other (See Comments)    COUGH  . Pregabalin Other (See Comments)    GI UPSET,DROWSINESS  . Ropinirole Nausea Only  . Salicylates Other (See Comments)    Unknown   . Sulfamethoxazole-Trimethoprim Nausea Only  . Oxycodone-Acetaminophen Hives and Nausea Only  . Penicillins Hives, Itching and Rash    Has patient had a PCN reaction causing immediate rash, facial/tongue/throat swelling, SOB or lightheadedness with hypotension: Yes Has patient had a PCN reaction causing severe rash involving mucus membranes or skin necrosis: No Has patient  had a PCN reaction that required hospitalization: No Has patient had a PCN reaction occurring within the last 10 years: No If all of the above answers are "NO", then may proceed with Cephalosporin use.     Outpatient Encounter Medications as of 12/31/2019  Medication Sig  . acetaminophen (TYLENOL) 325 MG tablet Take 325 mg by mouth every 8 (eight) hours.   Marland Kitchen acetaminophen (TYLENOL) 325 MG tablet Take by mouth in the morning and at bedtime.  Marland Kitchen atorvastatin (LIPITOR) 80 MG tablet Take 1 tablet (80  mg total) by mouth daily.  Marland Kitchen azelastine (ASTELIN) 0.1 % nasal spray Place 1 spray into both nostrils 2 (two) times daily as needed for rhinitis or allergies.  . bisacodyl (DULCOLAX) 10 MG suppository If not relieved by MOM, give 10 mg Bisacodyl suppositiory rectally X 1 dose in 24 hours as needed (Do not use constipation standing orders for residents with renal failure/CFR less than 30. Contact MD for orders) (Physician Order)  . dasatinib (SPRYCEL) 20 MG tablet Take 2.5 tablets (50 mg total) by mouth daily. Take 2.5 tablets to = 50 mg  . ergocalciferol (VITAMIN D2) 1.25 MG (50000 UT) capsule Take 1 capsule (50,000 Units total) by mouth once a week. FOR VITAMIN D DEFICIENCY (DO NOT CRUSH)  . finasteride (PROSCAR) 5 MG tablet Take 1 tablet (5 mg total) by mouth daily.  . furosemide (LASIX) 20 MG tablet Take 1 tablet (20 mg total) by mouth daily.  . insulin glargine, 1 Unit Dial, (TOUJEO SOLOSTAR) 300 UNIT/ML Solostar Pen Inject 35 Units into the skin at bedtime.  Marland Kitchen loperamide (IMODIUM A-D) 2 MG tablet Take 4 mg by mouth as needed for diarrhea or loose stools. Take 4 mg after 1st loose stool  . losartan (COZAAR) 25 MG tablet Take 1 tablet (25 mg total) by mouth daily.  . magnesium hydroxide (MILK OF MAGNESIA) 400 MG/5ML suspension If no BM in 3 days, give 30 cc Milk of Magnesium p.o. x 1 dose in 24 hours as needed (Do not use standing constipation orders for residents with renal failure CFR less than 30. Contact MD for orders) (Physician Order)  . melatonin 3 MG TABS tablet Take 1 tablet (3 mg total) by mouth at bedtime.  . metoprolol succinate (TOPROL-XL) 50 MG 24 hr tablet Take 1 tablet (50 mg total) by mouth daily. Take with or immediately following a meal.  . nitroGLYCERIN (NITROSTAT) 0.4 MG SL tablet Place 1 tablet (0.4 mg total) under the tongue every 5 (five) minutes as needed for chest pain.  . Nutritional Supplement LIQD Take 120 mLs by mouth in the morning and at bedtime. MedPass  .  ondansetron (ZOFRAN) 4 MG tablet Take 4 mg by mouth every 6 (six) hours as needed for nausea or vomiting.  . Sodium Phosphates (RA SALINE ENEMA RE) If not relieved by Biscodyl suppository, give disposable Saline Enema rectally X 1 dose/24 hrs as needed (Do not use constipation standing orders for residents with renal failure/CFR less than 30. Contact MD for orders)(Physician Or  . tamsulosin (FLOMAX) 0.4 MG CAPS capsule Take 1 capsule (0.4 mg total) by mouth daily.   No facility-administered encounter medications on file as of 12/31/2019.    Review of Systems  GENERAL: No change in appetite, no fatigue, no fever, chills or weakness MOUTH and THROAT: Denies oral discomfort, gingival pain RESPIRATORY: no cough, SOB, DOE, wheezing, hemoptysis CARDIAC: No chest pain, edema or palpitations GI: No abdominal pain, diarrhea, constipation, heart burn, nausea or vomiting  GU: Denies dysuria, frequency, hematuria, incontinence, or discharge NEUROLOGICAL: Denies dizziness, syncope, numbness, or headache PSYCHIATRIC: Agitated   Immunization History  Administered Date(s) Administered  . Influenza, Seasonal, Injecte, Preservative Fre 08/21/2012  . Influenza-Unspecified 08/21/2012  . Pneumococcal Conjugate-13 02/28/2015, 11/25/2015  . Pneumococcal Polysaccharide-23 08/26/2016   Pertinent  Health Maintenance Due  Topic Date Due  . FOOT EXAM  Never done  . OPHTHALMOLOGY EXAM  Never done  . INFLUENZA VACCINE  03/16/2020  . HEMOGLOBIN A1C  07/04/2020  . PNA vac Low Risk Adult  Completed   No flowsheet data found.   Vitals:   12/31/19 1603  BP: (!) 100/52  Pulse: 84  Resp: 18  Temp: (!) 96.9 F (36.1 C)  TempSrc: Oral  Weight: 156 lb 11.2 oz (71.1 kg)  Height: _0  (1.854 m)   Body mass index is 20.67 kg/m.  Physical Exam  GENERAL APPEARANCE: Well nourished. In no acute distress. Normal body habitus SKIN:  Skin is warm and dry.  MOUTH and THROAT: Lips are without lesions. Oral mucosa  is moist and without lesions. Tongue is normal in shape, size, and color and without lesions RESPIRATORY: Breathing is even & unlabored, BS CTAB CARDIAC: RRR, no murmur,no extra heart sounds, no edema GI: Abdomen soft, normal BS, no masses, no tenderness NEUROLOGICAL: There is no tremor. Speech is clear PSYCHIATRIC:  Agitated Care this is melena Labs reviewed: Recent Labs    04/16/19 2027 01/02/20 0000  NA 137 136*  K 3.3* 4.9  CL 104 103  CO2 23 19  GLUCOSE 171*  --   BUN 17 86*  CREATININE 0.96 1.8*  CALCIUM 9.0 9.0   Recent Labs    04/16/19 2027 01/02/20 0000  AST 19 36  ALT 14 42*  ALKPHOS 71 93  BILITOT 0.5  --   PROT 7.6  --   ALBUMIN 3.7 3.8   Recent Labs    04/16/19 2027 01/02/20 0000  WBC 7.6 7.0  NEUTROABS 4.4 3  HGB 11.2* 9.6*  HCT 33.4* 29*  MCV 82.1  --   PLT 211 172   Lab Results  Component Value Date   TSH 5.20 01/02/2020   Lab Results  Component Value Date   HGBA1C 7.6 01/02/2020    Assessment/Plan  1. Agitation - was supposed to be for discharge but son has decided to keep resident in the facility. Agitation is thought to be from frustration - Encouraged to attend facility activities. - will order labs   Family/ staff Communication: Discussed plan of care with resident and charge nurse.  Labs/tests ordered: CBC, CMP, urinalysis with culture and sensitivity, A1c, vitamin D level and TSH  Goals of care:   Short-term care   Durenda Age, DNP, FNP-BC [facility] [phone] (Monday-Friday 8:00 a.m. - 5:00 p.m.) [phone] (after hours)

## 2020-01-02 LAB — HEMOGLOBIN A1C: Hemoglobin A1C: 7.6

## 2020-01-02 LAB — HEPATIC FUNCTION PANEL
ALT: 42 — AB (ref 10–40)
AST: 36 (ref 14–40)
Alkaline Phosphatase: 93 (ref 25–125)
Bilirubin, Total: 0.3

## 2020-01-02 LAB — BASIC METABOLIC PANEL
BUN: 86 — AB (ref 4–21)
CO2: 19 (ref 13–22)
Chloride: 103 (ref 99–108)
Creatinine: 1.8 — AB (ref 0.6–1.3)
Glucose: 149
Potassium: 4.9 (ref 3.4–5.3)
Sodium: 136 — AB (ref 137–147)

## 2020-01-02 LAB — COMPREHENSIVE METABOLIC PANEL
Albumin: 3.8 (ref 3.5–5.0)
Calcium: 9 (ref 8.7–10.7)
GFR calc Af Amer: 40.52
GFR calc non Af Amer: 34.96
Globulin: 3.5

## 2020-01-02 LAB — CBC AND DIFFERENTIAL
HCT: 29 — AB (ref 41–53)
Hemoglobin: 9.6 — AB (ref 13.5–17.5)
Neutrophils Absolute: 3
Platelets: 172 (ref 150–399)
WBC: 7

## 2020-01-02 LAB — TSH: TSH: 5.2 (ref 0.41–5.90)

## 2020-01-02 LAB — CBC: RBC: 3.6 — AB (ref 3.87–5.11)

## 2020-01-02 LAB — VITAMIN D 25 HYDROXY (VIT D DEFICIENCY, FRACTURES): Vit D, 25-Hydroxy: 30.4

## 2020-01-03 ENCOUNTER — Encounter: Payer: Self-pay | Admitting: Adult Health

## 2020-01-03 ENCOUNTER — Non-Acute Institutional Stay (SKILLED_NURSING_FACILITY): Payer: Medicare Other | Admitting: Adult Health

## 2020-01-03 DIAGNOSIS — R197 Diarrhea, unspecified: Secondary | ICD-10-CM | POA: Diagnosis not present

## 2020-01-03 DIAGNOSIS — I5032 Chronic diastolic (congestive) heart failure: Secondary | ICD-10-CM

## 2020-01-03 DIAGNOSIS — N289 Disorder of kidney and ureter, unspecified: Secondary | ICD-10-CM

## 2020-01-03 LAB — COMPREHENSIVE METABOLIC PANEL
Calcium: 8.9 (ref 8.7–10.7)
GFR calc Af Amer: 39.99
GFR calc non Af Amer: 34.5

## 2020-01-03 LAB — BASIC METABOLIC PANEL
BUN: 88 — AB (ref 4–21)
CO2: 16 (ref 13–22)
Chloride: 105 (ref 99–108)
Creatinine: 1.9 — AB (ref 0.6–1.3)
Glucose: 160
Potassium: 4.9 (ref 3.4–5.3)
Sodium: 136 — AB (ref 137–147)

## 2020-01-03 NOTE — Progress Notes (Signed)
° °Location:  [facility] °Nursing Home Room Number: 113-A °Place of Service:  SNF (31) °Provider:  Medina-Vargas, Monina, DNP, FNP-BC ° °Patient Care Team: °Gassemi, Mike, MD as PCP - General (Internal Medicine) ° °Extended Emergency Contact Information °Primary Emergency Contact: Hartmann,[name] °Address: [address] °         [place], [place] [address] [place] °Home Phone: [phone] °Mobile Phone: [phone] °Relation: Son °Secondary Emergency Contact: Clapsaddle,Denise ° [place] °Mobile Phone: [phone] °Relation: Other ° °Code Status:  FULL CODE ° °Goals of care: Advanced Directive information °Advanced Directives 05/15/2019  °Does Patient Have a Medical Advance Directive? Yes  °Type of Advance Directive Out of facility DNR (pink MOST or yellow form)  °Does patient want to make changes to medical advance directive? No - Patient declined  °Would patient like information on creating a medical advance directive? -  °Pre-existing out of facility DNR order (yellow form or pink MOST form) -  ° ° ° °Chief Complaint  °Patient presents with  °• Acute Visit  °  Patient is seen for elevated BUN  ° ° °HPI:  °Pt is a 79 y.o. male who was reported to have diarrhea yesterday. He was seen in his room and reported that he had diarrhea X 4 yesterday. Labs done showed BUN up from 39 to 85.9, creatinine up from 1.12 to 1.83,  and GFR 34.96. He denies having d.ifficulty urinating.  ° °He was admitted to [facility] on 12/05/19 post [facility] hospitalization 12/01/19 to 12/05/19 for CHF exacerbation. He was diuresed and improved symptomatically. He has PMH of CHF, diabetes mellitus, BPH and hyperlipidemia.  ° °Past Medical History:  °Diagnosis Date  °• Diabetes mellitus with neuropathy ([facility])   °• Hyperlipidemia   °• Hypertension   °• Leukemia ([facility])   ° on Sprycel  °• MRSA infection 09/19/2015  ° left arm  °• Urinary retention 01/11/2014  ° °Past Surgical History:  °Procedure  Laterality Date  °• APPENDECTOMY    °• BACK SURGERY    °• CHOLECYSTECTOMY    °• EYE SURGERY    °• INCISE AND DRAIN ABCESS Left 09/16/2015  ° forearm; debscess undere MAC; [facility]; Dr Anthony Arredondo  °• INCISION AND DRAINAGE ABSCESS Left 09/19/15  ° forearm wound washout with pulavac at [facility] by Dr Anthony Arredondo  ° ° °Allergies  °Allergen Reactions  °• Influenza Vaccines Hives  °• Aspirin Other (See Comments)  °  DIZZINESS, UNCONSCIOUSNESS °09/16/15 pt takes 81 mg aspirin at home..[name], RPh  °• Cyclobenzaprine Hives  °• Dextromethorphan Other (See Comments)  °  Unknown   °• Doxycycline Nausea Only  °• Flu Virus Vaccine   °• Gabapentin Other (See Comments)  °  GI UPSET,DROWSINESS  °• Glipizide Other (See Comments)  °  DIZZINESS  °• Hydrocodone-Acetaminophen Other (See Comments)  °  Unknown   °• Iodinated Diagnostic Agents Other (See Comments)  °  Unknown  °Unknown   °• Latex Other (See Comments)  °  Unknown   °• Lisinopril Other (See Comments)  °  COUGH  °• Pregabalin Other (See Comments)  °  GI UPSET,DROWSINESS  °• Ropinirole Nausea Only  °• Salicylates Other (See Comments)  °  Unknown   °• Sulfamethoxazole-Trimethoprim Nausea Only  °• Oxycodone-Acetaminophen Hives and Nausea Only  °• Penicillins Hives, Itching and Rash  °  Has patient had a PCN reaction causing immediate rash, facial/tongue/throat swelling, SOB or lightheadedness with hypotension: Yes °Has patient had a PCN   reaction causing severe rash involving mucus membranes or skin necrosis: No °Has patient had a PCN reaction that required hospitalization: No °Has patient had a PCN reaction occurring within the last 10 years: No °If all of the above answers are "NO", then may proceed with Cephalosporin use. °  ° ° °Outpatient Encounter Medications as of 01/03/2020  °Medication Sig  °• acetaminophen (TYLENOL) 325 MG tablet Take 325 mg by mouth every 8 (eight) hours.   °• acetaminophen (TYLENOL) 325 MG tablet Take by mouth in the morning and at bedtime.    °• atorvastatin (LIPITOR) 80 MG tablet Take 1 tablet (80 mg total) by mouth daily.  °• azelastine (ASTELIN) 0.1 % nasal spray Place 1 spray into both nostrils 2 (two) times daily as needed for rhinitis or allergies.  °• bisacodyl (DULCOLAX) 10 MG suppository If not relieved by MOM, give 10 mg Bisacodyl suppositiory rectally X 1 dose in 24 hours as needed (Do not use constipation standing orders for residents with renal failure/CFR less than 30. Contact MD for orders) (Physician Order)  °• dasatinib (SPRYCEL) 20 MG tablet Take 2.5 tablets (50 mg total) by mouth daily. Take 2.5 tablets to = 50 mg  °• ergocalciferol (VITAMIN D2) 1.25 MG (50000 UT) capsule Take 1 capsule (50,000 Units total) by mouth once a week. FOR VITAMIN D DEFICIENCY (DO NOT CRUSH)  °• finasteride (PROSCAR) 5 MG tablet Take 1 tablet (5 mg total) by mouth daily.  °• furosemide (LASIX) 20 MG tablet Take 1 tablet (20 mg total) by mouth daily.  °• insulin glargine, 1 Unit Dial, (TOUJEO SOLOSTAR) 300 UNIT/ML Solostar Pen Inject 35 Units into the skin at bedtime.  °• loperamide (IMODIUM A-D) 2 MG tablet Take 4 mg by mouth as needed for diarrhea or loose stools. Take 4 mg after 1st loose stool  °• losartan (COZAAR) 25 MG tablet Take 1 tablet (25 mg total) by mouth daily.  °• magnesium hydroxide (MILK OF MAGNESIA) 400 MG/5ML suspension If no BM in 3 days, give 30 cc Milk of Magnesium p.o. x 1 dose in 24 hours as needed (Do not use standing constipation orders for residents with renal failure CFR less than 30. Contact MD for orders) (Physician Order)  °• melatonin 3 MG TABS tablet Take 1 tablet (3 mg total) by mouth at bedtime.  °• metoprolol succinate (TOPROL-XL) 50 MG 24 hr tablet Take 1 tablet (50 mg total) by mouth daily. Take with or immediately following a meal.  °• nitroGLYCERIN (NITROSTAT) 0.4 MG SL tablet Place 1 tablet (0.4 mg total) under the tongue every 5 (five) minutes as needed for chest pain.  °• Nutritional Supplement LIQD Take 120 mLs by  mouth in the morning and at bedtime. MedPass  °• ondansetron (ZOFRAN) 4 MG tablet Take 4 mg by mouth every 6 (six) hours as needed for nausea or vomiting.  °• Sodium Phosphates (RA SALINE ENEMA RE) If not relieved by Biscodyl suppository, give disposable Saline Enema rectally X 1 dose/24 hrs as needed (Do not use constipation standing orders for residents with renal failure/CFR less than 30. Contact MD for orders)(Physician Or  °• tamsulosin (FLOMAX) 0.4 MG CAPS capsule Take 1 capsule (0.4 mg total) by mouth daily.  ° °No facility-administered encounter medications on file as of 01/03/2020.  ° ° °Review of Systems ° °GENERAL: No change in appetite, no fatigue, no weight changes, no fever, chills or weakness °MOUTH and THROAT: Denies oral discomfort, gingival pain or bleeding °CARDIAC: No chest pain, edema or   or palpitations GI: +diarrhea GU: Denies dysuria, frequency, hematuria or discharge NEUROLOGICAL: Denies dizziness, syncope, numbness, or headache PSYCHIATRIC: +Agitation   Immunization History  Administered Date(s) Administered   Influenza, Seasonal, Injecte, Preservative Fre 08/21/2012   Influenza-Unspecified 08/21/2012   Pneumococcal Conjugate-13 02/28/2015, 11/25/2015   Pneumococcal Polysaccharide-23 08/26/2016   Pertinent  Health Maintenance Due  Topic Date Due   FOOT EXAM  Never done   OPHTHALMOLOGY EXAM  Never done   HEMOGLOBIN A1C  11/17/2019   INFLUENZA VACCINE  03/16/2020   PNA vac Low Risk Adult  Completed    Vitals:   01/03/20 1025  BP: 126/64  Pulse: 72  Resp: 18  Temp: (!) 97 F (36.1 C)  TempSrc: Oral  SpO2: 96%  Weight: 158 lb 3.2 oz (71.8 kg)  Height: 6' 1" (1.854 m)   Body mass index is 20.87 kg/m.  Physical Exam  GENERAL APPEARANCE: Well nourished. In no acute distress. Normal body habitus SKIN:  Skin is warm and dry.  MOUTH and THROAT: Lips are without lesions. Oral mucosa is moist and without lesions. Tongue is normal in shape, size, and color  and without lesions RESPIRATORY: Breathing is even & unlabored, BS CTAB CARDIAC: RRR, no murmur,no extra heart sounds, no edema GI: Abdomen soft, normal BS, no masses, no tenderness EXTREMITIES:  Able to move X 4 extremities NEUROLOGICAL: There is no tremor. Speech is clear. PSYCHIATRIC:  Affect and behavior are appropriate  Labs reviewed:  01/02/2020 glucose 149 calcium 9.0 BUN 85.9 creatinine 1.83 sodium 136 K4.9 total bilirubin 0.3 Albumin 3.8 ALT 42 AST 36 alk phos 93 GFR 34.96 WBC 7.0 hemoglobin 9.6 hematocrit 28.7 MCV 79.7 platelet 172 TSH 5.2 hemoglobin A1c 7.6  Recent Labs    04/16/19 2027  NA 137  K 3.3*  CL 104  CO2 23  GLUCOSE 171*  BUN 17  CREATININE 0.96  CALCIUM 9.0   Recent Labs    04/16/19 2027  AST 19  ALT 14  ALKPHOS 71  BILITOT 0.5  PROT 7.6  ALBUMIN 3.7   Recent Labs    04/16/19 2027  WBC 7.6  NEUTROABS 4.4  HGB 11.2*  HCT 33.4*  MCV 82.1  PLT 211   Lab Results  Component Value Date   TSH 2.322 07/28/2018   Lab Results  Component Value Date   HGBA1C 7.6 05/19/2019    Assessment/Plan  1. Acute renal insufficiency - BUN up from 39 to 85.9, creatinine up from 1.12 to 1.83 - he had diarrhea and currently on Lasix 20 mg daily for CHF - will start on 0.9 NS @ 60 ml/hour X 1L and then repeat BMP - refer for nephrology consult  2. Chronic diastolic heart failure ([facility]) - no SOB, continue Lasix 20 mg daily and metoprolol succinate ER 50 mg 1 tab daily  3. Diarrhea, unspecified type - will get stool culture for C-difficile    Family/ staff Communication: Discussed plan of care with resident and charge nurse.  Labs/tests ordered:  Repeat BMP after the IV fluids and stool culture for C-difficile  Goals of care:   Long-term care   Durenda Age, DNP, FNP-BC [facility] [phone] (Monday-Friday 8:00 a.m. - 5:00 p.m.) [phone] (after hours)

## 2020-01-10 LAB — BASIC METABOLIC PANEL
BUN: 65 — AB (ref 4–21)
CO2: 17 (ref 13–22)
Chloride: 108 (ref 99–108)
Creatinine: 1.5 — AB (ref 0.6–1.3)
Glucose: 61
Potassium: 4.1 (ref 3.4–5.3)
Sodium: 136 — AB (ref 137–147)

## 2020-01-10 LAB — HEPATIC FUNCTION PANEL
ALT: 45 — AB (ref 10–40)
AST: 34 (ref 14–40)
Alkaline Phosphatase: 96 (ref 25–125)
Bilirubin, Direct: 0.2 (ref 0.01–0.4)
Bilirubin, Total: 0.4

## 2020-01-10 LAB — COMPREHENSIVE METABOLIC PANEL
Albumin: 4 (ref 3.5–5.0)
Calcium: 9 (ref 8.7–10.7)
GFR calc Af Amer: 51.94
GFR calc non Af Amer: 44.82

## 2020-01-21 ENCOUNTER — Non-Acute Institutional Stay (SKILLED_NURSING_FACILITY): Payer: Medicare Other | Admitting: Adult Health

## 2020-01-21 ENCOUNTER — Encounter: Payer: Self-pay | Admitting: Adult Health

## 2020-01-21 DIAGNOSIS — C9010 Plasma cell leukemia not having achieved remission: Secondary | ICD-10-CM | POA: Diagnosis not present

## 2020-01-21 DIAGNOSIS — N400 Enlarged prostate without lower urinary tract symptoms: Secondary | ICD-10-CM

## 2020-01-21 DIAGNOSIS — I5032 Chronic diastolic (congestive) heart failure: Secondary | ICD-10-CM

## 2020-01-21 DIAGNOSIS — I10 Essential (primary) hypertension: Secondary | ICD-10-CM | POA: Diagnosis not present

## 2020-01-21 NOTE — Progress Notes (Signed)
Location:  [name] Room Number: 113-A Place of Service:  SNF (31) Provider:  Durenda Age, DNP, FNP-BC  Patient Care Team: Beckie Salts, MD as PCP - General (Internal Medicine)  Extended Emergency Contact Information Primary Emergency Contact: [name],[name] Address: [place], [place] [address] [name] of [place] Phone: [phone] Mobile Phone: [phone] Relation: Son Secondary Emergency Contact: [name] [place] of [place] Mobile Phone: [phone] Relation: Other  Code Status:  FULL CODE  Goals of care: Advanced Directive information Advanced Directives 05/15/2019  Does Patient Have a Medical Advance Directive? Yes  Type of Advance Directive Out of facility DNR (pink MOST or yellow form)  Does patient want to make changes to medical advance directive? No - Patient declined  Would patient like information on creating a medical advance directive? -  Pre-existing out of facility DNR order (yellow form or pink MOST form) -     Chief Complaint  Patient presents with   Medical Management of Chronic Issues    Routine [facility] visit    HPI:  Pt is a 79 y.o. male seen today for medical management of chronic diseases.  He is a long-term care resident of [facility].  He has a PMH of CHF, BPH and hyperlipidemia. CBGs ranging from 103 to 148 with outlier 84. He takes Toujeo 35 units at bedtime. SBPs ranging from 102 to 144. He takes Losartan and Metoprolol succinate ER. So has decided for him to stay at [facility] as long-term care.   Past Medical History:  Diagnosis Date   Diabetes mellitus with neuropathy ([place])    Hyperlipidemia    Hypertension    Leukemia ([place])    on Sprycel   MRSA infection 09/19/2015   left arm   Urinary retention 01/11/2014   Past Surgical History:  Procedure Laterality Date   APPENDECTOMY     BACK SURGERY     CHOLECYSTECTOMY      EYE SURGERY     INCISE AND DRAIN ABCESS Left 09/16/2015   forearm; debscess undere MAC; [facility]; Dr Curley Spice   INCISION AND DRAINAGE ABSCESS Left 09/19/15   forearm wound washout with pulavac at [facility] by Dr Curley Spice    Allergies  Allergen Reactions   Influenza Vaccines Hives   Aspirin Other (See Comments)    DIZZINESS, UNCONSCIOUSNESS 09/16/15 pt takes 81 mg aspirin at home.[name], RPh   Cyclobenzaprine Hives   Dextromethorphan Other (See Comments)    Unknown    Doxycycline Nausea Only   Flu Virus Vaccine    Gabapentin Other (See Comments)    GI UPSET,DROWSINESS   Glipizide Other (See Comments)    DIZZINESS   Hydrocodone-Acetaminophen Other (See Comments)    Unknown    Iodinated Diagnostic Agents Other (See Comments)    Unknown  Unknown    Latex Other (See Comments)    Unknown    Lisinopril Other (See Comments)    COUGH   Pregabalin Other (See Comments)    GI UPSET,DROWSINESS   Ropinirole Nausea Only   Salicylates Other (See Comments)    Unknown    Sulfamethoxazole-Trimethoprim Nausea Only   Oxycodone-Acetaminophen Hives and Nausea Only   Penicillins Hives, Itching and Rash    Has patient had a PCN reaction causing immediate rash, facial/tongue/throat swelling, SOB or lightheadedness with hypotension: Yes Has patient had a PCN reaction causing severe rash involving mucus membranes or skin necrosis: No  Has patient had a PCN reaction that required hospitalization: No Has patient had a PCN reaction occurring within the last 10 years: No If all of the above answers are "NO", then may proceed with Cephalosporin use.     Outpatient Encounter Medications as of 01/21/2020  Medication Sig   acetaminophen (TYLENOL) 325 MG tablet Take 650 mg by mouth every 8 (eight) hours as needed for mild pain, moderate pain or headache.    acetaminophen (TYLENOL) 325 MG tablet Take 650 mg by mouth in the morning and at bedtime.    azelastine  (ASTELIN) 0.1 % nasal spray Place 1 spray into both nostrils 2 (two) times daily as needed for rhinitis or allergies.   bisacodyl (DULCOLAX) 10 MG suppository If not relieved by MOM, give 10 mg Bisacodyl suppositiory rectally X 1 dose in 24 hours as needed (Do not use constipation standing orders for residents with renal failure/CFR less than 30. Contact MD for orders) (Physician Order)   dasatinib (SPRYCEL) 20 MG tablet Take 2.5 tablets (50 mg total) by mouth daily. Take 2.5 tablets to = 50 mg   ergocalciferol (VITAMIN D2) 1.25 MG (50000 UT) capsule Take 1 capsule (50,000 Units total) by mouth once a week. FOR VITAMIN D DEFICIENCY (DO NOT CRUSH)   finasteride (PROSCAR) 5 MG tablet Take 1 tablet (5 mg total) by mouth daily.   furosemide (LASIX) 20 MG tablet Take 1 tablet (20 mg total) by mouth daily.   insulin glargine, 1 Unit Dial, (TOUJEO SOLOSTAR) 300 UNIT/ML Solostar Pen Inject 35 Units into the skin at bedtime.   loperamide (IMODIUM A-D) 2 MG tablet Take 4 mg by mouth as needed for diarrhea or loose stools. Take 4 mg after 1st loose stool   losartan (COZAAR) 25 MG tablet Take 1 tablet (25 mg total) by mouth daily.   magnesium hydroxide (MILK OF MAGNESIA) 400 MG/5ML suspension If no BM in 3 days, give 30 cc Milk of Magnesium p.o. x 1 dose in 24 hours as needed (Do not use standing constipation orders for residents with renal failure CFR less than 30. Contact MD for orders) (Physician Order)   melatonin 3 MG TABS tablet Take 1 tablet (3 mg total) by mouth at bedtime.   metoprolol succinate (TOPROL-XL) 50 MG 24 hr tablet Take 1 tablet (50 mg total) by mouth daily. Take with or immediately following a meal.   nitroGLYCERIN (NITROSTAT) 0.4 MG SL tablet Place 1 tablet (0.4 mg total) under the tongue every 5 (five) minutes as needed for chest pain.   Nutritional Supplement LIQD Take 120 mLs by mouth in the morning and at bedtime. MedPass   ondansetron (ZOFRAN) 4 MG tablet Take 4 mg by mouth  every 6 (six) hours as needed for nausea or vomiting.   Sodium Phosphates (RA SALINE ENEMA RE) If not relieved by Biscodyl suppository, give disposable Saline Enema rectally X 1 dose/24 hrs as needed (Do not use constipation standing orders for residents with renal failure/CFR less than 30. Contact MD for orders)(Physician Or   tamsulosin (FLOMAX) 0.4 MG CAPS capsule Take 1 capsule (0.4 mg total) by mouth daily.   [DISCONTINUED] atorvastatin (LIPITOR) 80 MG tablet Take 1 tablet (80 mg total) by mouth daily.   No facility-administered encounter medications on file as of 01/21/2020.    Review of Systems  GENERAL: No change in appetite, no fatigue, no weight changes, no fever, chills or weakness MOUTH and THROAT: Denies oral discomfort, gingival pain or bleeding RESPIRATORY: no cough, SOB, DOE,  wheezing, hemoptysis CARDIAC: No chest pain, edema or palpitations GI: No abdominal pain, diarrhea, constipation, heart burn, nausea or vomiting GU: Denies dysuria, frequency, hematuria, incontinence, or discharge NEUROLOGICAL: Denies dizziness, syncope, numbness, or headache PSYCHIATRIC: Denies feelings of depression or anxiety.   Immunization History  Administered Date(s) Administered   Influenza, Seasonal, Injecte, Preservative Fre 08/21/2012   Influenza-Unspecified 08/21/2012   Moderna SARS-COVID-2 Vaccination 12/24/2019   Pneumococcal Conjugate-13 02/28/2015, 11/25/2015   Pneumococcal Polysaccharide-23 08/26/2016   Pertinent  Health Maintenance Due  Topic Date Due   FOOT EXAM  Never done   OPHTHALMOLOGY EXAM  Never done   INFLUENZA VACCINE  03/16/2020   HEMOGLOBIN A1C  07/04/2020   PNA vac Low Risk Adult  Completed    Vitals:   01/21/20 1054  BP: 114/60  Pulse: 74  Resp: 20  Temp: 98 F (36.7 C)  TempSrc: Oral  Weight: 157 lb 12.8 oz (71.6 kg)  Height: _0  (1.854 m)   Body mass index is 20.82 kg/m.  Physical Exam  GENERAL APPEARANCE: Well nourished. In no  acute distress. Normal body habitus SKIN:  Skin is warm and dry.  MOUTH and THROAT: Lips are without lesions. Oral mucosa is moist and without lesions. Tongue is normal in shape, size, and color and without lesions RESPIRATORY: Breathing is even & unlabored, BS CTAB CARDIAC: RRR, no murmur,no extra heart sounds, no edema GI: Abdomen soft, normal BS, no masses, no tenderness NEUROLOGICAL: There is no tremor. Speech is clear PSYCHIATRIC:  Affect and behavior are appropriate  Labs reviewed: Recent Labs    04/16/19 2027 01/02/20 0000 01/03/20 0000  NA 137 136* 136*  K 3.3* 4.9 4.9  CL 104 103 105  CO2 _1 GLUCOSE 171*  --   --   BUN 17 86* 88*  CREATININE 0.96 1.8* 1.9*  CALCIUM 9.0 9.0 8.9   Recent Labs    04/16/19 2027 01/02/20 0000  AST 19 36  ALT 14 42*  ALKPHOS 71 93  BILITOT 0.5  --   PROT 7.6  --   ALBUMIN 3.7 3.8   Recent Labs    04/16/19 2027 01/02/20 0000  WBC 7.6 7.0  NEUTROABS 4.4 3  HGB 11.2* 9.6*  HCT 33.4* 29*  MCV 82.1  --   PLT 211 172   Lab Results  Component Value Date   TSH 5.20 01/02/2020   Lab Results  Component Value Date   HGBA1C 7.6 01/02/2020    Assessment/Plan  1. Chronic diastolic heart failure (HCC) - no SOB, continue metoprolol succinate ER 50 mg 1 tab daily, losartan 25 mg 1 tab daily and Lasix 20 mg 1 tab daily  2. Essential (primary) hypertension -Controlled, continue losartan 25 mg 1 tab daily and metoprolol succinate ER 50 mg 1 tab daily  3. 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 Cruzita Lederer Vallathucherry     Family/ staff Communication: Discussed plan of care with resident and charge nurse.  Labs/tests ordered: None  Goals of care:   Long-term  care   Durenda Age, DNP, FNP-BC [facility] and Adult Medicine [phone] (Monday-Friday 8:00 a.m. - 5:00 p.m.) [phone] (after hours)

## 2020-01-29 LAB — BASIC METABOLIC PANEL
BUN: 70 — AB (ref 4–21)
CO2: 15 (ref 13–22)
Chloride: 109 — AB (ref 99–108)
Creatinine: 1.5 — AB (ref 0.6–1.3)
Glucose: 95
Potassium: 4 (ref 3.4–5.3)
Sodium: 134 — AB (ref 137–147)

## 2020-01-29 LAB — COMPREHENSIVE METABOLIC PANEL
Calcium: 8.5 — AB (ref 8.7–10.7)
GFR calc Af Amer: 49.89
GFR calc non Af Amer: 43.05

## 2020-02-06 ENCOUNTER — Encounter: Payer: Self-pay | Admitting: Adult Health

## 2020-02-06 ENCOUNTER — Non-Acute Institutional Stay (SKILLED_NURSING_FACILITY): Payer: Medicare Other | Admitting: Adult Health

## 2020-02-06 DIAGNOSIS — E162 Hypoglycemia, unspecified: Secondary | ICD-10-CM | POA: Diagnosis not present

## 2020-02-06 DIAGNOSIS — J189 Pneumonia, unspecified organism: Secondary | ICD-10-CM | POA: Diagnosis not present

## 2020-02-06 LAB — CBC: RBC: 3.29 — AB (ref 3.87–5.11)

## 2020-02-06 LAB — COMPREHENSIVE METABOLIC PANEL
Calcium: 8.6 — AB (ref 8.7–10.7)
GFR calc Af Amer: 51.08
GFR calc non Af Amer: 44.08

## 2020-02-06 LAB — CBC AND DIFFERENTIAL
HCT: 27 — AB (ref 41–53)
Hemoglobin: 9.1 — AB (ref 13.5–17.5)
Neutrophils Absolute: 17
Platelets: 258 (ref 150–399)
WBC: 21.2

## 2020-02-06 LAB — BASIC METABOLIC PANEL
BUN: 70 — AB (ref 4–21)
CO2: 14 (ref 13–22)
Chloride: 105 (ref 99–108)
Creatinine: 1.5 — AB (ref 0.6–1.3)
Glucose: 127
Potassium: 4.2 (ref 3.4–5.3)
Sodium: 132 — AB (ref 137–147)

## 2020-02-06 MED ORDER — AZITHROMYCIN 250 MG PO TABS: ORAL_TABLET | ORAL | 0 refills | Status: DC

## 2020-02-06 NOTE — Progress Notes (Signed)
Location:  [place] Room Number: 113-A Place of Service:  SNF (31) Provider:  Durenda Age, DNP, FNP-BC  Patient Care Team: Beckie Salts, MD as PCP - General (Internal Medicine)  Extended Emergency Contact Information Primary Emergency Contact: Arlen,[name] Address: [place],  [address] [name] of [place] Phone: [phone] Mobile Phone: [phone] Relation: Son Secondary Emergency Contact: [name] [place] of [place] Mobile Phone: [phone] Relation: Other  Code Status:  FULL CODE  Goals of care: Advanced Directive information Advanced Directives 05/15/2019  Does Patient Have a Medical Advance Directive? Yes  Type of Advance Directive Out of facility DNR (pink MOST or yellow form)  Does patient want to make changes to medical advance directive? No - Patient declined  Would patient like information on creating a medical advance directive? -  Pre-existing out of facility DNR order (yellow form or pink MOST form) -     Chief Complaint  Patient presents with  . Acute Visit    Patient is seen for low blood sugars    HPI:  Pt is a 79 y.o. male who is a long-term care resident of [facility].  He has a PMH of CHF, diabetes mellitus, BPH and hyperlipidemia.  He was seen in his room today.  He complained of being "cold and cannot seem to get warm". He has occasional coughing. Last night, he had fevfever with Temperature of 100. Toujeo was held last night due to hypoglycemia. CBG this morning, before breakfast, was 88 and when checked again, it was 50. Glucagon 1 mg SQ was given and CBG got up to 105. CBG checked again before supper was 45. He was given Glucagon 1 mg SQ was again given. Chest x-ray was done. Chest x-ray showed patchy opacity in both lungs.   Past Medical History:  Diagnosis Date  . Diabetes mellitus with neuropathy ([place])   . Hyperlipidemia   .  Hypertension   . Leukemia ([place])    on Sprycel  . MRSA infection 09/19/2015   left arm  . Urinary retention 01/11/2014   Past Surgical History:  Procedure Laterality Date  . APPENDECTOMY    . BACK SURGERY    . CHOLECYSTECTOMY    . EYE SURGERY    . INCISE AND DRAIN ABCESS Left 09/16/2015   forearm; debscess undere MAC; [facility]; Dr Curley Spice  . INCISION AND DRAINAGE ABSCESS Left 09/19/15   forearm wound washout with pulavac at [facility] by Dr Curley Spice    Allergies  Allergen Reactions  . Influenza Vaccines Hives  . Aspirin Other (See Comments)    DIZZINESS, UNCONSCIOUSNESS 09/16/15 pt takes 81 mg aspirin at home.[name], RPh  . Cyclobenzaprine Hives  . Dextromethorphan Other (See Comments)    Unknown   . Doxycycline Nausea Only  . Flu Virus Vaccine   . Gabapentin Other (See Comments)    GI UPSET,DROWSINESS  . Glipizide Other (See Comments)    DIZZINESS  . Hydrocodone-Acetaminophen Other (See Comments)    Unknown   . Iodinated Diagnostic Agents Other (See Comments)    Unknown  Unknown   . Latex Other (See Comments)    Unknown   . Lisinopril Other (See Comments)    COUGH  . Pregabalin Other (See Comments)    GI UPSET,DROWSINESS  . Ropinirole Nausea Only  . Salicylates Other (See Comments)    Unknown   . Sulfamethoxazole-Trimethoprim Nausea Only  .  Oxycodone-Acetaminophen Hives and Nausea Only  . Penicillins Hives, Itching and Rash    Has patient had a PCN reaction causing immediate rash, facial/tongue/throat swelling, SOB or lightheadedness with hypotension: Yes Has patient had a PCN reaction causing severe rash involving mucus membranes or skin necrosis: No Has patient had a PCN reaction that required hospitalization: No Has patient had a PCN reaction occurring within the last 10 years: No If all of the above answers are "NO", then may proceed with Cephalosporin use.     Outpatient Encounter Medications as of 02/06/2020  Medication Sig  .  acetaminophen (TYLENOL) 325 MG tablet Take 650 mg by mouth every 8 (eight) hours as needed for mild pain, moderate pain or headache.   Marland Kitchen acetaminophen (TYLENOL) 325 MG tablet Take 650 mg by mouth in the morning and at bedtime.   Marland Kitchen azelastine (ASTELIN) 0.1 % nasal spray Place 1 spray into both nostrils 2 (two) times daily as needed for rhinitis or allergies.  . bisacodyl (DULCOLAX) 10 MG suppository If not relieved by MOM, give 10 mg Bisacodyl suppositiory rectally X 1 dose in 24 hours as needed (Do not use constipation standing orders for residents with renal failure/CFR less than 30. Contact MD for orders) (Physician Order)  . dasatinib (SPRYCEL) 20 MG tablet Take 2.5 tablets (50 mg total) by mouth daily. Take 2.5 tablets to = 50 mg  . ergocalciferol (VITAMIN D2) 1.25 MG (50000 UT) capsule Take 1 capsule (50,000 Units total) by mouth once a week. FOR VITAMIN D DEFICIENCY (DO NOT CRUSH)  . finasteride (PROSCAR) 5 MG tablet Take 1 tablet (5 mg total) by mouth daily.  . furosemide (LASIX) 20 MG tablet Take 1 tablet (20 mg total) by mouth daily.  . insulin glargine, 1 Unit Dial, (TOUJEO SOLOSTAR) 300 UNIT/ML Solostar Pen Inject 35 Units into the skin at bedtime.  Marland Kitchen loperamide (IMODIUM A-D) 2 MG tablet Take 4 mg by mouth as needed for diarrhea or loose stools. Take 4 mg after 1st loose stool  . losartan (COZAAR) 25 MG tablet Take 1 tablet (25 mg total) by mouth daily.  . magnesium hydroxide (MILK OF MAGNESIA) 400 MG/5ML suspension If no BM in 3 days, give 30 cc Milk of Magnesium p.o. x 1 dose in 24 hours as needed (Do not use standing constipation orders for residents with renal failure CFR less than 30. Contact MD for orders) (Physician Order)  . melatonin 3 MG TABS tablet Take 1 tablet (3 mg total) by mouth at bedtime.  . metoprolol succinate (TOPROL-XL) 50 MG 24 hr tablet Take 1 tablet (50 mg total) by mouth daily. Take with or immediately following a meal.  . nitroGLYCERIN (NITROSTAT) 0.4 MG SL tablet  Place 1 tablet (0.4 mg total) under the tongue every 5 (five) minutes as needed for chest pain.  . Nutritional Supplement LIQD Take 120 mLs by mouth in the morning and at bedtime. MedPass  . ondansetron (ZOFRAN) 4 MG tablet Take 4 mg by mouth every 6 (six) hours as needed for nausea or vomiting.  . Pramox-PE-Glycerin-Petrolatum (HEMORRHOIDAL EX) Apply 1 application topically 2 (two) times daily as needed.  . Sodium Phosphates (RA SALINE ENEMA RE) If not relieved by Biscodyl suppository, give disposable Saline Enema rectally X 1 dose/24 hrs as needed (Do not use constipation standing orders for residents with renal failure/CFR less than 30. Contact MD for orders)(Physician Or  . tamsulosin (FLOMAX) 0.4 MG CAPS capsule Take 1 capsule (0.4 mg total) by mouth daily.  No facility-administered encounter medications on file as of 02/06/2020.    Review of Systems  GENERAL: No change in appetite, +fever MOUTH and THROAT: Denies oral discomfort, gingival pain or bleeding RESPIRATORY: no cough, SOB, DOE, wheezing, hemoptysis CARDIAC: No chest pain, edema or palpitations GI: No abdominal pain, diarrhea, constipation, heart burn, nausea or vomiting GU: Denies dysuria, frequency, hematuria, incontinence, or discharge NEUROLOGICAL: Denies dizziness, syncope, numbness, or headache PSYCHIATRIC: Denies feelings of depression or anxiety. No report of hallucinations, insomnia, paranoia, or agitation   Immunization History  Administered Date(s) Administered  . Influenza, Seasonal, Injecte, Preservative Fre 08/21/2012  . Influenza-Unspecified 08/21/2012  . Moderna SARS-COVID-2 Vaccination 12/24/2019  . Pneumococcal Conjugate-13 02/28/2015, 11/25/2015  . Pneumococcal Polysaccharide-23 08/26/2016   Pertinent  Health Maintenance Due  Topic Date Due  . FOOT EXAM  Never done  . OPHTHALMOLOGY EXAM  Never done  . INFLUENZA VACCINE  03/16/2020  . HEMOGLOBIN A1C  07/04/2020  . PNA vac Low Risk Adult  Completed      Vitals:   02/06/20 1148  BP: 110/62  Pulse: 60  Resp: 17  Temp: 97.7 F (36.5 C)  TempSrc: Oral  Weight: 152 lb (68.9 kg)  Height: _0  (1.854 m)   Body mass index is 20.05 kg/m.  Physical Exam  GENERAL APPEARANCE: Well nourished. In no acute distress. Normal body habitus SKIN:  Skin is warm and dry.  MOUTH and THROAT: Lips are without lesions. Oral mucosa is moist and without lesions. Tongue is normal in shape, size, and color and without lesions RESPIRATORY: Breathing is even & unlabored, BS CTAB CARDIAC: RRR, no murmur,no extra heart sounds, no edema GI: Abdomen soft, normal BS, no masses, no tenderness EXTREMITIES:  Able to move X 4 extremities NEUROLOGICAL: There is no tremor. Speech is clear. PSYCHIATRIC:  Affect and behavior are appropriate   Labs reviewed: Recent Labs    04/16/19 2027 04/16/19 2027 01/02/20 0000 01/03/20 0000 01/10/20 0000  NA 137  --  136* 136* 136*  K 3.3*   < > 4.9 4.9 4.1  CL 104   < > 103 105 108  CO2 23   < > _1 GLUCOSE 171*  --   --   --   --   BUN 17  --  86* 88* 65*  CREATININE 0.96  --  1.8* 1.9* 1.5*  CALCIUM 9.0   < > 9.0 8.9 9.0   < > = values in this interval not displayed.   Recent Labs    04/16/19 2027 01/02/20 0000 01/10/20 0000  AST 19 36 34  ALT 14 42* 45*  ALKPHOS 71 93 96  BILITOT 0.5  --   --   PROT 7.6  --   --   ALBUMIN 3.7 3.8 4.0   Recent Labs    04/16/19 2027 01/02/20 0000  WBC 7.6 7.0  NEUTROABS 4.4 3  HGB 11.2* 9.6*  HCT 33.4* 29*  MCV 82.1  --   PLT 211 172   Lab Results  Component Value Date   TSH 5.20 01/02/2020   Lab Results  Component Value Date   HGBA1C 7.6 01/02/2020    Assessment/Plan  1. Hypoglycemia - will discontinue Toujeo - start Novolog 5 units SQ ACHS for CBG >= 200 - CBG ACHS  2. Pneumonia of both lungs due to infectious organism, unspecified part of lung - Start Azithromycin 250 mg 2 tabs on 1st day and 250 mg daily on 2nd to 5th  day  Family/ staff Communication:   Discussed plan of care with resident and charge nurse.  Labs/tests ordered:  CBC and BMP   Goals of care:   Long-term care   Durenda Age, DNP, FNP-BC [facility] and Adult Medicine [phone] (Monday-Friday 8:00 a.m. - 5:00 p.m.) [phone] (after hours)

## 2020-02-09 ENCOUNTER — Other Ambulatory Visit: Payer: Self-pay | Admitting: Adult Health

## 2020-02-09 ENCOUNTER — Encounter: Payer: Self-pay | Admitting: Adult Health

## 2020-02-09 MED ORDER — CIPROFLOXACIN HCL 500 MG PO TABS
500.0000 mg | ORAL_TABLET | Freq: Two times a day (BID) | ORAL | 0 refills | Status: AC
Start: 2020-02-09 — End: 2020-02-16

## 2020-02-09 NOTE — Progress Notes (Signed)
This encounter was created in error - please disregard.

## 2020-02-11 ENCOUNTER — Non-Acute Institutional Stay (SKILLED_NURSING_FACILITY): Payer: Medicare Other | Admitting: Adult Health

## 2020-02-11 ENCOUNTER — Encounter: Payer: Self-pay | Admitting: Adult Health

## 2020-02-11 DIAGNOSIS — N400 Enlarged prostate without lower urinary tract symptoms: Secondary | ICD-10-CM

## 2020-02-11 DIAGNOSIS — N3000 Acute cystitis without hematuria: Secondary | ICD-10-CM | POA: Diagnosis not present

## 2020-02-11 DIAGNOSIS — I10 Essential (primary) hypertension: Secondary | ICD-10-CM

## 2020-02-11 DIAGNOSIS — I5032 Chronic diastolic (congestive) heart failure: Secondary | ICD-10-CM

## 2020-02-11 DIAGNOSIS — J189 Pneumonia, unspecified organism: Secondary | ICD-10-CM | POA: Diagnosis not present

## 2020-02-11 DIAGNOSIS — Z7189 Other specified counseling: Secondary | ICD-10-CM | POA: Diagnosis not present

## 2020-02-11 DIAGNOSIS — C9010 Plasma cell leukemia not having achieved remission: Secondary | ICD-10-CM

## 2020-02-11 NOTE — Progress Notes (Signed)
Location:  [place] Room Number: 113-A Place of Service:  SNF (31) Provider:  Durenda Age, DNP, FNP-BC  Patient Care Team: Beckie Salts, MD as PCP - General (Internal Medicine)  Extended Emergency Contact Information Primary Emergency Contact: Artiga,[name] Address: [place], [place] [address] [name] of [place] Phone: [phone] Mobile Phone: [phone] Relation: Son Secondary Emergency Contact: [name] [place] of [place] Mobile Phone: [phone] Relation: Other  Code Status:  FULL CODE  Goals of care: Advanced Directive information Advanced Directives 05/15/2019  Does Patient Have a Medical Advance Directive? Yes  Type of Advance Directive Out of facility DNR (pink MOST or yellow form)  Does patient want to make changes to medical advance directive? No - Patient declined  Would patient like information on creating a medical advance directive? -  Pre-existing out of facility DNR order (yellow form or pink MOST form) -     Chief Complaint  Patient presents with  . Advanced Directive    Patient is seen for a Care Plan Meeting    HPI:  Pt is a 79 y.o. male who had a care plan meeting attended by resident, NP, MDS coordinator, activity director and [name], son, who attended via teleconference.  Resident remains to be full code.  Discussed medications, vital signs and weights.  Resident is currently taking Cipro for UTI.  Recently finished antibiotics for pneumonia, Azithromycin. Toujeo was recently discontinued for episodes of hypoglycemia. He has no dentures requested for the in house dentist to see him.  Answered all medical questions. He is currently a long-term care resident of [facility].  He has a PMH of CHF, BPH and hyperlipidemia.  The meeting lasted for 30 minutes.   Past Medical History:  Diagnosis Date  . Diabetes mellitus with neuropathy ([place])   .  Hyperlipidemia   . Hypertension   . Leukemia ([place])    on Sprycel  . MRSA infection 09/19/2015   left arm  . Urinary retention 01/11/2014   Past Surgical History:  Procedure Laterality Date  . APPENDECTOMY    . BACK SURGERY    . CHOLECYSTECTOMY    . EYE SURGERY    . INCISE AND DRAIN ABCESS Left 09/16/2015   forearm; debscess undere MAC; [facility]; Dr Curley Spice  . INCISION AND DRAINAGE ABSCESS Left 09/19/15   forearm wound washout with pulavac at [facility] by Dr Curley Spice    Allergies  Allergen Reactions  . Influenza Vaccines Hives  . Aspirin Other (See Comments)    DIZZINESS, UNCONSCIOUSNESS 09/16/15 pt takes 81 mg aspirin at home.[name], RPh  . Cyclobenzaprine Hives  . Dextromethorphan Other (See Comments)    Unknown   . Doxycycline Nausea Only  . Flu Virus Vaccine   . Gabapentin Other (See Comments)    GI UPSET,DROWSINESS  . Glipizide Other (See Comments)    DIZZINESS  . Hydrocodone-Acetaminophen Other (See Comments)    Unknown   . Iodinated Diagnostic Agents Other (See Comments)    Unknown  Unknown   . Latex Other (See Comments)    Unknown   . Lisinopril Other (See Comments)    COUGH  . Pregabalin Other (See Comments)    GI UPSET,DROWSINESS  . Ropinirole Nausea Only  . Salicylates Other (See Comments)    Unknown   . Sulfamethoxazole-Trimethoprim Nausea Only  . Oxycodone-Acetaminophen Hives and Nausea Only  . Penicillins Hives, Itching and Rash  Has patient had a PCN reaction causing immediate rash, facial/tongue/throat swelling, SOB or lightheadedness with hypotension: Yes Has patient had a PCN reaction causing severe rash involving mucus membranes or skin necrosis: No Has patient had a PCN reaction that required hospitalization: No Has patient had a PCN reaction occurring within the last 10 years: No If all of the above answers are "NO", then may proceed with Cephalosporin use.     Outpatient Encounter Medications as of 02/11/2020  Medication  Sig  . acetaminophen (TYLENOL) 325 MG tablet Take 650 mg by mouth every 8 (eight) hours as needed for mild pain, moderate pain or headache.   Marland Kitchen acetaminophen (TYLENOL) 325 MG tablet Take 650 mg by mouth in the morning and at bedtime.   Marland Kitchen azelastine (ASTELIN) 0.1 % nasal spray Place 1 spray into both nostrils 2 (two) times daily as needed for rhinitis or allergies.  . bisacodyl (DULCOLAX) 10 MG suppository If not relieved by MOM, give 10 mg Bisacodyl suppositiory rectally X 1 dose in 24 hours as needed (Do not use constipation standing orders for residents with renal failure/CFR less than 30. Contact MD for orders) (Physician Order)  . ciprofloxacin (CIPRO) 500 MG tablet Take 1 tablet (500 mg total) by mouth 2 (two) times daily for 7 days.  . dasatinib (SPRYCEL) 20 MG tablet Take 2.5 tablets (50 mg total) by mouth daily. Take 2.5 tablets to = 50 mg  . ergocalciferol (VITAMIN D2) 1.25 MG (50000 UT) capsule Take 1 capsule (50,000 Units total) by mouth once a week. FOR VITAMIN D DEFICIENCY (DO NOT CRUSH)  . finasteride (PROSCAR) 5 MG tablet Take 1 tablet (5 mg total) by mouth daily.  . furosemide (LASIX) 20 MG tablet Take 1 tablet (20 mg total) by mouth daily.  . insulin aspart (NOVOLOG FLEXPEN) 100 UNIT/ML FlexPen Inject 5 Units into the skin 4 (four) times daily -  before meals and at bedtime.  Marland Kitchen loperamide (IMODIUM A-D) 2 MG tablet Take 4 mg by mouth as needed for diarrhea or loose stools. Take 4 mg after 1st loose stool  . losartan (COZAAR) 25 MG tablet Take 1 tablet (25 mg total) by mouth daily.  . magnesium hydroxide (MILK OF MAGNESIA) 400 MG/5ML suspension If no BM in 3 days, give 30 cc Milk of Magnesium p.o. x 1 dose in 24 hours as needed (Do not use standing constipation orders for residents with renal failure CFR less than 30. Contact MD for orders) (Physician Order)  . melatonin 3 MG TABS tablet Take 1 tablet (3 mg total) by mouth at bedtime.  . metoprolol succinate (TOPROL-XL) 50 MG 24 hr  tablet Take 1 tablet (50 mg total) by mouth daily. Take with or immediately following a meal.  . nitroGLYCERIN (NITROSTAT) 0.4 MG SL tablet Place 1 tablet (0.4 mg total) under the tongue every 5 (five) minutes as needed for chest pain.  . Nutritional Supplement LIQD Take 120 mLs by mouth in the morning and at bedtime. MedPass  . ondansetron (ZOFRAN) 4 MG tablet Take 4 mg by mouth every 6 (six) hours as needed for nausea or vomiting.  . Pramox-PE-Glycerin-Petrolatum (HEMORRHOIDAL EX) Apply 1 application topically 2 (two) times daily as needed.  . Sodium Phosphates (RA SALINE ENEMA RE) If not relieved by Biscodyl suppository, give disposable Saline Enema rectally X 1 dose/24 hrs as needed (Do not use constipation standing orders for residents with renal failure/CFR less than 30. Contact MD for orders)(Physician Or  . tamsulosin (FLOMAX) 0.4 MG CAPS  capsule Take 1 capsule (0.4 mg total) by mouth daily.  . [DISCONTINUED] azithromycin (ZITHROMAX Z-PAK) 250 MG tablet Take 2 tabs orally on first day then 1 tab orally daily X 4 days  . [DISCONTINUED] insulin glargine, 1 Unit Dial, (TOUJEO SOLOSTAR) 300 UNIT/ML Solostar Pen Inject 35 Units into the skin at bedtime.   No facility-administered encounter medications on file as of 02/11/2020.    Review of Systems  GENERAL: No change in appetite, no fatigue, no weight changes, no fever, chills or weakness MOUTH and THROAT: Denies oral discomfort, gingival pain or bleeding RESPIRATORY: no cough, SOB, DOE, wheezing, hemoptysis CARDIAC: No chest pain, edema or palpitations GI: No abdominal pain, diarrhea, constipation, heart burn, nausea or vomiting GU: Denies dysuria, frequency, hematuria, incontinence, or discharge NEUROLOGICAL: Denies dizziness, syncope, numbness, or headache PSYCHIATRIC: Denies feelings of depression or anxiety. No report of hallucinations, insomnia, paranoia, or agitation   Immunization History  Administered Date(s) Administered  .  Influenza, Seasonal, Injecte, Preservative Fre 08/21/2012  . Influenza-Unspecified 08/21/2012  . Moderna SARS-COVID-2 Vaccination 12/24/2019  . Pneumococcal Conjugate-13 02/28/2015, 11/25/2015  . Pneumococcal Polysaccharide-23 08/26/2016   Pertinent  Health Maintenance Due  Topic Date Due  . FOOT EXAM  Never done  . OPHTHALMOLOGY EXAM  Never done  . INFLUENZA VACCINE  03/16/2020  . HEMOGLOBIN A1C  07/04/2020  . PNA vac Low Risk Adult  Completed    Vitals:   02/11/20 1513  BP: 122/79  Pulse: 81  Resp: 20  Temp: 97.7 F (36.5 C)  TempSrc: Oral  Weight: 154 lb 12.8 oz (70.2 kg)  Height: _0  (1.854 m)   Body mass index is 20.42 kg/m.  Physical Exam  GENERAL APPEARANCE: Well nourished. In no acute distress. Normal body habitus SKIN:  Skin is warm and dry.  MOUTH and THROAT: Lips are without lesions. Oral mucosa is moist and without lesions. Tongue is normal in shape, size, and color and without lesions RESPIRATORY: Breathing is even & unlabored, BS CTAB CARDIAC: RRR, no murmur,no extra heart sounds, no edema GI: Abdomen soft, normal BS, no masses, no tenderness NEUROLOGICAL: There is no tremor. Speech is clear. Alert and oriented X 3. PSYCHIATRIC:  Affect and behavior are appropriate  Labs reviewed: Recent Labs    04/16/19 2027 01/02/20 0000 01/03/20 0000 01/10/20 0000 01/29/20 0000  NA 137   < > 136* 136* 134*  K 3.3*   < > 4.9 4.1 4.0  CL 104   < > 105 108 109*  CO2 23   < > _1 GLUCOSE 171*  --   --   --   --   BUN 17   < > 88* 65* 70*  CREATININE 0.96   < > 1.9* 1.5* 1.5*  CALCIUM 9.0   < > 8.9 9.0 8.5*   < > = values in this interval not displayed.   Recent Labs    04/16/19 2027 01/02/20 0000 01/10/20 0000  AST 19 36 34  ALT 14 42* 45*  ALKPHOS 71 93 96  BILITOT 0.5  --   --   PROT 7.6  --   --   ALBUMIN 3.7 3.8 4.0   Recent Labs    04/16/19 2027 01/02/20 0000  WBC 7.6 7.0  NEUTROABS 4.4 3  HGB 11.2* 9.6*  HCT 33.4* 29*  MCV 82.1   --   PLT 211 172   Lab Results  Component Value Date   TSH 5.20 01/02/2020   Lab Results  Component Value Date   HGBA1C 7.6 01/02/2020    Assessment/Plan  1. Advance care planning -  Remains to be full code -Discussed medications, vital signs and weights  2. Acute cystitis without hematuria -Continue Cipro for a total of 7 days  3. Pneumonia of both lungs due to infectious organism, unspecified part of lung -Recently finished azithromycin, resolved  4. Essential (primary) hypertension -Stable, continue metoprolol succinate ER and losartan  5. Chronic diastolic heart failure (HCC) -No SOB, continue Lasix  6. 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 Benign fibroma of prostate -Continue finasteride and tamsulosin      Family/ staff Communication:   Discussed plan of care with resident, son and IDT.  Labs/tests ordered: None  Goals of care:   Long-term care   Durenda Age, DNP, FNP-BC [facility] [phone] (Monday-Friday 8:00 a.m. - 5:00 p.m.) [phone] (after hours)

## 2020-02-14 ENCOUNTER — Non-Acute Institutional Stay (SKILLED_NURSING_FACILITY): Payer: Medicare Other | Admitting: Adult Health

## 2020-02-14 ENCOUNTER — Encounter: Payer: Self-pay | Admitting: Adult Health

## 2020-02-14 DIAGNOSIS — N390 Urinary tract infection, site not specified: Secondary | ICD-10-CM | POA: Diagnosis not present

## 2020-02-14 DIAGNOSIS — C9010 Plasma cell leukemia not having achieved remission: Secondary | ICD-10-CM

## 2020-02-14 DIAGNOSIS — I10 Essential (primary) hypertension: Secondary | ICD-10-CM

## 2020-02-14 DIAGNOSIS — E1122 Type 2 diabetes mellitus with diabetic chronic kidney disease: Secondary | ICD-10-CM | POA: Diagnosis not present

## 2020-02-14 DIAGNOSIS — N400 Enlarged prostate without lower urinary tract symptoms: Secondary | ICD-10-CM

## 2020-02-14 DIAGNOSIS — I5032 Chronic diastolic (congestive) heart failure: Secondary | ICD-10-CM

## 2020-02-14 DIAGNOSIS — N183 Chronic kidney disease, stage 3 unspecified: Secondary | ICD-10-CM

## 2020-02-14 NOTE — Progress Notes (Signed)
Location:  [place] Room Number: 113-A Place of Service:  SNF (31) Provider:  Durenda Age, DNP, FNP-BC  Patient Care Team: Beckie Salts, MD as PCP - General (Internal Medicine)  Extended Emergency Contact Information Primary Emergency Contact: Mignone,[name] Address: [place], [place] [address] [name] of [place] Phone: [phone] Mobile Phone: [phone] Relation: Son Secondary Emergency Contact: [name] [place] of [place] Mobile Phone: [phone] Relation: Other  Code Status:  FULL CODE  Goals of care: Advanced Directive information Advanced Directives 05/15/2019  Does Patient Have a Medical Advance Directive? Yes  Type of Advance Directive Out of facility DNR (pink MOST or yellow form)  Does patient want to make changes to medical advance directive? No - Patient declined  Would patient like information on creating a medical advance directive? -  Pre-existing out of facility DNR order (yellow form or pink MOST form) -     Chief Complaint  Patient presents with  . Medical Management of Chronic Issues    Routine [facility] visit    HPI:  Pt is a 79 y.o. male seen today for medical management of chronic diseases.  He is a long-term care resident of [facility].  He has a PMH of CHF, diabetes mellitus, BPH and hyperlipidemia.  He was seen in his room today.  Currently, he is taking Cipro for UTI.  Urine culture showed  > 100,000 CFU/mL nonlactose fermenting gram-negative rods, Citrobacter koseri.  He denies dysuria nor hematuria. Toujeo was recently discontinued and currently taking NovoLog 5 units for CBG > 200. CBGs ranging from 107 to 254.   Past Medical History:  Diagnosis Date  . Diabetes mellitus with neuropathy ([place])   . Hyperlipidemia   . Hypertension   . Leukemia ([place])    on Sprycel  . MRSA infection 09/19/2015   left arm  . Urinary retention 01/11/2014    Past Surgical History:  Procedure Laterality Date  . APPENDECTOMY    . BACK SURGERY    . CHOLECYSTECTOMY    . EYE SURGERY    . INCISE AND DRAIN ABCESS Left 09/16/2015   forearm; debscess undere MAC; [facility]; Dr Curley Spice  . INCISION AND DRAINAGE ABSCESS Left 09/19/15   forearm wound washout with [facility]avac at [facility] by Dr Curley Spice    Allergies  Allergen Reactions  . Influenza Vaccines Hives  . Aspirin Other (See Comments)    DIZZINESS, UNCONSCIOUSNESS 09/16/15 pt takes 81 mg aspirin at home.[name], RPh  . Cyclobenzaprine Hives  . Dextromethorphan Other (See Comments)    Unknown   . Doxycycline Nausea Only  . Flu Virus Vaccine   . Gabapentin Other (See Comments)    GI UPSET,DROWSINESS  . Glipizide Other (See Comments)    DIZZINESS  . Hydrocodone-Acetaminophen Other (See Comments)    Unknown   . Iodinated Diagnostic Agents Other (See Comments)    Unknown  Unknown   . Latex Other (See Comments)    Unknown   . Lisinopril Other (See Comments)    COUGH  . Pregabalin Other (See Comments)    GI UPSET,DROWSINESS  . Ropinirole Nausea Only  . Salicylates Other (See Comments)    Unknown   . Sulfamethoxazole-Trimethoprim Nausea Only  . Oxycodone-Acetaminophen Hives and Nausea Only  . Penicillins Hives, Itching and Rash    Has patient had a PCN reaction causing immediate rash, facial/tongue/throat swelling, SOB or lightheadedness with hypotension:  Yes Has patient had a PCN reaction causing severe rash involving mucus membranes or skin necrosis: No Has patient had a PCN reaction that required hospitalization: No Has patient had a PCN reaction occurring within the last 10 years: No If all of the above answers are "NO", then may proceed with Cephalosporin use.     Outpatient Encounter Medications as of 02/14/2020  Medication Sig  . acetaminophen (TYLENOL) 325 MG tablet Take 650 mg by mouth every 8 (eight) hours as needed for mild pain, moderate pain or headache.    Marland Kitchen acetaminophen (TYLENOL) 325 MG tablet Take 650 mg by mouth in the morning and at bedtime.   Marland Kitchen azelastine (ASTELIN) 0.1 % nasal spray Place 1 spray into both nostrils 2 (two) times daily as needed for rhinitis or allergies.  . bisacodyl (DULCOLAX) 10 MG suppository If not relieved by MOM, give 10 mg Bisacodyl suppositiory rectally X 1 dose in 24 hours as needed (Do not use constipation standing orders for residents with renal failure/CFR less than 30. Contact MD for orders) (Physician Order)  . [EXPIRED] ciprofloxacin (CIPRO) 500 MG tablet Take 1 tablet (500 mg total) by mouth 2 (two) times daily for 7 days.  . dasatinib (SPRYCEL) 20 MG tablet Take 2.5 tablets (50 mg total) by mouth daily. Take 2.5 tablets to = 50 mg  . ergocalciferol (VITAMIN D2) 1.25 MG (50000 UT) capsule Take 1 capsule (50,000 Units total) by mouth once a week. FOR VITAMIN D DEFICIENCY (DO NOT CRUSH)  . finasteride (PROSCAR) 5 MG tablet Take 1 tablet (5 mg total) by mouth daily.  . furosemide (LASIX) 20 MG tablet Take 1 tablet (20 mg total) by mouth daily.  . insulin aspart (NOVOLOG FLEXPEN) 100 UNIT/ML FlexPen Inject 5 Units into the skin 4 (four) times daily -  before meals and at bedtime.  Marland Kitchen loperamide (IMODIUM A-D) 2 MG tablet Take 4 mg by mouth as needed for diarrhea or loose stools. Take 4 mg after 1st loose stool  . losartan (COZAAR) 25 MG tablet Take 1 tablet (25 mg total) by mouth daily.  . magnesium hydroxide (MILK OF MAGNESIA) 400 MG/5ML suspension If no BM in 3 days, give 30 cc Milk of Magnesium p.o. x 1 dose in 24 hours as needed (Do not use standing constipation orders for residents with renal failure CFR less than 30. Contact MD for orders) (Physician Order)  . melatonin 3 MG TABS tablet Take 1 tablet (3 mg total) by mouth at bedtime.  . metoprolol succinate (TOPROL-XL) 50 MG 24 hr tablet Take 1 tablet (50 mg total) by mouth daily. Take with or immediately following a meal.  . nitroGLYCERIN (NITROSTAT) 0.4 MG SL  tablet Place 1 tablet (0.4 mg total) under the tongue every 5 (five) minutes as needed for chest pain.  . Nutritional Supplement LIQD Take 120 mLs by mouth in the morning and at bedtime. MedPass  . ondansetron (ZOFRAN) 4 MG tablet Take 4 mg by mouth every 6 (six) hours as needed for nausea or vomiting.  . Sodium Phosphates (RA SALINE ENEMA RE) If not relieved by Biscodyl suppository, give disposable Saline Enema rectally X 1 dose/24 hrs as needed (Do not use constipation standing orders for residents with renal failure/CFR less than 30. Contact MD for orders)(Physician Or  . tamsulosin (FLOMAX) 0.4 MG CAPS capsule Take 1 capsule (0.4 mg total) by mouth daily.  . [DISCONTINUED] Pramox-PE-Glycerin-Petrolatum (HEMORRHOIDAL EX) Apply 1 application topically 2 (two) times daily as needed.   No facility-administered  encounter medications on file as of 02/14/2020.    Review of Systems  GENERAL: No change in appetite, no fatigue, no weight changes, no fever, chills or weakness MOUTH and THROAT: Denies oral discomfort, gingival pain or bleeding RESPIRATORY: no cough, SOB, DOE, wheezing, hemoptysis CARDIAC: No chest pain or palpitations GI: No abdominal pain, diarrhea, constipation, heart burn, nausea or vomiting GU: Denies dysuria, frequency, hematuria, incontinence, or discharge reported  NEUROLOGICAL: Denies dizziness, syncope, numbness, or headache PSYCHIATRIC: Denies feelings of depression or anxiety. No report of hallucinations, insomnia, paranoia, or agitation   Immunization History  Administered Date(s) Administered  . Influenza, Seasonal, Injecte, Preservative Fre 08/21/2012  . Influenza-Unspecified 08/21/2012  . Moderna SARS-COVID-2 Vaccination 12/24/2019  . Pneumococcal Conjugate-13 02/28/2015, 11/25/2015  . Pneumococcal Polysaccharide-23 08/26/2016   Pertinent  Health Maintenance Due  Topic Date Due  . FOOT EXAM  Never done  . OPHTHALMOLOGY EXAM  Never done  . INFLUENZA VACCINE   03/16/2020  . HEMOGLOBIN A1C  07/04/2020  . PNA vac Low Risk Adult  Completed    Vitals:   02/14/20 1104  BP: 135/72  Pulse: 92  Resp: 18  Temp: (!) 95.7 F (35.4 C)  TempSrc: Oral  Weight: 154 lb 9.6 oz (70.1 kg)  Height: _0  (1.854 m)   Body mass index is 20.4 kg/m.  Physical Exam  GENERAL APPEARANCE: Well nourished. In no acute distress. Normal body habitus SKIN:  Skin is warm and dry.  MOUTH and THROAT: Lips are without lesions. Oral mucosa is moist and without lesions. Tongue is normal in shape, size, and color and without lesions RESPIRATORY: Breathing is even & unlabored, BS CTAB CARDIAC: RRR, no murmur,no extra heart sounds, BLE trace edema GI: Abdomen soft, normal BS, no masses, no tenderness EXTREMITIES:  Able to move X 4 extremities NEUROLOGICAL: There is no tremor. Speech is clear. Alert and oriented X 3. PSYCHIATRIC:  Affect and behavior are appropriate  Labs reviewed: Recent Labs    04/16/19 2027 01/02/20 0000 01/10/20 0000 01/29/20 0000 02/06/20 0000  NA 137   < > 136* 134* 132*  K 3.3*   < > 4.1 4.0 4.2  CL 104   < > 108 109* 105  CO2 23   < > _1 GLUCOSE 171*  --   --   --   --   BUN 17   < > 65* 70* 70*  CREATININE 0.96   < > 1.5* 1.5* 1.5*  CALCIUM 9.0   < > 9.0 8.5* 8.6*   < > = values in this interval not displayed.   Recent Labs    04/16/19 2027 01/02/20 0000 01/10/20 0000  AST 19 36 34  ALT 14 42* 45*  ALKPHOS 71 93 96  BILITOT 0.5  --   --   PROT 7.6  --   --   ALBUMIN 3.7 3.8 4.0   Recent Labs    04/16/19 2027 01/02/20 0000 02/06/20 0000  WBC 7.6 7.0 21.2  NEUTROABS 4._2 HGB 11.2* 9.6* 9.1*  HCT 33.4* 29* 27*  MCV 82.1  --   --   PLT 211 172 258   Lab Results  Component Value Date   TSH 5.20 01/02/2020   Lab Results  Component Value Date   HGBA1C 7.6 01/02/2020    Assessment/Plan  1. Urinary tract infection without hematuria, site unspecified  -Continue Cipro for total of 7 days  2. Diabetes  mellitus with stage 3 chronic kidney  disease [facility]) Lab Results  Component Value Date   HGBA1C 7.6 01/02/2020   -Stable, continue NovoLog 5 units ACHS for CBG >200  3. Chronic diastolic heart failure (HCC) - No SOB, continue furosemide and metoprolol succinate  4. 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 Essential (primary) hypertension -Controlled, continue metoprolol succinate and losartan  6. Benign fibroma of prostate -Denies urinary retention, continue finasteride and tamsulosin   Family/ staff Communication: Discussed plan of care with resident and charge nurse.  Labs/tests ordered: None  Goals of care:   Long-term care   Durenda Age, DNP, FNP-BC [facility] [phone] (Monday-Friday 8:00 a.m. - 5:00 p.m.) [phone] (after hours)

## 2020-03-17 ENCOUNTER — Encounter: Payer: Self-pay | Admitting: Adult Health

## 2020-03-17 ENCOUNTER — Non-Acute Institutional Stay (SKILLED_NURSING_FACILITY): Payer: Medicare Other | Admitting: Adult Health

## 2020-03-17 DIAGNOSIS — I5032 Chronic diastolic (congestive) heart failure: Secondary | ICD-10-CM | POA: Diagnosis not present

## 2020-03-17 DIAGNOSIS — C9010 Plasma cell leukemia not having achieved remission: Secondary | ICD-10-CM

## 2020-03-17 DIAGNOSIS — I10 Essential (primary) hypertension: Secondary | ICD-10-CM | POA: Diagnosis not present

## 2020-03-17 DIAGNOSIS — F5101 Primary insomnia: Secondary | ICD-10-CM

## 2020-03-17 DIAGNOSIS — N183 Chronic kidney disease, stage 3 unspecified: Secondary | ICD-10-CM

## 2020-03-17 DIAGNOSIS — E1122 Type 2 diabetes mellitus with diabetic chronic kidney disease: Secondary | ICD-10-CM

## 2020-03-17 DIAGNOSIS — N400 Enlarged prostate without lower urinary tract symptoms: Secondary | ICD-10-CM

## 2020-03-17 NOTE — Progress Notes (Signed)
Location:  [place] Room Number: 113/A Place of Service:  SNF (31) Provider:  Durenda Age, DNP, FNP-BC  Patient Care Team: Beckie Salts, MD as PCP - General (Internal Medicine)  Extended Emergency Contact Information Primary Emergency Contact: Nappier,[name] Address: [place], [place] [address] [name] of [place] Phone: [phone] Mobile Phone: [phone] Relation: Son Secondary Emergency Contact: [name] [place] of [place] Mobile Phone: [phone] Relation: Other  Code Status:  Full Code  Goals of care: Advanced Directive information Advanced Directives 03/17/2020  Does Patient Have a Medical Advance Directive? Yes  Type of Advance Directive (No Data)  Does patient want to make changes to medical advance directive? No - Patient declined  Would patient like information on creating a medical advance directive? -  Pre-existing out of facility DNR order (yellow form or pink MOST form) -     Chief Complaint  Patient presents with  . Medical Management of Chronic Issues    Routine visit of medical management    HPI:  Pt is a 79 y.o. male seen today for medical management of chronic diseases. He is a long-term care resident of [facility].  He has a PMH of CHF, diabetes mellitus, BPH and hyperlipidemia. He was seen in the room today. He had a fall on 03/11/20. He was seen sitting tly completed Cipro treatment X 7 days for UTI. No complaints of dysuria. No reported SOB. He takes Furosemide and metoprolol for chronic diastolic CHF. SBPs ranging from 115 to 132. He takes Losartan and Metoprolol succinate for hypertension.      Past Medical History:  Diagnosis Date  . Diabetes mellitus with neuropathy ([place])   . Hyperlipidemia   . Hypertension   . Leukemia ([place])    on Sprycel  . MRSA infection 09/19/2015   left arm  . Urinary retention 01/11/2014   Past Surgical History:    Procedure Laterality Date  . APPENDECTOMY    . BACK SURGERY    . CHOLECYSTECTOMY    . EYE SURGERY    . INCISE AND DRAIN ABCESS Left 09/16/2015   forearm; debscess undere MAC; [facility]; Dr Curley Spice  . INCISION AND DRAINAGE ABSCESS Left 09/19/15   forearm wound washout with pulavac at [facility] by Dr Curley Spice    Allergies  Allergen Reactions  . Influenza Vaccines Hives  . Aspirin Other (See Comments)    DIZZINESS, UNCONSCIOUSNESS 09/16/15 pt takes 81 mg aspirin at home.[name], RPh  . Cyclobenzaprine Hives  . Dextromethorphan Other (See Comments)    Unknown   . Doxycycline Nausea Only  . Flu Virus Vaccine   . Gabapentin Other (See Comments)    GI UPSET,DROWSINESS  . Glipizide Other (See Comments)    DIZZINESS  . Hydrocodone-Acetaminophen Other (See Comments)    Unknown   . Iodinated Diagnostic Agents Other (See Comments)    Unknown  Unknown   . Latex Other (See Comments)    Unknown   . Lisinopril Other (See Comments)    COUGH  . Pregabalin Other (See Comments)    GI UPSET,DROWSINESS  . Ropinirole Nausea Only  . Salicylates Other (See Comments)    Unknown   . Sulfamethoxazole-Trimethoprim Nausea Only  . Oxycodone-Acetaminophen Hives and Nausea Only  . Penicillins Hives, Itching and Rash    Has patient had a PCN reaction causing immediate rash, facial/tongue/throat swelling, SOB or lightheadedness with hypotension: Yes Has patient  had a PCN reaction causing severe rash involving mucus membranes or skin necrosis: No Has patient had a PCN reaction that required hospitalization: No Has patient had a PCN reaction occurring within the last 10 years: No If all of the above answers are "NO", then may proceed with Cephalosporin use.     Outpatient Encounter Medications as of 03/17/2020  Medication Sig  . acetaminophen (TYLENOL) 325 MG tablet Take 650 mg by mouth every 8 (eight) hours as needed for mild pain, moderate pain or headache.   Marland Kitchen acetaminophen  (TYLENOL) 325 MG tablet Take 650 mg by mouth in the morning and at bedtime.   Marland Kitchen azelastine (ASTELIN) 0.1 % nasal spray Place 1 spray into both nostrils 2 (two) times daily as needed for rhinitis or allergies.  . bisacodyl (DULCOLAX) 10 MG suppository If not relieved by MOM, give 10 mg Bisacodyl suppositiory rectally X 1 dose in 24 hours as needed (Do not use constipation standing orders for residents with renal failure/CFR less than 30. Contact MD for orders) (Physician Order)  . dasatinib (SPRYCEL) 20 MG tablet Take 2.5 tablets (50 mg total) by mouth daily. Take 2.5 tablets to = 50 mg  . ergocalciferol (VITAMIN D2) 1.25 MG (50000 UT) capsule Take 1 capsule (50,000 Units total) by mouth once a week. FOR VITAMIN D DEFICIENCY (DO NOT CRUSH)  . finasteride (PROSCAR) 5 MG tablet Take 1 tablet (5 mg total) by mouth daily.  . furosemide (LASIX) 20 MG tablet Take 1 tablet (20 mg total) by mouth daily.  . hydrocortisone cream 1 % Apply 1 application topically. APPLY TOPICALLY TO EXTERNALLY HEMORRHOIDS/ANAL AREA TWICE DAILY AS NEEDED  . insulin aspart (NOVOLOG FLEXPEN) 100 UNIT/ML FlexPen Inject 5 Units into the skin 2 (two) times daily with a meal.   . loperamide (IMODIUM A-D) 2 MG tablet LOPERAMIDE 2 MG TABLET-INITIAL DOSE GIVE 2 TAQBS (9m) PO AFTER FIRST LOOSE STOOL,PRN  . losartan (COZAAR) 25 MG tablet Take 1 tablet (25 mg total) by mouth daily.  . magnesium hydroxide (MILK OF MAGNESIA) 400 MG/5ML suspension If no BM in 3 days, give 30 cc Milk of Magnesium p.o. x 1 dose in 24 hours as needed (Do not use standing constipation orders for residents with renal failure CFR less than 30. Contact MD for orders) (Physician Order)  . melatonin 3 MG TABS tablet Take 1 tablet (3 mg total) by mouth at bedtime.  . metoprolol succinate (TOPROL-XL) 50 MG 24 hr tablet Take 1 tablet (50 mg total) by mouth daily. Take with or immediately following a meal.  . nitroGLYCERIN (NITROSTAT) 0.4 MG SL tablet Place 1 tablet (0.4 mg  total) under the tongue every 5 (five) minutes as needed for chest pain.  . NON FORMULARY MECHANICAL SOFT NAS  . Nutritional Supplement LIQD Take 120 mLs by mouth in the morning and at bedtime. MedPass  . ondansetron (ZOFRAN) 4 MG tablet Take 4 mg by mouth every 6 (six) hours as needed for nausea or vomiting.  . Sodium Phosphates (RA SALINE ENEMA RE) If not relieved by Biscodyl suppository, give disposable Saline Enema rectally X 1 dose/24 hrs as needed (Do not use constipation standing orders for residents with renal failure/CFR less than 30. Contact MD for orders)(Physician Or  . tamsulosin (FLOMAX) 0.4 MG CAPS capsule Take 1 capsule (0.4 mg total) by mouth daily.   No facility-administered encounter medications on file as of 03/17/2020.    Review of Systems  GENERAL: No change in appetite, no fatigue, no weight  changes, no fever, chills or weakness MOUTH and THROAT: Denies oral discomfort, gingival pain or bleeding RESPIRATORY: no cough, SOB, DOE, wheezing, hemoptysis CARDIAC: No chest pain or palpitations GI: No abdominal pain, diarrhea, constipation, heart burn, nausea or vomiting GU: Denies dysuria, frequency, hematuria, incontinence, or discharge NEUROLOGICAL: Denies dizziness, syncope, numbness, or headache PSYCHIATRIC: Denies feelings of depression or anxiety. No report of hallucinations, insomnia, paranoia, or agitation   Immunization History  Administered Date(s) Administered  . Influenza, Seasonal, Injecte, Preservative Fre 08/21/2012  . Influenza-Unspecified 08/21/2012  . Moderna SARS-COVID-2 Vaccination 12/24/2019  . Pneumococcal Conjugate-13 02/28/2015, 11/25/2015  . Pneumococcal Polysaccharide-23 08/26/2016   Pertinent  Health Maintenance Due  Topic Date Due  . FOOT EXAM  Never done  . OPHTHALMOLOGY EXAM  Never done  . INFLUENZA VACCINE  03/16/2020  . HEMOGLOBIN A1C  07/04/2020  . PNA vac Low Risk Adult  Completed   No flowsheet data found.   Vitals:   03/17/20  1559  BP: 128/72  Pulse: 72  Resp: 20  Temp: (!) 97.5 F (36.4 C)  TempSrc: Oral  SpO2: 96%  Weight: 169 lb 6.4 oz (76.8 kg)  Height: _0  (1.854 m)   Body mass index is 22.35 kg/m.  Physical Exam  GENERAL APPEARANCE: Well nourished. In no acute distress. Normal body habitus SKIN:  Skin is warm and dry.  MOUTH and THROAT: Lips are without lesions. Oral mucosa is moist and without lesions. Tongue is normal in shape, size, and color and without lesions RESPIRATORY: Breathing is even & unlabored, BS CTAB CARDIAC: RRR, no murmur,no extra heart sounds, BLE trace edema GI: Abdomen soft, normal BS, no masses, no tenderness EXTREMITIES:  Able to move X 4 extremities NEUROLOGICAL: There is no tremor. Speech is clear. Alert to self and time, disoriented to place. PSYCHIATRIC:  Affect and behavior are appropriate  Labs reviewed: Recent Labs    04/16/19 2027 01/02/20 0000 01/10/20 0000 01/29/20 0000 02/06/20 0000  NA 137   < > 136* 134* 132*  K 3.3*   < > 4.1 4.0 4.2  CL 104   < > 108 109* 105  CO2 23   < > _1 GLUCOSE 171*  --   --   --   --   BUN 17   < > 65* 70* 70*  CREATININE 0.96   < > 1.5* 1.5* 1.5*  CALCIUM 9.0   < > 9.0 8.5* 8.6*   < > = values in this interval not displayed.   Recent Labs    04/16/19 2027 01/02/20 0000 01/10/20 0000  AST 19 36 34  ALT 14 42* 45*  ALKPHOS 71 93 96  BILITOT 0.5  --   --   PROT 7.6  --   --   ALBUMIN 3.7 3.8 4.0   Recent Labs    04/16/19 2027 01/02/20 0000 02/06/20 0000  WBC 7.6 7.0 21.2  NEUTROABS 4._2 HGB 11.2* 9.6* 9.1*  HCT 33.4* 29* 27*  MCV 82.1  --   --   PLT 211 172 258   Lab Results  Component Value Date   TSH 5.20 01/02/2020   Lab Results  Component Value Date   HGBA1C 7.6 01/02/2020    Assessment/Plan  1. Essential (primary) hypertension - stable, continue Losartan and Metoprolol succinate  2. 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 Chronic diastolic heart failure (HCC) - no SOB, continue Furosemide  4. 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 Cruzita Lederer at [facility]  5. Benign fibroma of prostate - denies urinary retention, continue Finasteride and Tamsulosin  6. Primary insomnia - continue Melatonin     Family/ staff Communication:  Discussed plan of care with resident and charge nurse.  Labs/tests ordered:  None  Goals of care:   Long-term care   Durenda Age, DNP, MSN, FNP-BC [facility] [phone] (Monday-Friday 8:00 a.m. - 5:00 p.m.) [phone] (after hours)

## 2020-03-28 ENCOUNTER — Encounter: Payer: Self-pay | Admitting: Adult Health

## 2020-03-28 ENCOUNTER — Non-Acute Institutional Stay (SKILLED_NURSING_FACILITY): Payer: Medicare Other | Admitting: Adult Health

## 2020-03-28 DIAGNOSIS — I5032 Chronic diastolic (congestive) heart failure: Secondary | ICD-10-CM | POA: Diagnosis not present

## 2020-03-28 DIAGNOSIS — M79672 Pain in left foot: Secondary | ICD-10-CM | POA: Diagnosis not present

## 2020-03-28 NOTE — Progress Notes (Signed)
Location:  [place] Room Number: 113/A Place of Service:  SNF (31) Provider:  Durenda Age, DNP, FNP-BC  Patient Care Team: Beckie Salts, MD as PCP - General (Internal Medicine)  Extended Emergency Contact Information Primary Emergency Contact: Asche,[name] Address: [place], [place] [address] [name] of [place] Phone: [phone] Mobile Phone: [phone] Relation: Son Secondary Emergency Contact: [name] [place] of [place] Mobile Phone: [phone] Relation: Other  Code Status:  Full Code  Goals of care: Advanced Directive information Advanced Directives 03/28/2020  Does Patient Have a Medical Advance Directive? Yes  Type of Advance Directive (No Data)  Does patient want to make changes to medical advance directive? No - Patient declined  Would patient like information on creating a medical advance directive? -  Pre-existing out of facility DNR order (yellow form or pink MOST form) -     Chief Complaint  Patient presents with  . Acute Visit    Left Foot Pain    HPI:  Pt is a 79 y.o. male seen today for left foot pain.  He is a long-term care resident of [facility].  He has a PMH of CHF, diabetes mellitus, BPH and hyperlipidemia.  He complained of left foot/big toe tenderness.  Left 1st toe is tender to touch. NO erythema was noted. He denies hitting his left foot. He verbalized that he has history of gout.  He currently takes PRN acetaminophen for pain.   Past Medical History:  Diagnosis Date  . Diabetes mellitus with neuropathy ([place])   . Hyperlipidemia   . Hypertension   . Leukemia ([place])    on Sprycel  . MRSA infection 09/19/2015   left arm  . Urinary retention 01/11/2014   Past Surgical History:  Procedure Laterality Date  . APPENDECTOMY    . BACK SURGERY    . CHOLECYSTECTOMY    . EYE SURGERY    . INCISE AND DRAIN ABCESS Left 09/16/2015   forearm;  debscess undere MAC; [facility]; Dr Curley Spice  . INCISION AND DRAINAGE ABSCESS Left 09/19/15   forearm wound washout with pulavac at [facility] by Dr Curley Spice    Allergies  Allergen Reactions  . Influenza Vaccines Hives  . Aspirin Other (See Comments)    DIZZINESS, UNCONSCIOUSNESS 09/16/15 pt takes 81 mg aspirin at home.[name], RPh  . Cyclobenzaprine Hives  . Dextromethorphan Other (See Comments)    Unknown   . Doxycycline Nausea Only  . Flu Virus Vaccine   . Gabapentin Other (See Comments)    GI UPSET,DROWSINESS  . Glipizide Other (See Comments)    DIZZINESS  . Hydrocodone-Acetaminophen Other (See Comments)    Unknown   . Iodinated Diagnostic Agents Other (See Comments)    Unknown  Unknown   . Latex Other (See Comments)    Unknown   . Lisinopril Other (See Comments)    COUGH  . Pregabalin Other (See Comments)    GI UPSET,DROWSINESS  . Ropinirole Nausea Only  . Salicylates Other (See Comments)    Unknown   . Sulfamethoxazole-Trimethoprim Nausea Only  . Oxycodone-Acetaminophen Hives and Nausea Only  . Penicillins Hives, Itching and Rash    Has patient had a PCN reaction causing immediate rash, facial/tongue/throat swelling, SOB or lightheadedness with hypotension: Yes Has patient had a PCN reaction causing severe rash involving mucus membranes or skin necrosis: No Has patient had a PCN reaction that required hospitalization: No  Has patient had a PCN reaction occurring within the last 10 years: No If all of the above answers are "NO", then may proceed with Cephalosporin use.     Outpatient Encounter Medications as of 03/28/2020  Medication Sig  . acetaminophen (TYLENOL) 325 MG tablet Take 650 mg by mouth every 8 (eight) hours as needed for mild pain, moderate pain or headache.   Marland Kitchen acetaminophen (TYLENOL) 325 MG tablet Take 650 mg by mouth in the morning and at bedtime.   Marland Kitchen azelastine (ASTELIN) 0.1 % nasal spray Place 1 spray into both nostrils 2 (two) times  daily as needed for rhinitis or allergies.  . bisacodyl (DULCOLAX) 10 MG suppository If not relieved by MOM, give 10 mg Bisacodyl suppositiory rectally X 1 dose in 24 hours as needed (Do not use constipation standing orders for residents with renal failure/CFR less than 30. Contact MD for orders) (Physician Order)  . dasatinib (SPRYCEL) 50 MG tablet Take 50 mg by mouth daily.  . ergocalciferol (VITAMIN D2) 1.25 MG (50000 UT) capsule Take 1 capsule (50,000 Units total) by mouth once a week. FOR VITAMIN D DEFICIENCY (DO NOT CRUSH)  . finasteride (PROSCAR) 5 MG tablet Take 1 tablet (5 mg total) by mouth daily.  . furosemide (LASIX) 20 MG tablet Take 1 tablet (20 mg total) by mouth daily.  . hydrocortisone cream 1 % Apply 1 application topically. APPLY TOPICALLY TO EXTERNALLY HEMORRHOIDS/ANAL AREA TWICE DAILY AS NEEDED  . insulin aspart (NOVOLOG FLEXPEN) 100 UNIT/ML FlexPen Inject 5 Units into the skin 2 (two) times daily with a meal.   . loperamide (IMODIUM A-D) 2 MG tablet LOPERAMIDE 2 MG TABLET-INITIAL DOSE GIVE 2 TAQBS (34m) PO AFTER FIRST LOOSE STOOL,PRN  . losartan (COZAAR) 25 MG tablet Take 1 tablet (25 mg total) by mouth daily.  . magnesium hydroxide (MILK OF MAGNESIA) 400 MG/5ML suspension If no BM in 3 days, give 30 cc Milk of Magnesium p.o. x 1 dose in 24 hours as needed (Do not use standing constipation orders for residents with renal failure CFR less than 30. Contact MD for orders) (Physician Order)  . melatonin 3 MG TABS tablet Take 1 tablet (3 mg total) by mouth at bedtime.  . metoprolol succinate (TOPROL-XL) 50 MG 24 hr tablet Take 1 tablet (50 mg total) by mouth daily. Take with or immediately following a meal.  . nitroGLYCERIN (NITROSTAT) 0.4 MG SL tablet Place 1 tablet (0.4 mg total) under the tongue every 5 (five) minutes as needed for chest pain.  . NON FORMULARY MECHANICAL SOFT NAS  . Nutritional Supplement LIQD Take 120 mLs by mouth in the morning and at bedtime. MedPass  .  ondansetron (ZOFRAN) 4 MG tablet Take 4 mg by mouth every 6 (six) hours as needed for nausea or vomiting.  . Sodium Phosphates (RA SALINE ENEMA RE) If not relieved by Biscodyl suppository, give disposable Saline Enema rectally X 1 dose/24 hrs as needed (Do not use constipation standing orders for residents with renal failure/CFR less than 30. Contact MD for orders)(Physician Or  . tamsulosin (FLOMAX) 0.4 MG CAPS capsule Take 1 capsule (0.4 mg total) by mouth daily.  . [DISCONTINUED] dasatinib (SPRYCEL) 20 MG tablet Take 2.5 tablets (50 mg total) by mouth daily. Take 2.5 tablets to = 50 mg   No facility-administered encounter medications on file as of 03/28/2020.    Review of Systems  GENERAL: No change in appetite, no fatigue, no weight changes, no fever, chills or weakness MOUTH and THROAT: Denies  oral discomfort, gingival pain or bleeding RESPIRATORY: no cough, SOB, DOE, wheezing, hemoptysis CARDIAC: No chest pain, edema or palpitations GI: No abdominal pain, diarrhea, constipation, heart burn, nausea or vomiting GU: Denies dysuria, frequency, hematuria, incontinence, or discharge NEUROLOGICAL: Denies dizziness, syncope, numbness, or headache PSYCHIATRIC: Denies feelings of depression or anxiety. No report of hallucinations, insomnia, paranoia, or agitation   Immunization History  Administered Date(s) Administered  . Influenza, Seasonal, Injecte, Preservative Fre 08/21/2012  . Influenza-Unspecified 08/21/2012  . Moderna SARS-COVID-2 Vaccination 12/24/2019  . Pneumococcal Conjugate-13 02/28/2015, 11/25/2015  . Pneumococcal Polysaccharide-23 08/26/2016   Pertinent  Health Maintenance Due  Topic Date Due  . FOOT EXAM  Never done  . OPHTHALMOLOGY EXAM  Never done  . INFLUENZA VACCINE  03/16/2020  . HEMOGLOBIN A1C  07/04/2020  . PNA vac Low Risk Adult  Completed   No flowsheet data found.   Vitals:   03/28/20 1522  BP: 110/69  Pulse: (!) 53  Resp: 16  Temp: 97.7 F (36.5 C)    TempSrc: Oral  SpO2: 96%  Weight: 167 lb 12.8 oz (76.1 kg)  Height: _0  (1.854 m)   Body mass index is 22.14 kg/m.  Physical Exam  GENERAL APPEARANCE: Well nourished. In no acute distress. Normal body habitus SKIN:  Skin is warm and dry.  MOUTH and THROAT: Lips are without lesions. Oral mucosa is moist and without lesions. Tongue is normal in shape, size, and color and without lesions RESPIRATORY: Breathing is even & unlabored, BS CTAB CARDIAC: RRR, no murmur,no extra heart sounds, no edema GI: Abdomen soft, normal BS, no masses, no tenderness EXTREMITIES:  Able to move X 4 extremities NEUROLOGICAL: There is no tremor. Speech is clear.  PSYCHIATRIC:  Affect and behavior are appropriate  Labs reviewed: Recent Labs    04/16/19 2027 01/02/20 0000 01/10/20 0000 01/29/20 0000 02/06/20 0000  NA 137   < > 136* 134* 132*  K 3.3*   < > 4.1 4.0 4.2  CL 104   < > 108 109* 105  CO2 23   < > _1 GLUCOSE 171*  --   --   --   --   BUN 17   < > 65* 70* 70*  CREATININE 0.96   < > 1.5* 1.5* 1.5*  CALCIUM 9.0   < > 9.0 8.5* 8.6*   < > = values in this interval not displayed.   Recent Labs    04/16/19 2027 01/02/20 0000 01/10/20 0000  AST 19 36 34  ALT 14 42* 45*  ALKPHOS 71 93 96  BILITOT 0.5  --   --   PROT 7.6  --   --   ALBUMIN 3.7 3.8 4.0   Recent Labs    04/16/19 2027 01/02/20 0000 02/06/20 0000  WBC 7.6 7.0 21.2  NEUTROABS 4._2 HGB 11.2* 9.6* 9.1*  HCT 33.4* 29* 27*  MCV 82.1  --   --   PLT 211 172 258   Lab Results  Component Value Date   TSH 5.20 01/02/2020   Lab Results  Component Value Date   HGBA1C 7.6 01/02/2020     Assessment/Plan  1. Left foot pain -  He denies trauma to left foot -  Will get imaging of left foot to rule out fracture -  Will get uric acid level to rule out  Gout -  Continue PRN Tylenol  2. Chronic diastolic heart failure (HCC) - no SOB, continue Furosemide and Metoprolol  succinate ER    Family/ staff  Communication: Discussed discussed plan of care with resident and charge nurse.  Labs/tests ordered: Uric acid and x-ray of left foot  Goals of care:   Long-term care   Durenda Age, DNP, MSN, FNP-BC [facility] and [facility] [phone] (Monday-Friday 8:00 a.m. - 5:00 p.m.) [phone] (after hours)

## 2020-04-17 ENCOUNTER — Non-Acute Institutional Stay (SKILLED_NURSING_FACILITY): Payer: Medicare Other | Admitting: Adult Health

## 2020-04-17 ENCOUNTER — Encounter: Payer: Self-pay | Admitting: Adult Health

## 2020-04-17 DIAGNOSIS — E1165 Type 2 diabetes mellitus with hyperglycemia: Secondary | ICD-10-CM | POA: Diagnosis not present

## 2020-04-17 DIAGNOSIS — C9010 Plasma cell leukemia not having achieved remission: Secondary | ICD-10-CM | POA: Diagnosis not present

## 2020-04-17 DIAGNOSIS — I10 Essential (primary) hypertension: Secondary | ICD-10-CM

## 2020-04-17 DIAGNOSIS — N400 Enlarged prostate without lower urinary tract symptoms: Secondary | ICD-10-CM

## 2020-04-17 DIAGNOSIS — I5032 Chronic diastolic (congestive) heart failure: Secondary | ICD-10-CM

## 2020-04-17 DIAGNOSIS — F5101 Primary insomnia: Secondary | ICD-10-CM

## 2020-04-17 DIAGNOSIS — Z794 Long term (current) use of insulin: Secondary | ICD-10-CM

## 2020-04-17 NOTE — Progress Notes (Signed)
Location:  [place] Room Number: 113-A Place of Service:  SNF (31) Provider:  Durenda Age, DNP, FNP-BC  Patient Care Team: Hendricks Limes, MD as PCP - General (Internal Medicine) Medina-Vargas, Senaida Lange, NP as Nurse Practitioner (Internal Medicine) Rehab, [facility] ([place])  Extended Emergency Contact Information Primary Emergency Contact: Osika,[name] Address: [place], [place] [address] [name] of [place] Phone: [phone] Mobile Phone: [phone] Relation: Son Secondary Emergency Contact: [name] [place] of [place] Mobile Phone: [phone] Relation: Other  Code Status:  FULL CODE  Goals of care: Advanced Directive information Advanced Directives 03/28/2020  Does Patient Have a Medical Advance Directive? Yes  Type of Advance Directive (No Data)  Does patient want to make changes to medical advance directive? No - Patient declined  Would patient like information on creating a medical advance directive? -  Pre-existing out of facility DNR order (yellow form or pink MOST form) -     Chief Complaint  Patient presents with  . Medical Management of Chronic Issues    Routine [facility] visit    HPI:  Pt is a 79 y.o. male seen today for medical management of chronic diseases. He is a long-term care resident of [facility]. He has a PMH of CHF, diabetes mellitus, BPH and hyperlipidemia. He was seen in his room today. He is currently participating in restorative programs: 1)  AROM by performing Nu-step level 5 for 15 minutes and transfer exercises by performing sit to stand at parallel bars. No SOB and currently on room air. He takes Furosemide 20 mg daily and Metoprolol succinate 50 mg daily. SBPs ranging from 102 to 137. She takes Losartan 25 mg daily and Metoprolol succinate 50 mg daily for hypertension. CBGs ranging from 126 to 282. He is  currently taking Novolog 5 units ACHS for CBGs >200.     Past Medical History:  Diagnosis Date  . Diabetes mellitus with neuropathy ([place])   . Hyperlipidemia   . Hypertension   . Leukemia ([place])    on Sprycel  . MRSA infection 09/19/2015   left arm  . Urinary retention 01/11/2014   Past Surgical History:  Procedure Laterality Date  . APPENDECTOMY    . BACK SURGERY    . CHOLECYSTECTOMY    . EYE SURGERY    . INCISE AND DRAIN ABCESS Left 09/16/2015   forearm; debscess undere MAC; [facility]; Dr Curley Spice  . INCISION AND DRAINAGE ABSCESS Left 09/19/15   forearm wound washout with pulavac at [facility] by Dr Curley Spice    Allergies  Allergen Reactions  . Influenza Vaccines Hives  . Aspirin Other (See Comments)    DIZZINESS, UNCONSCIOUSNESS 09/16/15 pt takes 81 mg aspirin at home.[name], RPh  . Cyclobenzaprine Hives  . Dextromethorphan Other (See Comments)    Unknown   . Doxycycline Nausea Only  . Flu Virus Vaccine   . Gabapentin Other (See Comments)    GI UPSET,DROWSINESS  . Glipizide Other (See Comments)    DIZZINESS  . Hydrocodone-Acetaminophen Other (See Comments)    Unknown   . Iodinated Diagnostic Agents Other (See Comments)    Unknown  Unknown   . Latex Other (See Comments)    Unknown   . Lisinopril Other (See Comments)    COUGH  . Pregabalin Other (See Comments)    GI UPSET,DROWSINESS  . Ropinirole Nausea Only  . Salicylates  Other (See Comments)    Unknown   . Sulfamethoxazole-Trimethoprim Nausea Only  . Oxycodone-Acetaminophen Hives and Nausea Only  . Penicillins Hives, Itching and Rash    Has patient had a PCN reaction causing immediate rash, facial/tongue/throat swelling, SOB or lightheadedness with hypotension: Yes Has patient had a PCN reaction causing severe rash involving mucus membranes or skin necrosis: No Has patient had a PCN reaction that required hospitalization: No Has patient had a PCN reaction occurring within the last 10 years:  No If all of the above answers are "NO", then may proceed with Cephalosporin use.     Outpatient Encounter Medications as of 04/17/2020  Medication Sig  . acetaminophen (TYLENOL) 325 MG tablet Take 650 mg by mouth every 8 (eight) hours as needed for mild pain, moderate pain or headache.   Marland Kitchen acetaminophen (TYLENOL) 325 MG tablet Take 650 mg by mouth in the morning and at bedtime.   Marland Kitchen azelastine (ASTELIN) 0.1 % nasal spray Place 1 spray into both nostrils 2 (two) times daily as needed for rhinitis or allergies.  . bisacodyl (DULCOLAX) 10 MG suppository If not relieved by MOM, give 10 mg Bisacodyl suppositiory rectally X 1 dose in 24 hours as needed (Do not use constipation standing orders for residents with renal failure/CFR less than 30. Contact MD for orders) (Physician Order)  . dasatinib (SPRYCEL) 50 MG tablet Take 50 mg by mouth daily.  . ergocalciferol (VITAMIN D2) 1.25 MG (50000 UT) capsule Take 1 capsule (50,000 Units total) by mouth once a week. FOR VITAMIN D DEFICIENCY (DO NOT CRUSH)  . finasteride (PROSCAR) 5 MG tablet Take 1 tablet (5 mg total) by mouth daily.  . furosemide (LASIX) 20 MG tablet Take 1 tablet (20 mg total) by mouth daily.  . hydrocortisone cream 1 % Apply 1 application topically. APPLY TOPICALLY TO EXTERNALLY HEMORRHOIDS/ANAL AREA TWICE DAILY AS NEEDED  . insulin aspart (NOVOLOG FLEXPEN) 100 UNIT/ML FlexPen Inject 5 Units into the skin 2 (two) times daily with a meal.   . loperamide (IMODIUM A-D) 2 MG tablet LOPERAMIDE 2 MG TABLET-INITIAL DOSE GIVE 2 TAQBS (24m) PO AFTER FIRST LOOSE STOOL,PRN  . losartan (COZAAR) 25 MG tablet Take 1 tablet (25 mg total) by mouth daily.  . magnesium hydroxide (MILK OF MAGNESIA) 400 MG/5ML suspension If no BM in 3 days, give 30 cc Milk of Magnesium p.o. x 1 dose in 24 hours as needed (Do not use standing constipation orders for residents with renal failure CFR less than 30. Contact MD for orders) (Physician Order)  . melatonin 3 MG TABS  tablet Take 1 tablet (3 mg total) by mouth at bedtime.  . metoprolol succinate (TOPROL-XL) 50 MG 24 hr tablet Take 1 tablet (50 mg total) by mouth daily. Take with or immediately following a meal.  . nitroGLYCERIN (NITROSTAT) 0.4 MG SL tablet Place 1 tablet (0.4 mg total) under the tongue every 5 (five) minutes as needed for chest pain.  . NON FORMULARY MECHANICAL SOFT NAS  . Nutritional Supplement LIQD Take 120 mLs by mouth in the morning and at bedtime. MedPass  . ondansetron (ZOFRAN) 4 MG tablet Take 4 mg by mouth every 6 (six) hours as needed for nausea or vomiting.  . Sodium Phosphates (RA SALINE ENEMA RE) If not relieved by Biscodyl suppository, give disposable Saline Enema rectally X 1 dose/24 hrs as needed (Do not use constipation standing orders for residents with renal failure/CFR less than 30. Contact MD for orders)(Physician Or  . tamsulosin (FLOMAX) 0.4  MG CAPS capsule Take 1 capsule (0.4 mg total) by mouth daily.   No facility-administered encounter medications on file as of 04/17/2020.    Review of Systems  GENERAL: No change in appetite, no fatigue, no weight changes, no fever, chills or weakness MOUTH and THROAT: Denies oral discomfort, gingival pain or bleeding, pain from teeth or hoarseness   RESPIRATORY: no cough, SOB, DOE, wheezing, hemoptysis CARDIAC: No chest pain or palpitations GI: No abdominal pain, diarrhea, constipation, heart burn, nausea or vomiting GU: Denies dysuria, frequency, hematuria, incontinence, or discharge NEUROLOGICAL: Denies dizziness, syncope, numbness, or headache PSYCHIATRIC: Denies feelings of depression or anxiety. No report of hallucinations, insomnia, paranoia, or agitation   Immunization History  Administered Date(s) Administered  . Influenza, Seasonal, Injecte, Preservative Fre 08/21/2012  . Influenza-Unspecified 08/21/2012  . Moderna SARS-COVID-2 Vaccination 12/24/2019  . Pneumococcal Conjugate-13 02/28/2015, 11/25/2015  . Pneumococcal  Polysaccharide-23 08/26/2016   Pertinent  Health Maintenance Due  Topic Date Due  . FOOT EXAM  Never done  . OPHTHALMOLOGY EXAM  Never done  . INFLUENZA VACCINE  03/16/2020  . HEMOGLOBIN A1C  07/04/2020  . PNA vac Low Risk Adult  Completed    Vitals:   04/17/20 1252  BP: 102/64  Pulse: 74  Resp: 16  Temp: 97.9 F (36.6 C)  TempSrc: Oral  Weight: 170 lb 3.2 oz (77.2 kg)  Height: _0  (1.854 m)   Body mass index is 22.46 kg/m.  Physical Exam  GENERAL APPEARANCE: Well nourished. In no acute distress. Normal body habitus SKIN:  Skin is warm and dry.  MOUTH and THROAT: Lips are without lesions. Oral mucosa is moist and without lesions. Tongue is normal in shape, size, and color and without lesions RESPIRATORY: Breathing is even & unlabored, BS CTAB CARDIAC: RRR, no murmur,no extra heart sounds, left lower extremity 2+edema and RLE 1+ GI: Abdomen soft, normal BS, no masses, no tenderness EXTREMITY:  Able to move X 4 extremities NEUROLOGICAL: There is no tremor. Speech is clear. Alert to self and time, disoriented to place PSYCHIATRIC:  Affect and behavior are appropriate  Labs reviewed: Recent Labs    01/10/20 0000 01/29/20 0000 02/06/20 0000  NA 136* 134* 132*  K 4.1 4.0 4.2  CL 108 109* 105  CO2 _1 BUN 65* 70* 70*  CREATININE 1.5* 1.5* 1.5*  CALCIUM 9.0 8.5* 8.6*   Recent Labs    01/02/20 0000 01/10/20 0000  AST 36 34  ALT 42* 45*  ALKPHOS 93 96  ALBUMIN 3.8 4.0   Recent Labs    01/02/20 0000 02/06/20 0000  WBC 7.0 21.2  NEUTROABS 3 17  HGB 9.6* 9.1*  HCT 29* 27*  PLT 172 258   Lab Results  Component Value Date   TSH 5.20 01/02/2020   Lab Results  Component Value Date   HGBA1C 7.6 01/02/2020    Assessment/Plan  1. Type 2 diabetes mellitus with hyperglycemia, with long-term current use of insulin (HCC) Lab Results  Component Value Date   HGBA1C 7.6 01/02/2020   -  CBGs stable, continue Novolog  2. Benign fibroma of prostate -   Stable, continue Finasteride and Flomax  3. Chronic diastolic heart failure (HCC) - no SOB, continue Furosemide and Metoprolol tartrate  4. 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 Essential (primary) hypertension - stable, continue Losartan and Metoprolol Succinate ER  6. Primary insomnia -  Stable, continue Melatonin  Family/ staff Communication:  Discussed plan of care with resident and charge nurse.   Labs/tests ordered:   None  Goals of care:   Long-term care   Durenda Age, DNP, MSN, FNP-BC [facility] [phone] (Monday-Friday 8:00 a.m. - 5:00 p.m.) [phone] (after hours)

## 2020-05-06 ENCOUNTER — Other Ambulatory Visit: Payer: Self-pay | Admitting: Adult Health

## 2020-05-06 ENCOUNTER — Non-Acute Institutional Stay (SKILLED_NURSING_FACILITY): Payer: Medicare Other | Admitting: Adult Health

## 2020-05-06 ENCOUNTER — Encounter: Payer: Self-pay | Admitting: Adult Health

## 2020-05-06 DIAGNOSIS — N1832 Chronic kidney disease, stage 3b: Secondary | ICD-10-CM

## 2020-05-06 DIAGNOSIS — E790 Hyperuricemia without signs of inflammatory arthritis and tophaceous disease: Secondary | ICD-10-CM | POA: Diagnosis not present

## 2020-05-06 DIAGNOSIS — I5032 Chronic diastolic (congestive) heart failure: Secondary | ICD-10-CM

## 2020-05-06 MED ORDER — ALLOPURINOL 100 MG PO TABS: 100.0000 mg | ORAL_TABLET | Freq: Every day | ORAL | 3 refills | Status: AC

## 2020-05-06 NOTE — Progress Notes (Signed)
Location:  [place] Room Number: 113-A Place of Service:  SNF (31) Provider:  Durenda Age, DNP, FNP-BC  Patient Care Team: Hendricks Limes, MD as PCP - General (Internal Medicine) Medina-Vargas, Senaida Lange, NP as Nurse Practitioner (Internal Medicine) Rehab, [facility] ([place])  Extended Emergency Contact Information Primary Emergency Contact: Mckeever,[name] Address: [place], [place] [address] [name] of [place] Phone: [phone] Mobile Phone: [phone] Relation: Son Secondary Emergency Contact: [name] [place] of [place] Mobile Phone: [phone] Relation: Other  Code Status:  Full Code  Goals of care: Advanced Directive information Advanced Directives 03/28/2020  Does Patient Have a Medical Advance Directive? Yes  Type of Advance Directive (No Data)  Does patient want to make changes to medical advance directive? No - Patient declined  Would patient like information on creating a medical advance directive? -  Pre-existing out of facility DNR order (yellow form or pink MOST form) -     Chief Complaint  Patient presents with  . Acute Visit    Patient is seen for elevated uric acid (8.80)    HPI:  Pt is a 79 y.o. [name] seen today for elevated uric acid, 8.80. He is seen in his room today. He denies having pain on his toes nor finger joints. He is currently not taking Allopurinol. GFR 44.08. No SOB. He takes Furosemide 20 mg daily for CHF. He has PMH of diabetes mellitus, BPH and hyperlipidemia.    Past Medical History:  Diagnosis Date  . Diabetes mellitus with neuropathy ([place])   . Hyperlipidemia   . Hypertension   . Leukemia ([place])    on Sprycel  . MRSA infection 09/19/2015   left arm  . Urinary retention 01/11/2014   Past Surgical History:  Procedure Laterality Date  . APPENDECTOMY    . BACK SURGERY    . CHOLECYSTECTOMY    . EYE SURGERY    . INCISE AND  DRAIN ABCESS Left 09/16/2015   forearm; debscess undere MAC; [facility]; Dr Curley Spice  . INCISION AND DRAINAGE ABSCESS Left 09/19/15   forearm wound washout with pulavac at [facility] by Dr Curley Spice    Allergies  Allergen Reactions  . Influenza Vaccines Hives  . Aspirin Other (See Comments)    DIZZINESS, UNCONSCIOUSNESS 09/16/15 pt takes 81 mg aspirin at home.[name], RPh  . Cyclobenzaprine Hives  . Dextromethorphan Other (See Comments)    Unknown   . Doxycycline Nausea Only  . Flu Virus Vaccine   . Gabapentin Other (See Comments)    GI UPSET,DROWSINESS  . Glipizide Other (See Comments)    DIZZINESS  . Hydrocodone-Acetaminophen Other (See Comments)    Unknown   . Iodinated Diagnostic Agents Other (See Comments)    Unknown  Unknown   . Latex Other (See Comments)    Unknown   . Lisinopril Other (See Comments)    COUGH  . Pregabalin Other (See Comments)    GI UPSET,DROWSINESS  . Ropinirole Nausea Only  . Salicylates Other (See Comments)    Unknown   . Sulfamethoxazole-Trimethoprim Nausea Only  . Oxycodone-Acetaminophen Hives and Nausea Only  . Penicillins Hives, Itching and Rash    Has patient had a PCN reaction causing immediate rash, facial/tongue/throat swelling, SOB or lightheadedness with hypotension: Yes Has patient had a PCN reaction causing severe rash involving mucus membranes or skin necrosis: No Has patient had a PCN reaction that required  hospitalization: No Has patient had a PCN reaction occurring within the last 10 years: No If all of the above answers are "NO", then may proceed with Cephalosporin use.     Outpatient Encounter Medications as of 05/06/2020  Medication Sig  . acetaminophen (TYLENOL) 325 MG tablet Take 650 mg by mouth every 8 (eight) hours as needed for mild pain, moderate pain or headache.   Marland Kitchen acetaminophen (TYLENOL) 325 MG tablet Take 650 mg by mouth in the morning and at bedtime.   Marland Kitchen allopurinol (ZYLOPRIM) 100 MG tablet Take 1  tablet (100 mg total) by mouth daily.  Marland Kitchen azelastine (ASTELIN) 0.1 % nasal spray Place 1 spray into both nostrils 2 (two) times daily as needed for rhinitis or allergies.  . bisacodyl (DULCOLAX) 10 MG suppository If not relieved by MOM, give 10 mg Bisacodyl suppositiory rectally X 1 dose in 24 hours as needed (Do not use constipation standing orders for residents with renal failure/CFR less than 30. Contact MD for orders) (Physician Order)  . dasatinib (SPRYCEL) 50 MG tablet Take 50 mg by mouth daily.  . ergocalciferol (VITAMIN D2) 1.25 MG (50000 UT) capsule Take 1 capsule (50,000 Units total) by mouth once a week. FOR VITAMIN D DEFICIENCY (DO NOT CRUSH)  . ferrous sulfate 325 (65 FE) MG tablet Take 325 mg by mouth daily with breakfast.  . finasteride (PROSCAR) 5 MG tablet Take 1 tablet (5 mg total) by mouth daily.  . furosemide (LASIX) 20 MG tablet Take 1 tablet (20 mg total) by mouth daily.  . hydrocortisone cream 1 % Apply 1 application topically. APPLY TOPICALLY TO EXTERNALLY HEMORRHOIDS/ANAL AREA TWICE DAILY AS NEEDED  . insulin lispro (HUMALOG) 100 UNIT/ML KwikPen Inject 5 Units into the skin as needed (For CBG >200).  Marland Kitchen loperamide (IMODIUM A-D) 2 MG tablet Take 4 mg by mouth. Give 4 mg after first loose stool prn  . losartan (COZAAR) 25 MG tablet Take 1 tablet (25 mg total) by mouth daily.  . magnesium hydroxide (MILK OF MAGNESIA) 400 MG/5ML suspension If no BM in 3 days, give 30 cc Milk of Magnesium p.o. x 1 dose in 24 hours as needed (Do not use standing constipation orders for residents with renal failure CFR less than 30. Contact MD for orders) (Physician Order)  . melatonin 3 MG TABS tablet Take 1 tablet (3 mg total) by mouth at bedtime.  . metoprolol succinate (TOPROL-XL) 50 MG 24 hr tablet Take 1 tablet (50 mg total) by mouth daily. Take with or immediately following a meal.  . nitroGLYCERIN (NITROSTAT) 0.4 MG SL tablet Place 1 tablet (0.4 mg total) under the tongue every 5 (five) minutes  as needed for chest pain.  . NON FORMULARY MECHANICAL SOFT NAS  . Nutritional Supplement LIQD Take 120 mLs by mouth in the morning and at bedtime. MedPass  . ondansetron (ZOFRAN) 4 MG tablet Take 4 mg by mouth every 6 (six) hours as needed for nausea or vomiting.  . Sodium Phosphates (RA SALINE ENEMA RE) If not relieved by Biscodyl suppository, give disposable Saline Enema rectally X 1 dose/24 hrs as needed (Do not use constipation standing orders for residents with renal failure/CFR less than 30. Contact MD for orders)(Physician Or  . tamsulosin (FLOMAX) 0.4 MG CAPS capsule Take 1 capsule (0.4 mg total) by mouth daily.  . [DISCONTINUED] insulin aspart (NOVOLOG FLEXPEN) 100 UNIT/ML FlexPen Inject 5 Units into the skin 2 (two) times daily with a meal.    No facility-administered encounter medications on  file as of 05/06/2020.    Review of Systems  GENERAL: No change in appetite, no fatigue, no fever, chills or weakness MOUTH and THROAT: Denies oral discomfort, gingival pain  RESPIRATORY: no cough, SOB, DOE, wheezing, hemoptysis CARDIAC: No chest pain or palpitations GI: No abdominal pain, diarrhea, constipation, heart burn, nausea or vomiting GU: Denies dysuria, frequency, hematuria, incontinence, or discharge NEUROLOGICAL: Denies dizziness, syncope, numbness, or headache PSYCHIATRIC: Denies feelings of depression or anxiety. No report of hallucinations, insomnia, paranoia, or agitation   Immunization History  Administered Date(s) Administered  . Influenza, Seasonal, Injecte, Preservative Fre 08/21/2012  . Influenza-Unspecified 08/21/2012  . Moderna SARS-COVID-2 Vaccination 12/24/2019  . Pneumococcal Conjugate-13 02/28/2015, 11/25/2015  . Pneumococcal Polysaccharide-23 08/26/2016   Pertinent  Health Maintenance Due  Topic Date Due  . FOOT EXAM  Never done  . OPHTHALMOLOGY EXAM  Never done  . INFLUENZA VACCINE  03/16/2020  . HEMOGLOBIN A1C  07/04/2020  . PNA vac Low Risk Adult   Completed   No flowsheet data found.   Vitals:   05/06/20 1425  BP: 138/79  Pulse: 80  Resp: 18  Temp: (!) 97.4 F (36.3 C)  TempSrc: Oral  Weight: 162 lb 9.6 oz (73.8 kg)  Height: _0  (1.854 m)   Body mass index is 21.45 kg/m.  Physical Exam  GENERAL APPEARANCE: Well nourished. In no acute distress. Normal body habitus SKIN:  Skin is warm and dry.  MOUTH and THROAT: Lips are without lesions. Oral mucosa is moist and without lesions.  RESPIRATORY: Breathing is even & unlabored, BS CTAB CARDIAC: RRR, no murmur,no extra heart sounds GI: Abdomen soft, normal BS, no masses, no tenderness EXTREMITIES:  Able to move X 4 extremities. NEUROLOGICAL: There is no tremor. Speech is clear. PSYCHIATRIC:  Affect and behavior are appropriate  Labs reviewed: Recent Labs    01/10/20 0000 01/29/20 0000 02/06/20 0000  NA 136* 134* 132*  K 4.1 4.0 4.2  CL 108 109* 105  CO2 _1 BUN 65* Jerry* Jerry*  CREATININE 1.5* 1.5* 1.5*  CALCIUM 9.0 8.5* 8.6*   Recent Labs    01/02/20 0000 01/10/20 0000  AST 36 34  ALT 42* 45*  ALKPHOS 93 96  ALBUMIN 3.8 4.0   Recent Labs    01/02/20 0000 02/06/20 0000  WBC 7.0 21.2  NEUTROABS 3 17  HGB 9.6* 9.1*  HCT 29* 27*  PLT 172 258   Lab Results  Component Value Date   TSH 5.20 01/02/2020   Lab Results  Component Value Date   HGBA1C 7.6 01/02/2020    Assessment/Plan  1. Elevated uric acid in blood -  Will start on allopurinol 100 mg daily  2. Chronic diastolic heart failure (HCC) -  No SOB, continue Lasix   3.  Chronic kidney disease stage IIIb Lab Results  Component Value Date   NA 132 (A) 02/06/2020   K 4.2 02/06/2020   CO2 14 02/06/2020   GLUCOSE 171 (H) 04/16/2019   BUN Jerry (A) 02/06/2020   CREATININE 1.5 (A) 02/06/2020   CALCIUM 8.6 (A) 02/06/2020   GFRNONAA 44.08 02/06/2020   GFRAA 51.08 02/06/2020   - stable    Family/ staff Communication:  Discussed plan of care with resident and charge  nurse.  Labs/tests ordered:  None  Goals of care:   Long-term care   Durenda Age, DNP, MSN, FNP-BC [facility] [phone] (Monday-Friday 8:00 a.m. - 5:00 p.m.) [phone] (after hours)

## 2020-05-20 ENCOUNTER — Encounter: Payer: Self-pay | Admitting: Adult Health

## 2020-05-20 ENCOUNTER — Non-Acute Institutional Stay (SKILLED_NURSING_FACILITY): Payer: Medicare Other | Admitting: Adult Health

## 2020-05-20 DIAGNOSIS — N400 Enlarged prostate without lower urinary tract symptoms: Secondary | ICD-10-CM

## 2020-05-20 DIAGNOSIS — I5032 Chronic diastolic (congestive) heart failure: Secondary | ICD-10-CM

## 2020-05-20 DIAGNOSIS — I10 Essential (primary) hypertension: Secondary | ICD-10-CM | POA: Diagnosis not present

## 2020-05-20 DIAGNOSIS — E1122 Type 2 diabetes mellitus with diabetic chronic kidney disease: Secondary | ICD-10-CM

## 2020-05-20 DIAGNOSIS — Z794 Long term (current) use of insulin: Secondary | ICD-10-CM

## 2020-05-20 DIAGNOSIS — N1832 Chronic kidney disease, stage 3b: Secondary | ICD-10-CM

## 2020-05-20 DIAGNOSIS — I2510 Atherosclerotic heart disease of native coronary artery without angina pectoris: Secondary | ICD-10-CM

## 2020-05-20 NOTE — Progress Notes (Signed)
Location:  [place] Room Number: 113-A Place of Service:  SNF (31) Provider:  Durenda Age, DNP, FNP-BC  Patient Care Team: Hendricks Limes, MD as PCP - General (Internal Medicine) Medina-Vargas, Senaida Lange, NP as Nurse Practitioner (Internal Medicine) Rehab, [facility] ([place])  Extended Emergency Contact Information Primary Emergency Contact: Taite,[name] Address: [place], [place] [address] [name] of [place] Phone: [phone] Mobile Phone: [phone] Relation: Son Secondary Emergency Contact: [name] [place] of [place] Mobile Phone: [phone] Relation: Other  Code Status:  FULL CODE  Goals of care: Advanced Directive information Advanced Directives 03/28/2020  Does Patient Have a Medical Advance Directive? Yes  Type of Advance Directive (No Data)  Does patient want to make changes to medical advance directive? No - Patient declined  Would patient like information on creating a medical advance directive? -  Pre-existing out of facility DNR order (yellow form or pink MOST form) -     Chief Complaint  Patient presents with  . Medical Management of Chronic Issues    Routine [facility] visit    HPI:  Pt is a 79 y.o. male seen today for medical management of chronic diseases. He is a long-term care resident of [facility]. He has a PMH of CHF, diabetes mellitus, BPH and hyperlipidemia. No reported SOB. He takes Furosemide 20 mg daily and Metoprolol succinate ER 50 mg daily for chronic diastolic CHF. SBPs ranging from 110 to 152, with outlier 85. He takes  Metoprolol succinate ER 50 mg daily and  Losartan 25 mg daily for hypertension.   Past Medical History:  Diagnosis Date  . Diabetes mellitus with neuropathy ([place])   . Hyperlipidemia   . Hypertension   . Leukemia ([place])    on Sprycel  . MRSA infection 09/19/2015   left arm  . Urinary  retention 01/11/2014   Past Surgical History:  Procedure Laterality Date  . APPENDECTOMY    . BACK SURGERY    . CHOLECYSTECTOMY    . EYE SURGERY    . INCISE AND DRAIN ABCESS Left 09/16/2015   forearm; debscess undere MAC; [facility]; Dr Curley Spice  . INCISION AND DRAINAGE ABSCESS Left 09/19/15   forearm wound washout with pulavac at [facility] by Dr Curley Spice    Allergies  Allergen Reactions  . Influenza Vaccines Hives  . Aspirin Other (See Comments)    DIZZINESS, UNCONSCIOUSNESS 09/16/15 pt takes 81 mg aspirin at home.[name], RPh  . Cyclobenzaprine Hives  . Dextromethorphan Other (See Comments)    Unknown   . Doxycycline Nausea Only  . Flu Virus Vaccine   . Gabapentin Other (See Comments)    GI UPSET,DROWSINESS  . Glipizide Other (See Comments)    DIZZINESS  . Hydrocodone-Acetaminophen Other (See Comments)    Unknown   . Iodinated Diagnostic Agents Other (See Comments)    Unknown  Unknown   . Latex Other (See Comments)    Unknown   . Lisinopril Other (See Comments)    COUGH  . Pregabalin Other (See Comments)    GI UPSET,DROWSINESS  . Ropinirole Nausea Only  . Salicylates Other (See Comments)    Unknown   . Sulfamethoxazole-Trimethoprim Nausea Only  . Oxycodone-Acetaminophen Hives and Nausea Only  . Penicillins Hives, Itching and Rash    Has patient had a PCN reaction causing immediate rash, facial/tongue/throat swelling, SOB or lightheadedness with hypotension: Yes Has  patient had a PCN reaction causing severe rash involving mucus membranes or skin necrosis: No Has patient had a PCN reaction that required hospitalization: No Has patient had a PCN reaction occurring within the last 10 years: No If all of the above answers are "NO", then may proceed with Cephalosporin use.     Outpatient Encounter Medications as of 05/20/2020  Medication Sig  . acetaminophen (TYLENOL) 325 MG tablet Take 650 mg by mouth every 8 (eight) hours as needed for mild pain,  moderate pain or headache.   Marland Kitchen acetaminophen (TYLENOL) 325 MG tablet Take 650 mg by mouth in the morning and at bedtime.   Marland Kitchen allopurinol (ZYLOPRIM) 100 MG tablet Take 1 tablet (100 mg total) by mouth daily.  Marland Kitchen azelastine (ASTELIN) 0.1 % nasal spray Place 1 spray into both nostrils 2 (two) times daily as needed for rhinitis or allergies.  . bisacodyl (DULCOLAX) 10 MG suppository If not relieved by MOM, give 10 mg Bisacodyl suppositiory rectally X 1 dose in 24 hours as needed (Do not use constipation standing orders for residents with renal failure/CFR less than 30. Contact MD for orders) (Physician Order)  . dasatinib (SPRYCEL) 50 MG tablet Take 50 mg by mouth daily.  . ergocalciferol (VITAMIN D2) 1.25 MG (50000 UT) capsule Take 1 capsule (50,000 Units total) by mouth once a week. FOR VITAMIN D DEFICIENCY (DO NOT CRUSH)  . ferrous sulfate 325 (65 FE) MG tablet Take 325 mg by mouth daily with breakfast.  . finasteride (PROSCAR) 5 MG tablet Take 1 tablet (5 mg total) by mouth daily.  . furosemide (LASIX) 20 MG tablet Take 1 tablet (20 mg total) by mouth daily.  . hydrocortisone cream 1 % Apply 1 application topically. APPLY TOPICALLY TO EXTERNALLY HEMORRHOIDS/ANAL AREA TWICE DAILY AS NEEDED  . insulin lispro (HUMALOG) 100 UNIT/ML KwikPen Inject 5 Units into the skin as needed (For CBG >200).  Marland Kitchen loperamide (IMODIUM A-D) 2 MG tablet Take 4 mg by mouth. Give 4 mg after first loose stool prn  . losartan (COZAAR) 25 MG tablet Take 1 tablet (25 mg total) by mouth daily.  . magnesium hydroxide (MILK OF MAGNESIA) 400 MG/5ML suspension If no BM in 3 days, give 30 cc Milk of Magnesium p.o. x 1 dose in 24 hours as needed (Do not use standing constipation orders for residents with renal failure CFR less than 30. Contact MD for orders) (Physician Order)  . melatonin 3 MG TABS tablet Take 1 tablet (3 mg total) by mouth at bedtime.  . metoprolol succinate (TOPROL-XL) 50 MG 24 hr tablet Take 1 tablet (50 mg total) by  mouth daily. Take with or immediately following a meal.  . nitroGLYCERIN (NITROSTAT) 0.4 MG SL tablet Place 1 tablet (0.4 mg total) under the tongue every 5 (five) minutes as needed for chest pain.  . NON FORMULARY MECHANICAL SOFT NAS  . Nutritional Supplement LIQD Take 120 mLs by mouth in the morning and at bedtime. MedPass  . ondansetron (ZOFRAN) 4 MG tablet Take 4 mg by mouth every 6 (six) hours as needed for nausea or vomiting.  . Sodium Phosphates (RA SALINE ENEMA RE) If not relieved by Biscodyl suppository, give disposable Saline Enema rectally X 1 dose/24 hrs as needed (Do not use constipation standing orders for residents with renal failure/CFR less than 30. Contact MD for orders)(Physician Or  . tamsulosin (FLOMAX) 0.4 MG CAPS capsule Take 1 capsule (0.4 mg total) by mouth daily.   No facility-administered encounter medications on file  as of 05/20/2020.    Review of Systems  GENERAL: No change in appetite, no fatigue, no weight changes, no fever, chills or weakness MOUTH and THROAT: Denies oral discomfort, gingival pain or bleeding RESPIRATORY: no cough, SOB, DOE, wheezing, hemoptysis CARDIAC: No chest pain or palpitations GI: No abdominal pain, diarrhea, constipation, heart burn, nausea or vomiting GU: Denies dysuria, frequency, hematuria, incontinence, or discharge NEUROLOGICAL: Denies dizziness, syncope, numbness, or headache PSYCHIATRIC: Denies feelings of depression or anxiety. No report of hallucinations, insomnia, paranoia, or agitation   Immunization History  Administered Date(s) Administered  . Influenza, Seasonal, Injecte, Preservative Fre 08/21/2012  . Influenza-Unspecified 08/21/2012  . Moderna SARS-COVID-2 Vaccination 12/24/2019  . Pneumococcal Conjugate-13 02/28/2015, 11/25/2015  . Pneumococcal Polysaccharide-23 08/26/2016   Pertinent  Health Maintenance Due  Topic Date Due  . FOOT EXAM  Never done  . OPHTHALMOLOGY EXAM  Never done  . INFLUENZA VACCINE   03/16/2020  . HEMOGLOBIN A1C  07/04/2020  . PNA vac Low Risk Adult  Completed    Vitals:   05/20/20 1625  BP: 110/72  Pulse: 90  Resp: 18  Temp: 98.2 F (36.8 C)  TempSrc: Oral  Weight: 161 lb 3.2 oz (73.1 kg)  Height: _0  (1.854 m)   Body mass index is 21.27 kg/m.  Physical Exam  GENERAL APPEARANCE: Well nourished. In no acute distress. Normal body habitus SKIN:  Skin is warm and dry.  MOUTH and THROAT: Lips are without lesions. Oral mucosa is moist and without lesions. Tongue is normal in shape, size, and color and without lesions RESPIRATORY: Breathing is even & unlabored, BS CTAB CARDIAC: RRR, no murmur,no extra heart sounds, BLE trace edema GI: Abdomen soft, normal BS, no masses, no tenderness EXTREMITIES:  Able to move X 4 extremities NEUROLOGICAL: There is no tremor. Speech is clear. Alert to self and time, disoriented to place. PSYCHIATRIC:  Affect and behavior are appropriate  Labs reviewed: Recent Labs    01/10/20 0000 01/29/20 0000 02/06/20 0000  NA 136* 134* 132*  K 4.1 4.0 4.2  CL 108 109* 105  CO2 _1 BUN 65* 70* 70*  CREATININE 1.5* 1.5* 1.5*  CALCIUM 9.0 8.5* 8.6*   Recent Labs    01/02/20 0000 01/10/20 0000  AST 36 34  ALT 42* 45*  ALKPHOS 93 96  ALBUMIN 3.8 4.0   Recent Labs    01/02/20 0000 02/06/20 0000  WBC 7.0 21.2  NEUTROABS 3 17  HGB 9.6* 9.1*  HCT 29* 27*  PLT 172 258   Lab Results  Component Value Date   TSH 5.20 01/02/2020   Lab Results  Component Value Date   HGBA1C 7.6 01/02/2020    Assessment/Plan  1. Chronic diastolic heart failure (HCC) -  No SOB, continue furosemide and metoprolol succinate ER  2. Essential (primary) hypertension -Stable, continue losartan and metoprolol succinate ER  3. Type 2 diabetes mellitus with stage 3b chronic kidney disease, with long-term current use of insulin (HCC) Lab Results  Component Value Date   HGBA1C 7.6 01/02/2020   -Stable, continue Humalog  4. Benign  fibroma of prostate -  Denies urinary retention, continue tamsulosin and finasteride  5. CAD in native artery -Denies chest pain, continue NTG PRN     Family/ staff Communication:   Discussed plan of care with resident and charge nurse.  Labs/tests ordered: None  Goals of care:   Long-term care   Durenda Age, DNP, MSN, FNP-BC [facility] [phone] (  Monday-Friday 8:00 a.m. - 5:00 p.m.) [phone] (after hours)

## 2020-05-28 ENCOUNTER — Non-Acute Institutional Stay (SKILLED_NURSING_FACILITY): Payer: Medicare Other | Admitting: Adult Health

## 2020-05-28 ENCOUNTER — Encounter: Payer: Self-pay | Admitting: Adult Health

## 2020-05-28 DIAGNOSIS — N400 Enlarged prostate without lower urinary tract symptoms: Secondary | ICD-10-CM | POA: Diagnosis not present

## 2020-05-28 DIAGNOSIS — E1122 Type 2 diabetes mellitus with diabetic chronic kidney disease: Secondary | ICD-10-CM | POA: Diagnosis not present

## 2020-05-28 DIAGNOSIS — I10 Essential (primary) hypertension: Secondary | ICD-10-CM

## 2020-05-28 DIAGNOSIS — Z7189 Other specified counseling: Secondary | ICD-10-CM

## 2020-05-28 DIAGNOSIS — I5032 Chronic diastolic (congestive) heart failure: Secondary | ICD-10-CM

## 2020-05-28 DIAGNOSIS — I2510 Atherosclerotic heart disease of native coronary artery without angina pectoris: Secondary | ICD-10-CM

## 2020-05-28 DIAGNOSIS — C9010 Plasma cell leukemia not having achieved remission: Secondary | ICD-10-CM

## 2020-05-28 DIAGNOSIS — N1832 Chronic kidney disease, stage 3b: Secondary | ICD-10-CM

## 2020-05-28 DIAGNOSIS — Z794 Long term (current) use of insulin: Secondary | ICD-10-CM

## 2020-05-28 NOTE — Progress Notes (Signed)
Location:  [place] Room Number: 102-B Place of Service:  SNF (31) Provider:  Durenda Age, DNP, FNP-BC  Patient Care Team: Hendricks Limes, MD as PCP - General (Internal Medicine) Medina-Vargas, Jerry Lange, NP as Nurse Practitioner (Internal Medicine) Rehab, [facility] ([place])  Extended Emergency Contact Information Primary Emergency Contact: Sumners,Jerry [name] Address: [place],  [address] [name] of [place] Phone: [phone] Mobile Phone: [phone] Relation: Son Secondary Emergency Contact: [name] [place] of [place] Mobile Phone: [phone] Relation: Other  Code Status:  FULL CODE  Goals of care: Advanced Directive information Advanced Directives 03/28/2020  Does Patient Have a Medical Advance Directive? Yes  Type of Advance Directive (No Data)  Does patient want to make changes to medical advance directive? No - Patient declined  Would patient like information on creating a medical advance directive? -  Pre-existing out of facility DNR order (yellow form or pink MOST form) -     Chief Complaint  Patient presents with  . Advanced Directive    Patient is seen for a Care Plan Meeting    HPI:  Pt is a 79 y.o. [name] seen today for a care plan meeting.  He is a long-term care resident of [facility].  He has a PMH of congestive heart failure, diabetes mellitus, BPH and hyperlipidemia. The meeting was attended by resident, NP, social worker and MDS coordinator. Jerry [name], resident's son, was called via teleconference but did not answer.  He remains to be full code.  Discussed medications, vital signs and weights.  He currently participates in restorative programs such as 1) AROM exercises by performing NuStep level 5 for 15 minutes and 2) transfers by performing sit to stand at parallel bars. He had a fall on 04/25/20, no injury was noted. Resident's  medical questions were answered. The meeting lasted for 25 minutes.   Past Medical History:  Diagnosis Date  . Diabetes mellitus with neuropathy ([place])   . Hyperlipidemia   . Hypertension   . Leukemia ([place])    on Sprycel  . MRSA infection 09/19/2015   left arm  . Urinary retention 01/11/2014   Past Surgical History:  Procedure Laterality Date  . APPENDECTOMY    . BACK SURGERY    . CHOLECYSTECTOMY    . EYE SURGERY    . INCISE AND DRAIN ABCESS Left 09/16/2015   forearm; debscess undere MAC; [facility]; Dr Curley Spice  . INCISION AND DRAINAGE ABSCESS Left 09/19/15   forearm wound washout with [facility]avac at [facility] by Dr Curley Spice    Allergies  Allergen Reactions  . Influenza Vaccines Hives  . Aspirin Other (See Comments)    DIZZINESS, UNCONSCIOUSNESS 09/16/15 pt takes 81 mg aspirin at home.[name], RPh  . Cyclobenzaprine Hives  . Dextromethorphan Other (See Comments)    Unknown   . Doxycycline Nausea Only  . Flu Virus Vaccine   . Gabapentin Other (See Comments)    GI UPSET,DROWSINESS  . Glipizide Other (See Comments)    DIZZINESS  . Hydrocodone-Acetaminophen Other (See Comments)    Unknown   . Iodinated Diagnostic Agents Other (See Comments)    Unknown  Unknown   . Latex Other (See Comments)    Unknown   . Lisinopril Other (See Comments)    COUGH  . Pregabalin Other (See Comments)    GI UPSET,DROWSINESS  . Ropinirole Nausea Only  . Salicylates Other (  See Comments)    Unknown   . Sulfamethoxazole-Trimethoprim Nausea Only  . Oxycodone-Acetaminophen Hives and Nausea Only  . Penicillins Hives, Itching and Rash    Has patient had a PCN reaction causing immediate rash, facial/tongue/throat swelling, SOB or lightheadedness with hypotension: Yes Has patient had a PCN reaction causing severe rash involving mucus membranes or skin necrosis: No Has patient had a PCN reaction that required hospitalization: No Has patient had a PCN reaction occurring within the last  10 years: No If all of the above answers are "NO", then may proceed with Cephalosporin use.     Outpatient Encounter Medications as of 05/28/2020  Medication Sig  . acetaminophen (TYLENOL) 325 MG tablet Take 650 mg by mouth every 8 (eight) hours as needed for mild pain, moderate pain or headache.   Marland Kitchen acetaminophen (TYLENOL) 325 MG tablet Take 650 mg by mouth in the morning and at bedtime.   Marland Kitchen allopurinol (ZYLOPRIM) 100 MG tablet Take 1 tablet (100 mg total) by mouth daily.  Marland Kitchen azelastine (ASTELIN) 0.1 % nasal spray Place 1 spray into both nostrils 2 (two) times daily as needed for rhinitis or allergies.  . bisacodyl (DULCOLAX) 10 MG suppository If not relieved by MOM, give 10 mg Bisacodyl suppositiory rectally X 1 dose in 24 hours as needed (Do not use constipation standing orders for residents with renal failure/CFR less than 30. Contact MD for orders) (Physician Order)  . dasatinib (SPRYCEL) 50 MG tablet Take 50 mg by mouth daily.  . ergocalciferol (VITAMIN D2) 1.25 MG (50000 UT) capsule Take 1 capsule (50,000 Units total) by mouth once a week. FOR VITAMIN D DEFICIENCY (DO NOT CRUSH)  . ferrous sulfate 325 (65 FE) MG tablet Take 325 mg by mouth daily with breakfast.  . finasteride (PROSCAR) 5 MG tablet Take 1 tablet (5 mg total) by mouth daily.  . furosemide (LASIX) 20 MG tablet Take 1 tablet (20 mg total) by mouth daily.  . hydrocortisone cream 1 % Apply 1 application topically. APPLY TOPICALLY TO EXTERNALLY HEMORRHOIDS/ANAL AREA TWICE DAILY AS NEEDED  . insulin lispro (HUMALOG) 100 UNIT/ML KwikPen Inject 5 Units into the skin as needed (For CBG >200).  Marland Kitchen loperamide (IMODIUM A-D) 2 MG tablet Take 4 mg by mouth. Give 4 mg after first loose stool prn  . losartan (COZAAR) 25 MG tablet Take 1 tablet (25 mg total) by mouth daily.  . magnesium hydroxide (MILK OF MAGNESIA) 400 MG/5ML suspension If no BM in 3 days, give 30 cc Milk of Magnesium p.o. x 1 dose in 24 hours as needed (Do not use standing  constipation orders for residents with renal failure CFR less than 30. Contact MD for orders) (Physician Order)  . melatonin 3 MG TABS tablet Take 1 tablet (3 mg total) by mouth at bedtime.  . metoprolol succinate (TOPROL-XL) 50 MG 24 hr tablet Take 1 tablet (50 mg total) by mouth daily. Take with or immediately following a meal.  . nitroGLYCERIN (NITROSTAT) 0.4 MG SL tablet Place 1 tablet (0.4 mg total) under the tongue every 5 (five) minutes as needed for chest pain.  . NON FORMULARY MECHANICAL SOFT NAS  . Nutritional Supplement LIQD Take 120 mLs by mouth in the morning and at bedtime. MedPass  . ondansetron (ZOFRAN) 4 MG tablet Take 4 mg by mouth every 6 (six) hours as needed for nausea or vomiting.  . Sodium Phosphates (RA SALINE ENEMA RE) If not relieved by Biscodyl suppository, give disposable Saline Enema rectally X 1 dose/24  hrs as needed (Do not use constipation standing orders for residents with renal failure/CFR less than 30. Contact MD for orders)(Physician Or  . tamsulosin (FLOMAX) 0.4 MG CAPS capsule Take 1 capsule (0.4 mg total) by mouth daily.   No facility-administered encounter medications on file as of 05/28/2020.    Review of Systems  GENERAL: No change in appetite, no fatigue, no weight changes, no fever, chills or weakness MOUTH and THROAT: Denies oral discomfort, gingival pain or bleeding RESPIRATORY: no cough, SOB, DOE, wheezing, hemoptysis CARDIAC: No chest pain or palpitations GI: No abdominal pain, diarrhea, constipation, heart burn, nausea or vomiting GU: Denies dysuria, frequency, hematuria, incontinence, or discharge NEUROLOGICAL: Denies dizziness, syncope, numbness, or headache PSYCHIATRIC: Denies feelings of depression or anxiety. No report of hallucinations, insomnia, paranoia, or agitation   Immunization History  Administered Date(s) Administered  . Influenza, Seasonal, Injecte, Preservative Fre 08/21/2012  . Influenza-Unspecified 08/21/2012  . Moderna  SARS-COVID-2 Vaccination 12/24/2019  . Pneumococcal Conjugate-13 02/28/2015, 11/25/2015  . Pneumococcal Polysaccharide-23 08/26/2016   Pertinent  Health Maintenance Due  Topic Date Due  . FOOT EXAM  Never done  . OPHTHALMOLOGY EXAM  Never done  . INFLUENZA VACCINE  03/16/2020  . HEMOGLOBIN A1C  07/04/2020  . PNA vac Low Risk Adult  Completed    Vitals:   05/28/20 0900  BP: (!) 105/48  Pulse: 78  Resp: 16  Temp: 97.9 F (36.6 C)  TempSrc: Oral  Weight: 164 lb 6.4 oz (74.6 kg)  Height: _0  (1.854 m)   Body mass index is 21.69 kg/m.  Physical Exam  GENERAL APPEARANCE: Well nourished. In no acute distress. Normal body habitus SKIN:  Skin is warm and dry.  MOUTH and THROAT: Lips are without lesions. Oral mucosa is moist and without lesions. Tongue is normal in shape, size, and color and without lesions RESPIRATORY: Breathing is even & unlabored, BS CTAB CARDIAC: RRR, no murmur,no extra heart sounds, 1+bilateral feet edema GI: Abdomen soft, normal BS, no masses, no tenderness EXTREMITIES:  Able to move X 4 extremities. NEUROLOGICAL: There is no tremor. Speech is clear. Alert t placeo self and time, disoriented to PSYCHIATRIC:  Affect and behavior are appropriate  Labs reviewed: Recent Labs    01/10/20 0000 01/29/20 0000 02/06/20 0000  NA 136* 134* 132*  K 4.1 4.0 4.2  CL 108 109* 105  CO2 _1 BUN 65* 70* 70*  CREATININE 1.5* 1.5* 1.5*  CALCIUM 9.0 8.5* 8.6*   Recent Labs    01/02/20 0000 01/10/20 0000  AST 36 34  ALT Jerry* 45*  ALKPHOS 93 96  ALBUMIN 3.8 4.0   Recent Labs    01/02/20 0000 02/06/20 0000  WBC 7.0 21.2  NEUTROABS 3 17  HGB 9.6* 9.1*  HCT 29* 27*  PLT 172 258   Lab Results  Component Value Date   TSH 5.20 01/02/2020   Lab Results  Component Value Date   HGBA1C 7.6 01/02/2020    Assessment/Plan   1. Advance care planning - remains to be full code -  Discussed medications, vital signs and weights  2. Essential (primary)  hypertension - stable, continue Losartan and Metoprolol succinate ER -  Monitor BPs  3. Type 2 diabetes mellitus with stage 3b chronic kidney disease, with long-term current use of insulin (HCC) Lab Results  Component Value Date   HGBA1C 7.6 01/02/2020   -  CBGs ranging from 114 to 253, stable -  Continue Humalog insulin  4. Benign fibroma  of prostate - denies urinary retention, continue Tamsulosin and Finasteride  5. 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 Chronic diastolic heart failure -  Stable, continue Furosemide and Metoprolol succinate ER     Family/ staff Communication:  Discussed plan of care with resident and IDT.  Labs/tests ordered:  None  Goals of care:   Long-term care   Durenda Age, DNP, MSN, FNP-BC [facility] [phone] (Monday-Friday 8:00 a.m. - 5:00 p.m.) [phone] (after hours)

## 2020-06-12 ENCOUNTER — Encounter: Payer: Self-pay | Admitting: Adult Health

## 2020-06-12 ENCOUNTER — Non-Acute Institutional Stay (INDEPENDENT_AMBULATORY_CARE_PROVIDER_SITE_OTHER): Payer: Medicare Other | Admitting: Adult Health

## 2020-06-12 DIAGNOSIS — Z0000 Encounter for general adult medical examination without abnormal findings: Secondary | ICD-10-CM

## 2020-06-12 NOTE — Progress Notes (Signed)
Subjective:   Jerry [name] is a 79 y.o. male who presents for Medicare Annual/Subsequent preventive examination.  Review of Systems     Cardiac Risk Factors include: advanced age (>31mn, >>43women);diabetes mellitus;hypertension;male gender       Objective:    Today's Vitals   06/12/20 1109  BP: 123/72  Pulse: 77  Resp: 18  Temp: 97.7 F (36.5 C)  TempSrc: Oral  Weight: 161 lb 9.6 oz (73.3 kg)  Height: _0  (1.854 m)   Body mass index is 21.32 kg/m.  Advanced Directives 06/12/2020 03/28/2020 03/17/2020 05/15/2019 04/26/2019 04/26/2019 04/16/2019  Does Patient Have a Medical Advance Directive? No _1  Yes  Type of Advance Directive - (No Data) (No Data) Out of facility DNR (pink MOST or yellow form) Out of facility DNR (pink MOST or yellow form) Out of facility DNR (pink MOST or yellow form) -  Does patient want to make changes to medical advance directive? - No - Patient declined No - Patient declined No - Patient declined No - Patient declined No - Patient declined -  Would patient like information on creating a medical advance directive? No - Patient declined - - - - - -  Pre-existing out of facility DNR order (yellow form or pink MOST form) - - - - - - -    Current Medications (verified) Outpatient Encounter Medications as of 06/12/2020  Medication Sig  . acetaminophen (TYLENOL) 325 MG tablet Take 650 mg by mouth every 8 (eight) hours as needed for mild pain, moderate pain or headache.   .Marland Kitchenacetaminophen (TYLENOL) 325 MG tablet Take 650 mg by mouth in the morning and at bedtime.   .Marland Kitchenallopurinol (ZYLOPRIM) 100 MG tablet Take 1 tablet (100 mg total) by mouth daily.  .Marland Kitchenazelastine (ASTELIN) 0.1 % nasal spray Place 1 spray into both nostrils 2 (two) times daily as needed for rhinitis or allergies.  . bisacodyl (DULCOLAX) 10 MG suppository If not relieved by MOM, give 10 mg Bisacodyl suppositiory rectally X 1 dose in 24 hours as needed (Do not use constipation  standing orders for residents with renal failure/CFR less than 30. Contact MD for orders) (Physician Order)  . dasatinib (SPRYCEL) 50 MG tablet Take 50 mg by mouth daily.  . ergocalciferol (VITAMIN D2) 1.25 MG (50000 UT) capsule Take 1 capsule (50,000 Units total) by mouth once a week. FOR VITAMIN D DEFICIENCY (DO NOT CRUSH)  . ferrous sulfate 325 (65 FE) MG tablet Take 325 mg by mouth daily with breakfast.  . finasteride (PROSCAR) 5 MG tablet Take 1 tablet (5 mg total) by mouth daily.  . furosemide (LASIX) 20 MG tablet Take 1 tablet (20 mg total) by mouth daily.  . hydrocortisone cream 1 % Apply 1 application topically. APPLY TOPICALLY TO EXTERNALLY HEMORRHOIDS/ANAL AREA TWICE DAILY AS NEEDED  . insulin lispro (HUMALOG) 100 UNIT/ML KwikPen Inject 5 Units into the skin as needed (For CBG >200).  .Marland Kitchenloperamide (IMODIUM A-D) 2 MG tablet Take 2-4 mg by mouth as needed for diarrhea or loose stools. Give 4 mg after first loose stool, 2 mg after initial dose  . losartan (COZAAR) 25 MG tablet Take 1 tablet (25 mg total) by mouth daily.  . magnesium hydroxide (MILK OF MAGNESIA) 400 MG/5ML suspension If no BM in 3 days, give 30 cc Milk of Magnesium p.o. x 1 dose in 24 hours as needed (Do not use standing constipation orders for residents with renal failure CFR less than 30.  Contact MD for orders) (Physician Order)  . melatonin 3 MG TABS tablet Take 1 tablet (3 mg total) by mouth at bedtime.  . metoprolol succinate (TOPROL-XL) 50 MG 24 hr tablet Take 1 tablet (50 mg total) by mouth daily. Take with or immediately following a meal.  . nitroGLYCERIN (NITROSTAT) 0.4 MG SL tablet Place 1 tablet (0.4 mg total) under the tongue every 5 (five) minutes as needed for chest pain.  . NON FORMULARY MECHANICAL SOFT NAS  . Nutritional Supplement LIQD Take 120 mLs by mouth in the morning and at bedtime. [employer]  . ondansetron (ZOFRAN) 4 MG tablet Take 4 mg by mouth every 6 (six) hours as needed for nausea or vomiting.  .  Sodium Phosphates (RA SALINE ENEMA RE) If not relieved by Biscodyl suppository, give disposable Saline Enema rectally X 1 dose/24 hrs as needed (Do not use constipation standing orders for residents with renal failure/CFR less than 30. Contact MD for orders)(Physician Or  . tamsulosin (FLOMAX) 0.4 MG CAPS capsule Take 1 capsule (0.4 mg total) by mouth daily.   No facility-administered encounter medications on file as of 06/12/2020.    Allergies (verified) Influenza vaccines, Aspirin, Cyclobenzaprine, Dextromethorphan, Doxycycline, Flu virus vaccine, Gabapentin, Glipizide, Hydrocodone-acetaminophen, Iodinated diagnostic agents, Latex, Lisinopril, Pregabalin, Ropinirole, Salicylates, Sulfamethoxazole-trimethoprim, Oxycodone-acetaminophen, and Penicillins   History: Past Medical History:  Diagnosis Date  . Diabetes mellitus with neuropathy ([place])   . Hyperlipidemia   . Hypertension   . Leukemia ([place])    on [place]  . MRSA infection 09/19/2015   left arm  . Urinary retention 01/11/2014   Past Surgical History:  Procedure Laterality Date  . APPENDECTOMY    . BACK SURGERY    . CHOLECYSTECTOMY    . EYE SURGERY    . INCISE AND DRAIN ABCESS Left 09/16/2015   forearm; debscess undere MAC; [facility]; Dr Curley Spice  . INCISION AND DRAINAGE ABSCESS Left 09/19/15   forearm wound washout with pulavac at [facility] by Dr Curley Spice   Family History  Problem Relation Age of Onset  . Heart attack Other   . Diabetes Other   . Hypertension Other    Social History   Socioeconomic History  . Marital status: Married    Spouse name: Not on file  . Number of children: Not on file  . Years of education: Not on file  . Highest education level: Not on file  Occupational History  . Not on file  Tobacco Use  . Smoking status: Former Research scientist (life sciences)  . Smokeless tobacco: Never Used  Vaping Use  . Vaping Use: Never used  Substance and Sexual Activity  . Alcohol use: No    Alcohol/week: 0.0 standard  drinks  . Drug use: No  . Sexual activity: Not on file  Other Topics Concern  . Not on file  Social History Narrative   Lives alone   Social Determinants of Health   Financial Resource Strain:   . Difficulty of Paying Living Expenses: Not on file  Food Insecurity:   . Worried About Charity fundraiser in the Last Year: Not on file  . Ran Out of Food in the Last Year: Not on file  Transportation Needs:   . Lack of Transportation (Medical): Not on file  . Lack of Transportation (Non-Medical): Not on file  Physical Activity:   . Days of Exercise per Week: Not on file  . Minutes of Exercise per Session: Not on file  Stress:   . Feeling of Stress :  Not on file  Social Connections:   . Frequency of Communication with Friends and Family: Not on file  . Frequency of Social Gatherings with Friends and Family: Not on file  . Attends Religious Services: Not on file  . Active Member of Clubs or Organizations: Not on file  . Attends Archivist Meetings: Not on file  . Marital Status: Not on file    Tobacco Counseling Counseling given: Not Answered   Clinical Intake:  Pre-visit preparation completed: No  Pain : No/denies pain     BMI - recorded: 21.32 Nutritional Status: BMI of 19-24  Normal Nutritional Risks: None Diabetes: Yes CBG done?: Yes (218) CBG resulted in Enter/ Edit results?: Yes Did pt. bring in CBG monitor from home?: Yes (Lives LTC) Glucose Meter Downloaded?: Yes  How often do you need to have someone help you when you read instructions, pamphlets, or other written materials from your doctor or pharmacy?: 4 - Often What is the last grade level you completed in school?: 11th grade  Diabetic? Yes  Interpreter Needed?: No  Information entered by :: [place] of Daily Living In your present state of health, do you have any difficulty performing the following activities: 06/12/2020  Hearing? N  Vision? N  Difficulty  concentrating or making decisions? N  Walking or climbing stairs? Y  Dressing or bathing? Y  Doing errands, shopping? N  Comment Lives in LTC  Preparing Food and eating ? N  Comment Lives in LTC  Using the Toilet? N  In the past six months, have you accidently leaked urine? Y  Do you have problems with loss of bowel control? Y  Managing your Medications? Y  Comment Lives in LTC  Managing your Finances? Y  Housekeeping or managing your Housekeeping? Y  Some recent data might be hidden    Patient Care Team: Hendricks Limes, MD as PCP - General (Internal Medicine) Medina-Vargas, Senaida Lange, NP as Nurse Practitioner (Internal Medicine) Rehab, [place] ([place])  Indicate any recent Medical Services you may have received from other than [facility] providers in the past year (date may be approximate).     Assessment:   This is a routine wellness examination for Jerry [name].  Hearing/Vision screen  Hearing Screening   _0  _1  _2  _3  _4  _5  _6  _7  _8   Right ear:           Left ear:           Comments: Not done  Vision Screening Comments: Not done  Dietary issues and exercise activities discussed: Current Exercise Habits: Structured exercise class, Type of exercise: stretching, Time (Minutes): 10, Frequency (Times/Week): 3, Weekly Exercise (Minutes/Week): 30, Intensity: Mild, Exercise limited by: cardiac condition(s)  Goals    .  LIFESTYLE - DECREASE FALLS RISK (pt-stated)      -will need to have small wedge on hip to prevent pain  -  will ask for assistance when needed      Depression Screen PHQ 2/9 Scores 06/12/2020  PHQ - 2 Score 1  PHQ- 9 Score 5    Fall Risk Fall Risk  06/12/2020  Falls in the past year? 1  Number falls in past yr: 1  Injury with Fall? 0  Risk for fall due to : History of fall(s);Impaired balance/gait;Impaired mobility  Follow up Falls evaluation completed;Education provided;Falls prevention discussed      Any stairs in or around the home? No  If so, are  there any without handrails? No  Home free of loose throw rugs in walkways, pet beds, electrical cords, etc? Yes  Adequate lighting in your home to reduce risk of falls? Yes   ASSISTIVE DEVICES UTILIZED TO PREVENT FALLS:  Life alert? No  Use of a cane, walker or w/c? Yes  Grab bars in the bathroom? Yes  Shower chair or bench in shower? Yes  Elevated toilet seat or a handicapped toilet? Yes   TIMED UP AND GO:  Was the test performed? No .  Length of time to ambulate N/A  Gait unsteady with use of assistive device, provider informed and education provided.   Cognitive Function:     6CIT Screen 06/12/2020  What Year? 0 points  What month? 0 points  What time? 0 points  Count back from 20 0 points  Months in reverse 2 points  Repeat phrase 10 points  Total Score 12    Immunizations Immunization History  Administered Date(s) Administered  . Influenza, Seasonal, Injecte, Preservative Fre 08/21/2012  . Influenza-Unspecified 08/21/2012  . Moderna SARS-COVID-2 Vaccination 12/24/2019  . Pneumococcal Conjugate-13 02/28/2015, 11/25/2015  . Pneumococcal Polysaccharide-23 08/26/2016    TDAP status: Due, Education has been provided regarding the importance of this vaccine. Advised may receive this vaccine at local pharmacy or Health Dept. Aware to provide a copy of the vaccination record if obtained from local pharmacy or Health Dept. Verbalized acceptance and understanding. Flu Vaccine status: Up to date Pneumococcal vaccine status: Up to date Covid-19 vaccine status: Completed vaccines  Qualifies for Shingles Vaccine? Yes   Zostavax completed No   Shingrix Completed?: No.    Education has been provided regarding the importance of this vaccine. Patient has been advised to call insurance company to determine out of pocket expense if they have not yet received this vaccine. Advised may also receive vaccine at local pharmacy or  Health Dept. Verbalized acceptance and understanding.  Screening Tests Health Maintenance  Topic Date Due  . Hepatitis C Screening  Never done  . FOOT EXAM  Never done  . OPHTHALMOLOGY EXAM  Never done  . COVID-19 Vaccine (2 - Moderna 2-dose series) 01/21/2020  . INFLUENZA VACCINE  11/13/2020 (Originally 03/16/2020)  . TETANUS/TDAP  08/17/2023 (Originally 08/14/1960)  . HEMOGLOBIN A1C  07/04/2020  . PNA vac Low Risk Adult  Completed    Health Maintenance  Health Maintenance Due  Topic Date Due  . Hepatitis C Screening  Never done  . FOOT EXAM  Never done  . OPHTHALMOLOGY EXAM  Never done  . COVID-19 Vaccine (2 - Moderna 2-dose series) 01/21/2020    Colorectal cancer screening: Completed ordered. Repeat every 1 years  Lung Cancer Screening: (Low Dose CT Chest recommended if Age 60-80 years, 30 pack-year currently smoking OR have quit w/in 15years.) does not qualify.   Lung Cancer Screening Referral: No  Additional Screening:  Hepatitis C Screening: does qualify; Completed Ordered  Vision Screening: Recommended annual ophthalmology exams for early detection of glaucoma and other disorders of the eye. Is the patient up to date with their annual eye exam?  No  Who is the provider or what is the name of the office in which the patient attends annual eye exams? None If pt is not established with a provider, would they like to be referred to a provider to establish care? Yes .   Dental Screening: Recommended annual dental exams for proper oral hygiene  Community Resource Referral / Chronic Care Management: CRR required this visit?  Yes   CCM required this visit?  Yes      Plan:     I have personally reviewed and noted the following in the patient's chart:   . Medical and social history . Use of alcohol, tobacco or illicit drugs  . Current medications and supplements . Functional ability and status . Nutritional status . Physical activity . Advanced  directives . List of other physicians . Hospitalizations, surgeries, and ER visits in previous 12 months . Vitals . Screenings to include cognitive, depression, and falls . Referrals and appointments  In addition, I have reviewed and discussed with patient certain preventive protocols, quality metrics, and best practice recommendations. A written personalized care plan for preventive services as well as general preventive health recommendations were provided to patient.     Augusten Lipkin Medina-Vargas, NP   06/12/2020

## 2020-08-16 DEATH — deceased
